# Patient Record
Sex: Female | Born: 1960 | Race: Black or African American | Marital: Married | Smoking: Never smoker
Health system: Southern US, Community
[De-identification: ages and names within clinical notes are randomized; demographics above are authoritative.]

## PROBLEM LIST (undated history)

## (undated) DIAGNOSIS — R519 Headache, unspecified: Secondary | ICD-10-CM

## (undated) DIAGNOSIS — S0990XA Unspecified injury of head, initial encounter: Secondary | ICD-10-CM

## (undated) DIAGNOSIS — I10 Essential (primary) hypertension: Secondary | ICD-10-CM

## (undated) DIAGNOSIS — R7302 Impaired glucose tolerance (oral): Secondary | ICD-10-CM

## (undated) DIAGNOSIS — M542 Cervicalgia: Secondary | ICD-10-CM

## (undated) DIAGNOSIS — D509 Iron deficiency anemia, unspecified: Secondary | ICD-10-CM

## (undated) DIAGNOSIS — F419 Anxiety disorder, unspecified: Secondary | ICD-10-CM

## (undated) DIAGNOSIS — Z8709 Personal history of other diseases of the respiratory system: Secondary | ICD-10-CM

## (undated) DIAGNOSIS — E669 Obesity, unspecified: Secondary | ICD-10-CM

## (undated) DIAGNOSIS — M1712 Unilateral primary osteoarthritis, left knee: Secondary | ICD-10-CM

## (undated) DIAGNOSIS — R51 Headache: Secondary | ICD-10-CM

## (undated) DIAGNOSIS — N3000 Acute cystitis without hematuria: Secondary | ICD-10-CM

## (undated) DIAGNOSIS — R300 Dysuria: Secondary | ICD-10-CM

## (undated) DIAGNOSIS — M549 Dorsalgia, unspecified: Secondary | ICD-10-CM

## (undated) HISTORY — DX: Iron deficiency anemia, unspecified: D50.9

## (undated) HISTORY — DX: Personal history of other diseases of the respiratory system: Z87.09

## (undated) HISTORY — DX: Acute cystitis without hematuria: N30.00

## (undated) HISTORY — PX: CHOLECYSTECTOMY: SHX55

## (undated) HISTORY — DX: Headache: R51

## (undated) HISTORY — DX: Obesity, unspecified: E66.9

## (undated) HISTORY — DX: Impaired glucose tolerance (oral): R73.02

## (undated) HISTORY — DX: Unspecified injury of head, initial encounter: S09.90XA

## (undated) HISTORY — PX: ABDOMINAL HYSTERECTOMY: SHX81

## (undated) HISTORY — DX: Dorsalgia, unspecified: M54.9

## (undated) HISTORY — DX: Dysuria: R30.0

## (undated) HISTORY — DX: Headache, unspecified: R51.9

## (undated) HISTORY — DX: Cervicalgia: M54.2

## (undated) HISTORY — DX: Unilateral primary osteoarthritis, left knee: M17.12

---

## 1998-05-17 ENCOUNTER — Encounter: Admission: RE | Admit: 1998-05-17 | Discharge: 1998-08-15 | Payer: Self-pay | Admitting: Anesthesiology

## 1998-11-25 ENCOUNTER — Encounter: Admission: RE | Admit: 1998-11-25 | Discharge: 1999-02-23 | Payer: Self-pay | Admitting: Anesthesiology

## 2001-02-21 ENCOUNTER — Other Ambulatory Visit: Admission: RE | Admit: 2001-02-21 | Discharge: 2001-02-21 | Payer: Self-pay | Admitting: Family Medicine

## 2001-03-06 ENCOUNTER — Ambulatory Visit (HOSPITAL_COMMUNITY): Admission: RE | Admit: 2001-03-06 | Discharge: 2001-03-06 | Payer: Self-pay | Admitting: Family Medicine

## 2001-03-06 ENCOUNTER — Encounter: Payer: Self-pay | Admitting: Family Medicine

## 2001-04-01 ENCOUNTER — Encounter: Payer: Self-pay | Admitting: Family Medicine

## 2001-04-01 ENCOUNTER — Ambulatory Visit (HOSPITAL_COMMUNITY): Admission: RE | Admit: 2001-04-01 | Discharge: 2001-04-01 | Payer: Self-pay | Admitting: Family Medicine

## 2001-06-16 ENCOUNTER — Encounter
Admission: RE | Admit: 2001-06-16 | Discharge: 2001-09-14 | Payer: Self-pay | Admitting: Physical Medicine & Rehabilitation

## 2001-09-15 ENCOUNTER — Encounter
Admission: RE | Admit: 2001-09-15 | Discharge: 2001-10-15 | Payer: Self-pay | Admitting: Physical Medicine & Rehabilitation

## 2001-10-14 ENCOUNTER — Encounter: Payer: Self-pay | Admitting: Family Medicine

## 2001-10-14 ENCOUNTER — Ambulatory Visit (HOSPITAL_COMMUNITY): Admission: RE | Admit: 2001-10-14 | Discharge: 2001-10-14 | Payer: Self-pay | Admitting: Family Medicine

## 2002-10-14 ENCOUNTER — Encounter: Payer: Self-pay | Admitting: Orthopaedic Surgery

## 2002-10-14 ENCOUNTER — Ambulatory Visit (HOSPITAL_COMMUNITY): Admission: RE | Admit: 2002-10-14 | Discharge: 2002-10-14 | Payer: Self-pay | Admitting: Orthopaedic Surgery

## 2003-08-30 ENCOUNTER — Ambulatory Visit (HOSPITAL_COMMUNITY): Admission: RE | Admit: 2003-08-30 | Discharge: 2003-08-30 | Payer: Self-pay | Admitting: Family Medicine

## 2004-02-03 ENCOUNTER — Ambulatory Visit (HOSPITAL_COMMUNITY): Admission: RE | Admit: 2004-02-03 | Discharge: 2004-02-03 | Payer: Self-pay | Admitting: Family Medicine

## 2004-02-23 ENCOUNTER — Ambulatory Visit (HOSPITAL_COMMUNITY): Admission: RE | Admit: 2004-02-23 | Discharge: 2004-02-23 | Payer: Self-pay | Admitting: Family Medicine

## 2004-07-04 ENCOUNTER — Ambulatory Visit: Payer: Self-pay | Admitting: Family Medicine

## 2004-10-16 ENCOUNTER — Ambulatory Visit: Payer: Self-pay | Admitting: Family Medicine

## 2004-10-16 LAB — CONVERTED CEMR LAB: Hgb A1c MFr Bld: 6.1 %

## 2004-10-19 ENCOUNTER — Ambulatory Visit (HOSPITAL_COMMUNITY): Admission: RE | Admit: 2004-10-19 | Discharge: 2004-10-19 | Payer: Self-pay | Admitting: Family Medicine

## 2004-12-25 ENCOUNTER — Ambulatory Visit: Payer: Self-pay | Admitting: Family Medicine

## 2005-04-24 ENCOUNTER — Ambulatory Visit: Payer: Self-pay | Admitting: Family Medicine

## 2005-05-01 ENCOUNTER — Ambulatory Visit (HOSPITAL_COMMUNITY): Admission: RE | Admit: 2005-05-01 | Discharge: 2005-05-01 | Payer: Self-pay | Admitting: Family Medicine

## 2005-11-13 ENCOUNTER — Encounter: Payer: Self-pay | Admitting: Family Medicine

## 2005-11-13 ENCOUNTER — Ambulatory Visit: Payer: Self-pay | Admitting: Family Medicine

## 2005-11-13 ENCOUNTER — Other Ambulatory Visit: Admission: RE | Admit: 2005-11-13 | Discharge: 2005-11-13 | Payer: Self-pay | Admitting: Family Medicine

## 2006-01-16 ENCOUNTER — Ambulatory Visit: Payer: Self-pay | Admitting: Family Medicine

## 2006-01-24 ENCOUNTER — Ambulatory Visit (HOSPITAL_COMMUNITY): Admission: RE | Admit: 2006-01-24 | Discharge: 2006-01-24 | Payer: Self-pay | Admitting: Family Medicine

## 2006-03-14 ENCOUNTER — Ambulatory Visit (HOSPITAL_COMMUNITY): Admission: RE | Admit: 2006-03-14 | Discharge: 2006-03-14 | Payer: Self-pay | Admitting: Obstetrics and Gynecology

## 2006-03-25 HISTORY — PX: PARTIAL HYSTERECTOMY: SHX80

## 2006-04-18 ENCOUNTER — Ambulatory Visit: Payer: Self-pay | Admitting: Family Medicine

## 2006-04-22 ENCOUNTER — Ambulatory Visit: Payer: Self-pay | Admitting: Family Medicine

## 2006-04-30 ENCOUNTER — Encounter (HOSPITAL_COMMUNITY): Admission: RE | Admit: 2006-04-30 | Discharge: 2006-05-30 | Payer: Self-pay | Admitting: Family Medicine

## 2006-08-19 ENCOUNTER — Ambulatory Visit: Payer: Self-pay | Admitting: Family Medicine

## 2006-08-19 LAB — CONVERTED CEMR LAB
Albumin: 3.8 g/dL (ref 3.5–5.2)
Alkaline Phosphatase: 80 units/L (ref 39–117)
BUN: 8 mg/dL
BUN: 8 mg/dL (ref 6–23)
CO2: 25 meq/L
CO2: 25 meq/L (ref 19–32)
Calcium: 9 mg/dL
Calcium: 9 mg/dL (ref 8.4–10.5)
Chloride: 107 meq/L (ref 96–112)
Creatinine, Ser: 0.72 mg/dL (ref 0.40–1.20)
Eosinophils Absolute: 0.2 10*3/uL (ref 0.0–0.7)
Eosinophils Relative: 2 % (ref 0–5)
Ferritin: 14 ng/mL (ref 10–291)
Glucose, Bld: 91 mg/dL (ref 70–99)
HCT: 35.1 % — ABNORMAL LOW (ref 36.0–46.0)
HDL: 38 mg/dL — ABNORMAL LOW (ref >39)
Hemoglobin: 10.9 g/dL — ABNORMAL LOW (ref 12.0–15.0)
LDL Cholesterol: 87 mg/dL (ref 0–99)
Lymphocytes Relative: 27 % (ref 12–46)
Lymphs Abs: 2 10*3/uL (ref 0.7–3.3)
MCHC: 31.1 g/dL (ref 30.0–36.0)
Monocytes Relative: 7 % (ref 3–11)
Potassium: 4.3 meq/L (ref 3.5–5.3)
RBC: 4.37 M/uL (ref 3.87–5.11)
Saturation Ratios: 12 % — ABNORMAL LOW (ref 20–55)
Sodium: 143 meq/L
TSH: 1.23 microintl units/mL (ref 0.350–5.50)
Total Bilirubin: 0.3 mg/dL (ref 0.3–1.2)
Total CHOL/HDL Ratio: 3.9
Triglycerides: 113 mg/dL (ref <150)
UIBC: 276 ug/dL

## 2006-08-20 ENCOUNTER — Ambulatory Visit (HOSPITAL_COMMUNITY): Admission: RE | Admit: 2006-08-20 | Discharge: 2006-08-20 | Payer: Self-pay | Admitting: Family Medicine

## 2006-08-20 LAB — HM MAMMOGRAPHY: HM Mammogram: NORMAL

## 2006-09-21 ENCOUNTER — Emergency Department (HOSPITAL_COMMUNITY): Admission: EM | Admit: 2006-09-21 | Discharge: 2006-09-22 | Payer: Self-pay | Admitting: Emergency Medicine

## 2006-09-24 ENCOUNTER — Ambulatory Visit: Payer: Self-pay | Admitting: Family Medicine

## 2006-10-12 ENCOUNTER — Encounter: Payer: Self-pay | Admitting: Family Medicine

## 2006-10-19 ENCOUNTER — Encounter: Payer: Self-pay | Admitting: Family Medicine

## 2006-11-14 ENCOUNTER — Encounter (INDEPENDENT_AMBULATORY_CARE_PROVIDER_SITE_OTHER): Payer: Self-pay | Admitting: *Deleted

## 2006-11-14 ENCOUNTER — Other Ambulatory Visit: Admission: RE | Admit: 2006-11-14 | Discharge: 2006-11-14 | Payer: Self-pay | Admitting: Family Medicine

## 2006-11-14 ENCOUNTER — Ambulatory Visit: Payer: Self-pay | Admitting: Family Medicine

## 2006-11-14 ENCOUNTER — Encounter: Payer: Self-pay | Admitting: Family Medicine

## 2006-11-14 LAB — CONVERTED CEMR LAB
Basophils Absolute: 0 10*3/uL (ref 0.0–0.1)
Eosinophils Absolute: 0.1 10*3/uL (ref 0.0–0.7)
HCT: 35.3 % — ABNORMAL LOW (ref 36.0–46.0)
Hemoglobin: 11.3 g/dL — ABNORMAL LOW (ref 12.0–15.0)
Lymphocytes Relative: 32 % (ref 12–46)
Monocytes Relative: 7 % (ref 3–11)
Neutro Abs: 3.7 10*3/uL (ref 1.7–7.7)
Neutrophils Relative %: 59 % (ref 43–77)
RBC: 4.5 M/uL (ref 3.87–5.11)
RDW: 15.3 % — ABNORMAL HIGH (ref 11.5–14.0)
Retic Ct Pct: 1.1 % (ref 0.4–3.1)
Saturation Ratios: 15 % — ABNORMAL LOW (ref 20–55)
TIBC: 276 ug/dL (ref 250–470)
UIBC: 235 ug/dL
Vitamin B-12: 299 pg/mL (ref 211–911)
WBC: 6.2 10*3/uL (ref 4.0–10.5)

## 2006-11-15 ENCOUNTER — Encounter: Payer: Self-pay | Admitting: Family Medicine

## 2006-11-15 LAB — CONVERTED CEMR LAB
GC Probe Amp, Genital: NEGATIVE
Trichomonal Vaginitis: NEGATIVE

## 2006-12-23 ENCOUNTER — Ambulatory Visit: Payer: Self-pay | Admitting: Family Medicine

## 2006-12-23 LAB — CONVERTED CEMR LAB
Bilirubin Urine: NEGATIVE
Nitrite: POSITIVE
Specific Gravity, Urine: 1.01

## 2006-12-24 ENCOUNTER — Encounter: Payer: Self-pay | Admitting: Family Medicine

## 2007-07-08 ENCOUNTER — Encounter: Payer: Self-pay | Admitting: Family Medicine

## 2007-07-08 DIAGNOSIS — IMO0002 Reserved for concepts with insufficient information to code with codable children: Secondary | ICD-10-CM

## 2007-07-08 DIAGNOSIS — Z9189 Other specified personal risk factors, not elsewhere classified: Secondary | ICD-10-CM | POA: Insufficient documentation

## 2007-07-08 DIAGNOSIS — M549 Dorsalgia, unspecified: Secondary | ICD-10-CM | POA: Insufficient documentation

## 2007-07-08 DIAGNOSIS — E66811 Obesity, class 1: Secondary | ICD-10-CM | POA: Insufficient documentation

## 2007-07-08 DIAGNOSIS — E669 Obesity, unspecified: Secondary | ICD-10-CM | POA: Insufficient documentation

## 2007-07-08 DIAGNOSIS — D509 Iron deficiency anemia, unspecified: Secondary | ICD-10-CM

## 2007-07-08 DIAGNOSIS — J309 Allergic rhinitis, unspecified: Secondary | ICD-10-CM | POA: Insufficient documentation

## 2007-07-08 DIAGNOSIS — M1710 Unilateral primary osteoarthritis, unspecified knee: Secondary | ICD-10-CM | POA: Insufficient documentation

## 2007-07-16 ENCOUNTER — Ambulatory Visit: Payer: Self-pay | Admitting: Family Medicine

## 2007-07-18 ENCOUNTER — Encounter: Payer: Self-pay | Admitting: Family Medicine

## 2007-08-26 ENCOUNTER — Ambulatory Visit: Payer: Self-pay | Admitting: Family Medicine

## 2007-08-26 LAB — CONVERTED CEMR LAB: GC Probe Amp, Genital: NEGATIVE

## 2007-08-27 ENCOUNTER — Encounter: Payer: Self-pay | Admitting: Family Medicine

## 2007-08-27 LAB — CONVERTED CEMR LAB: Candida species: POSITIVE — AB

## 2007-09-09 ENCOUNTER — Encounter (INDEPENDENT_AMBULATORY_CARE_PROVIDER_SITE_OTHER): Payer: Self-pay | Admitting: *Deleted

## 2007-12-29 ENCOUNTER — Encounter: Payer: Self-pay | Admitting: Family Medicine

## 2007-12-29 ENCOUNTER — Ambulatory Visit: Payer: Self-pay | Admitting: Family Medicine

## 2007-12-29 LAB — CONVERTED CEMR LAB
GC Probe Amp, Genital: NEGATIVE
Glucose, Urine, Semiquant: NEGATIVE
Ketones, urine, test strip: NEGATIVE
OCCULT 1: NEGATIVE
Protein, U semiquant: NEGATIVE
Specific Gravity, Urine: 1.015
WBC Urine, dipstick: NEGATIVE
pH: 5.5

## 2007-12-30 ENCOUNTER — Encounter: Payer: Self-pay | Admitting: Family Medicine

## 2007-12-30 LAB — CONVERTED CEMR LAB
Candida species: NEGATIVE
Trichomonal Vaginitis: NEGATIVE

## 2008-01-01 ENCOUNTER — Encounter: Payer: Self-pay | Admitting: Family Medicine

## 2008-01-01 ENCOUNTER — Ambulatory Visit (HOSPITAL_COMMUNITY): Admission: RE | Admit: 2008-01-01 | Discharge: 2008-01-01 | Payer: Self-pay | Admitting: Family Medicine

## 2008-01-01 LAB — CONVERTED CEMR LAB: Pap Smear: ABNORMAL

## 2008-01-08 ENCOUNTER — Encounter: Payer: Self-pay | Admitting: Family Medicine

## 2008-01-19 ENCOUNTER — Ambulatory Visit (HOSPITAL_COMMUNITY): Admission: RE | Admit: 2008-01-19 | Discharge: 2008-01-19 | Payer: Self-pay | Admitting: Family Medicine

## 2008-05-13 ENCOUNTER — Telehealth: Payer: Self-pay | Admitting: Family Medicine

## 2008-05-13 ENCOUNTER — Ambulatory Visit: Payer: Self-pay | Admitting: Family Medicine

## 2008-05-13 LAB — CONVERTED CEMR LAB
Bilirubin Urine: NEGATIVE
Eosinophils Absolute: 0.2 10*3/uL (ref 0.0–0.7)
Glucose, Urine, Semiquant: NEGATIVE
Hemoglobin: 13 g/dL (ref 12.0–15.0)
Lymphocytes Relative: 11 % — ABNORMAL LOW (ref 12–46)
MCV: 82.5 fL (ref 78.0–100.0)
Monocytes Relative: 8 % (ref 3–12)
WBC: 15.4 10*3/uL — ABNORMAL HIGH (ref 4.0–10.5)

## 2008-05-14 ENCOUNTER — Ambulatory Visit: Payer: Self-pay | Admitting: Family Medicine

## 2008-05-15 ENCOUNTER — Encounter: Payer: Self-pay | Admitting: Family Medicine

## 2008-05-17 ENCOUNTER — Encounter: Payer: Self-pay | Admitting: Family Medicine

## 2008-05-18 LAB — CONVERTED CEMR LAB
Basophils Absolute: 0.1 10*3/uL (ref 0.0–0.1)
Eosinophils Absolute: 0.2 10*3/uL (ref 0.0–0.7)
Eosinophils Relative: 2 % (ref 0–5)
Lymphs Abs: 2.5 10*3/uL (ref 0.7–4.0)
MCV: 81.8 fL (ref 78.0–100.0)
Neutrophils Relative %: 56 % (ref 43–77)
Platelets: 345 10*3/uL (ref 150–400)
RDW: 14.1 % (ref 11.5–15.5)
WBC: 7.9 10*3/uL (ref 4.0–10.5)

## 2008-05-27 ENCOUNTER — Encounter: Payer: Self-pay | Admitting: Family Medicine

## 2008-10-27 ENCOUNTER — Ambulatory Visit: Payer: Self-pay | Admitting: Family Medicine

## 2008-10-28 LAB — CONVERTED CEMR LAB: Retic Ct Pct: 1.1 % (ref 0.4–3.1)

## 2008-11-09 ENCOUNTER — Ambulatory Visit: Payer: Self-pay | Admitting: Family Medicine

## 2008-11-09 LAB — CONVERTED CEMR LAB: Hgb A1c MFr Bld: 7 %

## 2008-11-11 ENCOUNTER — Ambulatory Visit: Payer: Self-pay | Admitting: Family Medicine

## 2008-11-11 DIAGNOSIS — E1159 Type 2 diabetes mellitus with other circulatory complications: Secondary | ICD-10-CM

## 2008-11-11 LAB — CONVERTED CEMR LAB
Ketones, urine, test strip: NEGATIVE
Nitrite: NEGATIVE
Urobilinogen, UA: 0.2
pH: 5.5

## 2008-11-12 ENCOUNTER — Encounter: Payer: Self-pay | Admitting: Family Medicine

## 2009-01-11 ENCOUNTER — Encounter: Payer: Self-pay | Admitting: Family Medicine

## 2009-01-11 ENCOUNTER — Ambulatory Visit: Payer: Self-pay | Admitting: Family Medicine

## 2009-01-11 ENCOUNTER — Other Ambulatory Visit: Admission: RE | Admit: 2009-01-11 | Discharge: 2009-01-11 | Payer: Self-pay | Admitting: Family Medicine

## 2009-01-11 LAB — CONVERTED CEMR LAB: Glucose, Bld: 123 mg/dL

## 2009-01-12 ENCOUNTER — Encounter: Payer: Self-pay | Admitting: Family Medicine

## 2009-01-12 LAB — CONVERTED CEMR LAB
Chlamydia, DNA Probe: NEGATIVE
GC Probe Amp, Genital: NEGATIVE

## 2009-08-29 ENCOUNTER — Encounter: Payer: Self-pay | Admitting: Family Medicine

## 2010-01-02 ENCOUNTER — Ambulatory Visit: Payer: Self-pay | Admitting: Family Medicine

## 2010-01-02 DIAGNOSIS — R5383 Other fatigue: Secondary | ICD-10-CM

## 2010-01-02 DIAGNOSIS — M542 Cervicalgia: Secondary | ICD-10-CM

## 2010-01-02 DIAGNOSIS — R5381 Other malaise: Secondary | ICD-10-CM

## 2010-01-02 HISTORY — DX: Cervicalgia: M54.2

## 2010-01-03 ENCOUNTER — Telehealth: Payer: Self-pay | Admitting: Family Medicine

## 2010-01-03 LAB — CONVERTED CEMR LAB
BUN: 13 mg/dL (ref 6–23)
Cholesterol: 158 mg/dL (ref 0–200)
Eosinophils Absolute: 0.1 10*3/uL (ref 0.0–0.7)
Eosinophils Relative: 2 % (ref 0–5)
Glucose, Bld: 105 mg/dL — ABNORMAL HIGH (ref 70–99)
HCT: 38.9 % (ref 36.0–46.0)
Hemoglobin: 12.4 g/dL (ref 12.0–15.0)
Hgb A1c MFr Bld: 7.3 % — ABNORMAL HIGH (ref <5.7)
Lymphs Abs: 2.5 10*3/uL (ref 0.7–4.0)
MCV: 82.6 fL (ref 78.0–100.0)
Monocytes Relative: 7 % (ref 3–12)
Neutrophils Relative %: 57 % (ref 43–77)
Potassium: 4.8 meq/L (ref 3.5–5.3)
RBC: 4.71 M/uL (ref 3.87–5.11)
VLDL: 20 mg/dL (ref 0–40)
WBC: 7.5 10*3/uL (ref 4.0–10.5)

## 2010-03-06 ENCOUNTER — Ambulatory Visit: Payer: Self-pay | Admitting: Family Medicine

## 2010-03-06 LAB — CONVERTED CEMR LAB: Glucose, Bld: 118 mg/dL

## 2010-03-07 ENCOUNTER — Encounter: Payer: Self-pay | Admitting: Family Medicine

## 2010-03-07 LAB — CONVERTED CEMR LAB: Microalb Creat Ratio: 6.6 mg/g (ref 0.0–30.0)

## 2010-04-13 ENCOUNTER — Encounter: Payer: Self-pay | Admitting: Family Medicine

## 2010-04-13 LAB — CONVERTED CEMR LAB
BUN: 11 mg/dL (ref 6–23)
CO2: 27 meq/L (ref 19–32)
Calcium: 9.1 mg/dL (ref 8.4–10.5)
Chloride: 105 meq/L (ref 96–112)
Creatinine, Ser: 0.89 mg/dL (ref 0.40–1.20)
Glucose, Bld: 80 mg/dL (ref 70–99)

## 2010-04-17 ENCOUNTER — Ambulatory Visit: Payer: Self-pay | Admitting: Family Medicine

## 2010-04-17 ENCOUNTER — Other Ambulatory Visit: Admission: RE | Admit: 2010-04-17 | Discharge: 2010-04-17 | Payer: Self-pay | Admitting: Family Medicine

## 2010-04-17 DIAGNOSIS — F5105 Insomnia due to other mental disorder: Secondary | ICD-10-CM

## 2010-04-17 DIAGNOSIS — F419 Anxiety disorder, unspecified: Secondary | ICD-10-CM

## 2010-04-17 LAB — HM PAP SMEAR

## 2010-04-21 ENCOUNTER — Encounter: Payer: Self-pay | Admitting: Family Medicine

## 2010-07-15 ENCOUNTER — Encounter: Payer: Self-pay | Admitting: Family Medicine

## 2010-07-16 ENCOUNTER — Encounter: Payer: Self-pay | Admitting: Family Medicine

## 2010-07-17 ENCOUNTER — Encounter: Payer: Self-pay | Admitting: Family Medicine

## 2010-07-25 NOTE — Miscellaneous (Signed)
Summary: REFILL  Clinical Lists Changes  Medications: Rx of AMITRIPTYLINE HCL 25 MG  TABS (AMITRIPTYLINE HCL) 1 by mouth two times a day;  #60 x 2;  Signed;  Entered by: Worthy Keeler LPN;  Authorized by: [name] MD;  Method used: Electronically to [facility] [phone]*, [address], [place], [place], [place]  [address], Ph: [phone] or [phone], Fax: [phone]    Prescriptions: AMITRIPTYLINE HCL 25 MG  TABS (AMITRIPTYLINE HCL) 1 by mouth two times a day  #60 x 2   Entered by:   Worthy Keeler LPN   Authorized by:   [name] MD   Signed by:   Worthy Keeler LPN on 23/55/7322   Method used:   Electronically to        [facility] [phone]* (retail)       [address]       [place], [place]  [address]       Ph: [phone] or [phone]       Fax: [phone]   RxID:   [id number]

## 2010-07-25 NOTE — Assessment & Plan Note (Signed)
Summary: office visit   Vital Signs:  Patient profile:   50 year old female Menstrual status:  hysterectomy Height:      66 inches Weight:      204.75 pounds BMI:     33.17 O2 Sat:      99 % on Room air Pulse rate:   102 / minute Pulse rhythm:   regular Resp:     16 per minute BP sitting:   122 / 74  (left arm)  Vitals Entered By: Mauricia Area CMA (April 17, 2010 2:30 PM)  Nutrition Counseling: Patient's BMI is greater than 25 and therefore counseled on weight management options.  O2 Flow:  Room air CC: follow up   CC:  follow up.  History of Present Illness: Reports  thatshe has been doing well. Denies recent fever or chills. Denies sinus pressure, nasal congestion , ear pain or sore throat. Denies chest congestion, or cough productive of sputum. Denies chest pain, palpitations, PND, orthopnea or leg swelling. Denies abdominal pain, nausea, vomitting, diarrhea or constipation. Denies change in bowel movements or bloody stool. Denies dysuria , frequency, incontinence or hesitancy.  Denies headaches, vertigo, seizures. Denies depression, anxiety or insomnia. Denies  rash, lesions, or itch.     Current Medications (verified): 1)  Gabapentin 800 Mg  Tabs (Gabapentin) .Marland Kitchen.. 1 By Mouth Two Times A Day 2)  Baclofen 20 Mg  Tabs (Baclofen) .Marland Kitchen.. 1 By Mouth Two Times A Day 3)  Amitriptyline Hcl 25 Mg  Tabs (Amitriptyline Hcl) .Marland Kitchen.. 1 By Mouth Two Times A Day 4)  Meloxicam 15 Mg  Tabs (Meloxicam) .... One Tab By Mouth Twice Daily 5)  Metformin Hcl 1000 Mg Tabs (Metformin Hcl) .... Take 1 Tablet By Mouth Two Times A Day  Allergies (verified): 1)  ! Sulfa  Review of Systems      See HPI General:  Complains of fatigue. Eyes:  Denies blurring, discharge, eye pain, and red eye. MS:  Complains of low back pain, mid back pain, and stiffness. Endo:  Denies cold intolerance, excessive thirst, excessive urination, and heat intolerance. Heme:  Denies abnormal bruising and  bleeding. Allergy:  Denies hives or rash and itching eyes.  Physical Exam  General:  Well-developed,obese,in no acute distress; alert,appropriate and cooperative throughout examination Head:  Normocephalic and atraumatic without obvious abnormalities. No apparent alopecia or balding. Eyes:  vision grossly intact, pupils equal, pupils round, and pupils reactive to light.   Ears:  External ear exam shows no significant lesions or deformities.  Otoscopic examination reveals clear canals, tympanic membranes are intact bilaterally without bulging, retraction, inflammation or discharge. Hearing is grossly normal bilaterally. Nose:  External nasal examination shows no deformity or inflammation. Nasal mucosa are pink and moist without lesions or exudates. Mouth:  Oral mucosa and oropharynx without lesions or exudates.  Teeth in good repair. Neck:  No deformities, masses, or tenderness noted. Chest Wall:  No deformities, masses, or tenderness noted. Breasts:  No mass, nodules, thickening, tenderness, bulging, retraction, inflamation, nipple discharge or skin changes noted.   Lungs:  Normal respiratory effort, chest expands symmetrically. Lungs are clear to auscultation, no crackles or wheezes. Heart:  Normal rate and regular rhythm. S1 and S2 normal without gallop, murmur, click, rub or other extra sounds. Abdomen:  Bowel sounds positive,abdomen soft and non-tender without masses, organomegaly or hernias noted. Rectal:  No external abnormalities noted. Normal sphincter tone. No rectal masses or tenderness. Genitalia:  normal introitus, no external lesions, no vaginal discharge, no vaginal  atrophy, and no adnexal masses or tenderness.  Uterus absent Msk:  No deformity or scoliosis noted of thoracic or lumbar spine.   Pulses:  R and L carotid,radial,femoral,dorsalis pedis and posterior tibial pulses are full and equal bilaterally Extremities:  No clubbing, cyanosis, edema, or deformity noted withdecrreased  ROM spine, normal in other large joints Neurologic:  alert & oriented X3, cranial nerves II-XII intact, strength normal in all extremities, sensation intact to pinprick, gait normal, and finger-to-nose normal.   Skin:  Intact without suspicious lesions or rashes Cervical Nodes:  No lymphadenopathy noted Axillary Nodes:  No palpable lymphadenopathy Inguinal Nodes:  No significant adenopathy Psych:  Cognition and judgment appear intact. Alert and cooperative with normal attention span and concentration. No apparent delusions, illusions, hallucinations   Impression & Recommendations:  Problem # 1:  INSOMNIA (ICD-780.52) Assessment Unchanged  Orders: Medicare Electronic Prescription [phone]) continue amitryptaline at bedtime Discussed sleep hygiene.   Problem # 2:  DEPRESSION (ICD-311) Assessment: Improved  The following medications were removed from the medication list:    Amitriptyline Hcl 25 Mg Tabs (Amitriptyline hcl) .Marland Kitchen... 1 by mouth two times a day Her updated medication list for this problem includes:    Amitriptyline Hcl 50 Mg Tabs (Amitriptyline hcl) .Marland Kitchen... Take 1 tab by mouth at bedtime  Problem # 3:  DIABETES MELLITUS (ICD-250.00) Assessment: Improved  Her updated medication list for this problem includes:    Metformin Hcl 1000 Mg Tabs (Metformin hcl) .Marland Kitchen... Take 1 tablet by mouth two times a day pt applauded on her success, and if she continues this way, will have the dx dropped Orders: T- Hemoglobin A1C ([id number])  Labs Reviewed: Creat: 0.89 (04/13/2010)    Reviewed HgBA1c results: 5.9 (04/13/2010)  7.3 (01/02/2010)  Problem # 4:  OBESITY (ICD-278.00) Assessment: Improved  Ht: 66 (04/17/2010)   Wt: 204.75 (04/17/2010)   BMI: 33.17 (04/17/2010) therapeutic lifestyle change discussed and encouraged  Problem # 5:  Preventive Health Care (ICD-V70.0) Assessment: Comment Only need for colonscopy discussed , she is avg risk  Complete Medication List: 1)   Gabapentin 800 Mg Tabs (Gabapentin) .Marland Kitchen.. 1 by mouth two times a day 2)  Baclofen 20 Mg Tabs (Baclofen) .Marland Kitchen.. 1 by mouth two times a day 3)  Meloxicam 15 Mg Tabs (Meloxicam) .... One tab by mouth twice daily 4)  Metformin Hcl 1000 Mg Tabs (Metformin hcl) .... Take 1 tablet by mouth two times a day 5)  Amitriptyline Hcl 50 Mg Tabs (Amitriptyline hcl) .... Take 1 tab by mouth at bedtime  Other Orders: Gastroenterology Referral (GI) Pap Smear (91478) Hemoccult Guaiac-1 spec.(in office) ([address]562)  Patient Instructions: 1)  Please schedule a follow-up appointment in 4 months. 2)  It is important that you exercise regularly at least 40 minutes 5 times a week. If you develop chest pain, have severe difficulty breathing, or feel very tired , stop exercising immediately and seek medical attention. 3)  You need to lose weight. Consider a lower calorie diet and regular exercise.  4)  HbgA1C prior to visit, ICD-9: Prescriptions: AMITRIPTYLINE HCL 50 MG TABS (AMITRIPTYLINE HCL) Take 1 tab by mouth at bedtime  #90 x 1   Entered and Authorized by:   [name] MD   Signed by:   [name] MD on 04/17/2010   Method used:   Printed then faxed to ...       [address] [facility] [phone]* (retail)       [address]  [place], [place]  [address]       Ph: [phone] or [phone]       Fax: [phone]   RxID:   [id number]    Orders Added: 1)  Est. Patient 40-64 years [99396] 2)  Medicare Electronic Prescription [G8553] 3)  Gastroenterology Referral [GI] 4)  T- Hemoglobin A1C [83036-23375] 5)  Pap Smear [88150] 6)  Hemoccult Guaiac-1 spec.(in office) [82270]    Laboratory Results  Date/Time Received: April 17, 2010 3:46 PM  Date/Time Reported: April 17, 2010 3:46 PM   Stool - Occult Blood Hemmoccult #1: negative Date: 04/17/2010 Comments: 50590 1L 03/12 5030 5/14 Adella Hare LPN  April 17, 2010 3:46 PM

## 2010-07-25 NOTE — Progress Notes (Signed)
Summary: EYE DOCTORS  EYE DOCTORS   Imported By: Lind Guest 08/29/2009 15:03:29  _____________________________________________________________________  External Attachment:    Type:   Image     Comment:   External Document

## 2010-07-25 NOTE — Letter (Signed)
Summary: Laboratory/X-Ray Results  [facility]  [address]   [place], [place] [address]   Phone: [phone]  Fax: [phone]    Lab/X-Ray Results  April 21, 2010  MRN: [id number]  Brittany Nunez [address] [place], [place]  [address]    The results of your recent pap smear has been reviewed and were found: NORMAL    If you have any questions, please contact our office.     Adella Hare LPN

## 2010-07-25 NOTE — Assessment & Plan Note (Signed)
Summary: ov   Vital Signs:  Patient profile:   50 year old female Menstrual status:  hysterectomy Height:      66 inches Weight:      218.75 pounds BMI:     35.43 O2 Sat:      96 % Pulse rate:   104 / minute Pulse rhythm:   regular Resp:     16 per minute BP sitting:   110 / 70  (left arm) Cuff size:   large  Vitals Entered By: Everitt Amber LPN (January 02, 2010 1:41 PM)  Nutrition Counseling: Patient's BMI is greater than 25 and therefore counseled on weight management options. CC: Follow up chronic problems, left shoulder pain since Friday Pain Assessment Patient in pain? yes     Location: left shoulder Intensity: 8 Type: aching Onset of pain  varies with intensity since friday   CC:  Follow up chronic problems and left shoulder pain since Friday.  History of Present Illness: Pt comes in after a 1 year hiatus stating she really has deteriorated over time. She reports "uncontrolled menopausal symptoms", excessive sweating, fatigue, iritability, depression. States the only thing that makes her happy in life is her grand -daughter. She would lie in bed all day if she could, she pushes herself to do what is necessary for her family, she cries daily and often wonders about the purpose of her life.She denies suicidal or homicidal ideation or halklucinations. She can identify no specific triggers for this decompensation and has not been treated for depression before.she does feel therapy will help as she "keeps things to herself"Stattes her family is very concerned about her and her adult daughter accompanies her and agrees. She c/o increased left neck pain and spasm, has been out of her meds. She has neither been takin her blood sugar meds or testing, but state s she is now readfy to start.  Current Medications (verified): 1)  Gabapentin 800 Mg  Tabs (Gabapentin) .Marland Kitchen.. 1 By Mouth Three Times A Day 2)  Baclofen 20 Mg  Tabs (Baclofen) .Marland Kitchen.. 1 By Mouth Three Times A Day 3)   Amitriptyline Hcl 25 Mg  Tabs (Amitriptyline Hcl) .Marland Kitchen.. 1 By Mouth Two Times A Day 4)  Meloxicam 15 Mg  Tabs (Meloxicam) .... One Tab By Mouth Once Daily 5)  Metformin Hcl 500 Mg Tabs (Metformin Hcl) .... One Tab By Mouth Bid  Allergies (verified): 1)  ! Sulfa  Review of Systems      See HPI General:  Complains of fatigue, sleep disorder, and sweats; progressive in the past 1 year. Eyes:  Denies blurring, discharge, eye pain, and red eye. ENT:  Denies hoarseness, nasal congestion, and sinus pressure. CV:  Denies chest pain or discomfort, difficulty breathing while lying down, palpitations, and swelling of feet. Resp:  Denies cough and sputum productive. GI:  Denies abdominal pain, constipation, diarrhea, nausea, and vomiting. GU:  Complains of urinary frequency; denies dysuria. MS:  left neck and pain spasm x 4 days. Derm:  Complains of hair loss; denies itching, lesion(s), and rash; increased hair loss over the past 6 to 8 months, still using chemicals in the hair. Neuro:  Complains of memory loss. Psych:  Complains of depression, easily tearful, irritability, and mental problems; denies suicidal thoughts/plans, thoughts of violence, unusual visions or sounds, and thoughts /plans of harming others; 1 year h/o moodinstability, tearfukllness, social withdrawal and anhidonia. Endo:  Complains of excessive thirst and excessive urination; not testing blood sugars regulalrly. Allergy:  Complains of  seasonal allergies; denies hives or rash and itching eyes; runny nose primarily in the Spring and Summer.  Physical Exam  General:  Well-developed,obese,in no acute distress; alert,appropriate and cooperative throughout examination HEENT: No facial asymmetry,  EOMI, No sinus tenderness, TM's Clear, oropharynx  pink and moist.   Chest: Clear to auscultation bilaterally.  CVS: S1, S2, No murmurs, No S3.   Abd: Soft, Nontender.  MS: Adequate ROM , hips, shoulders and knees.Decrerased rOM cervical  spine with left neck spasm and tenderness  Ext: No edema.   CNS: CN 2-12 intact, power tone and sensation normal throughout.   Skin: Intact, no visible lesions or rashes.  Psych: Good eye contact,flat affect.  Memory intact,  depressed appearing.    Impression & Recommendations:  Problem # 1:  NECK PAIN (ICD-723.1) Assessment Deteriorated  Her updated medication list for this problem includes:    Baclofen 20 Mg Tabs (Baclofen) .Marland Kitchen... 1 by mouth three times a day    Meloxicam 15 Mg Tabs (Meloxicam) ..... One tab by mouth once daily  Orders: Ketorolac-Toradol 15mg  (Z6109) Admin of Therapeutic Inj  intramuscular or subcutaneous (60454)  Problem # 2:  DEPRESSION (ICD-311) Assessment: Deteriorated  Her updated medication list for this problem includes:    Amitriptyline Hcl 25 Mg Tabs (Amitriptyline hcl) .Marland Kitchen... 1 by mouth two times a day    Venlafaxine Hcl 75 Mg Xr24h-cap (Venlafaxine hcl) .Marland Kitchen... Take 1 capsule by mouth once a day  Orders: Psychology Referral (Psychology)  Discussed treatment options, including trial of antidpressant medication. Will refer to behavioral health. Follow-up call in in 24-48 hours and recheck in 2 weeks, sooner as needed. Patient agrees to call if any worsening of symptoms or thoughts of doing harm arise. Verified that the patient has no suicidal ideation at this time.   Problem # 3:  DIABETES MELLITUS (ICD-250.00) Assessment: Deteriorated  The following medications were removed from the medication list:    Metformin Hcl 500 Mg Tabs (Metformin hcl) ..... One tab by mouth bid Her updated medication list for this problem includes:    Metformin Hcl 1000 Mg Tabs (Metformin hcl) .Marland Kitchen... Take 1 tablet by mouth two times a day  Orders: T- Hemoglobin A1C ([id number]) T-Urine Microalbumin w/creat. ratio [phone]) Glucose, (CBG) [phone])  Labs Reviewed: Creat: 0.92 (10/27/2008)    Reviewed HgBA1c results: 7.0 (11/09/2008)  6.1 (10/16/2004)  Problem  # 4:  BACK PAIN (ICD-724.5) Assessment: Unchanged  Her updated medication list for this problem includes:    Baclofen 20 Mg Tabs (Baclofen) .Marland Kitchen... 1 by mouth three times a day    Meloxicam 15 Mg Tabs (Meloxicam) ..... One tab by mouth once daily  Complete Medication List: 1)  Gabapentin 800 Mg Tabs (Gabapentin) .Marland Kitchen.. 1 by mouth three times a day 2)  Baclofen 20 Mg Tabs (Baclofen) .Marland Kitchen.. 1 by mouth three times a day 3)  Amitriptyline Hcl 25 Mg Tabs (Amitriptyline hcl) .Marland Kitchen.. 1 by mouth two times a day 4)  Meloxicam 15 Mg Tabs (Meloxicam) .... One tab by mouth once daily 5)  Venlafaxine Hcl 75 Mg Xr24h-cap (Venlafaxine hcl) .... Take 1 capsule by mouth once a day 6)  Metformin Hcl 1000 Mg Tabs (Metformin hcl) .... Take 1 tablet by mouth two times a day  Other Orders: T-Basic Metabolic Panel [phone]) T-Lipid Profile [phone]) T-TSH [phone]) T-CBC w/Diff [phone]) Radiology Referral (Radiology)  Patient Instructions: 1)  Please schedule a CPE appointment in 6 to 8 weeks 2)  It is important that you exercise regularly at  least 20 minutes 5 times a week. If you develop chest pain, have severe difficulty breathing, or feel very tired , stop exercising immediately and seek medical attention. 3)  You need to lose weight. Consider a lower calorie diet and regular exercise.  4)  Check your blood sugars regularly. If your readings are usually above 250 or below 70 you should contact our office. 5)  BMP prior to visit, ICD-9: 6)  Hepatic Panel prior to visit, ICD-9: 7)  Lipid Panel prior to visit, ICD-9: 8)  CBC w/ Diff prior to visit, ICD-9: 9)  HbgA1C prior to visit, ICD-9: 10)  Urine Microalbumin prior to visit, ICD-9: 11)  You will get injection today fpr left neck pain. 12)  You will be referred to a therapist and also for a mamogram. 13)  New med for hot flashes and depression Prescriptions: METFORMIN HCL 1000 MG TABS (METFORMIN HCL) Take 1 tablet by mouth two times a day   #60 x 3   Entered and Authorized by:   [name] MD   Signed by:   [name] MD on 01/03/2010   Method used:   Electronically to        [facility] [phone]* (retail)       [address]       [place], [place]  [address]       Ph: [phone] or [phone]       Fax: [phone]   RxID:   [id number] MELOXICAM 15 MG  TABS (MELOXICAM) one tab by mouth once daily  #90 Tablet x 1   Entered by:   Everitt Amber LPN   Authorized by:   [name] MD   Signed by:   Everitt Amber LPN on 73/71/0626   Method used:   Electronically to        [facility] [phone]* (retail)       [address]       [place], [place]  [address]       Ph: [phone] or [phone]       Fax: [phone]   RxID:   [id number] AMITRIPTYLINE HCL 25 MG  TABS (AMITRIPTYLINE HCL) 1 by mouth two times a day  #180 x 1   Entered by:   Everitt Amber LPN   Authorized by:   [name] MD   Signed by:   Everitt Amber LPN on 44/31/5400   Method used:   Electronically to        [facility] [phone]* (retail)       [address]       [place], [place]  [address]       Ph: [phone] or [phone]       Fax: [phone]   RxID:   [id number] VENLAFAXINE HCL 75 MG XR24H-CAP (VENLAFAXINE HCL) Take 1 capsule by mouth once a day  #30 x 3   Entered and Authorized by:   [name] MD   Signed by:   [name] MD on 01/02/2010   Method used:   Electronically to        [facility] [phone]* (retail)       [address]       [place], [place]  [address]       Ph: [phone] or [phone]  Fax: [phone]   RxID:   [id number]   Laboratory Results   Blood Tests     Glucose (random): 132 mg/dL   (Normal Range: 40-102)       Medication Administration  Injection # 1:    Medication: Ketorolac-Toradol 15mg     Diagnosis: NECK PAIN (ICD-723.1)    Route: IM    Site: LUOQ gluteus    Exp  Date: 05/2011    Lot #: [id number]     Mfr: hospira    Comments: 60mg  given     Patient tolerated injection without complications    Given by: Everitt Amber LPN (January 02, 2010 3:01 PM)  Orders Added: 1)  Est. Patient Level IV [99214] 2)  T-Basic Metabolic Panel [80048-22910] 3)  T-Lipid Profile [80061-22930] 4)  T-TSH [40347-42595] 5)  T-CBC w/Diff [63875-64332] 6)  T- Hemoglobin A1C [83036-23375] 7)  T-Urine Microalbumin w/creat. ratio [82043-[address]] 8)  Glucose, (CBG) [82962] 9)  Radiology Referral [Radiology] 10)  Ketorolac-Toradol 15mg  [J1885] 11)  Psychology Referral [Psychology] 12)  Admin of Therapeutic Inj  intramuscular or subcutaneous [[address]]

## 2010-07-25 NOTE — Progress Notes (Signed)
Summary: strips  Phone Note Call from Patient   Summary of Call: her blood sugar machine is accu ck. left message so u would know what kind of strips Initial call taken by: Lind Guest,  January 03, 2010 1:15 PM  Follow-up for Phone Call        Phone Call Completed, Rx Called In Follow-up by: Adella Hare LPN,  January 03, 2010 3:15 PM    New/Updated Medications: ACCU-CHEK AVIVA  STRP (GLUCOSE BLOOD) once daily testing Prescriptions: ACCU-CHEK AVIVA  STRP (GLUCOSE BLOOD) once daily testing  #100 x 2   Entered by:   Adella Hare LPN   Authorized by:   [name] MD   Signed by:   Adella Hare LPN on 16/03/9603   Method used:   Electronically to        [facility] [phone]* (retail)       [address]       [place], [place]  [address]       Ph: [phone] or [phone]       Fax: [phone]   RxID:   [id number]

## 2010-07-25 NOTE — Assessment & Plan Note (Signed)
Summary: physical   Vital Signs:  Patient profile:   50 year old female Menstrual status:  hysterectomy Height:      66 inches Weight:      212.75 pounds BMI:     34.46 O2 Sat:      98 % Pulse rate:   99 / minute Pulse rhythm:   regular Resp:     15 per minute BP sitting:   114 / 82  (left arm) Cuff size:   large  Vitals Entered By: Everitt Amber LPN (March 06, 2010 10:30 AM)  Nutrition Counseling: Patient's BMI is greater than 25 and therefore counseled on weight management options. CC: Follow up chronic problems, wants to reschedule pap for next visit   CC:  Follow up chronic problems and wants to reschedule pap for next visit.  History of Present Illness: Reports  that she has been doing much better, She is no longer depressed and never took the med. States she had been depressed about herself  and her weight, she has started therapeutic lifestyle change with excellent results  Denies recent fever or chills. Denies sinus pressure, nasal congestion , ear pain or sore throat. Denies chest congestion, or cough productive of sputum. Denies chest pain, palpitations, PND, orthopnea or leg swelling. Denies abdominal pain, nausea, vomitting, diarrhea or constipation. Denies change in bowel movements or bloody stool. Denies dysuria , frequency, incontinence or hesitancy. Denies  joint pain, swelling, or reduced mobility. Denies headaches, vertigo, seizures. Denies depression, anxiety or insomnia. Denies  rash, lesions, or itch.     Current Medications (verified): 1)  Gabapentin 800 Mg  Tabs (Gabapentin) .Marland Kitchen.. 1 By Mouth Three Times A Day 2)  Baclofen 20 Mg  Tabs (Baclofen) .Marland Kitchen.. 1 By Mouth Three Times A Day 3)  Amitriptyline Hcl 25 Mg  Tabs (Amitriptyline Hcl) .Marland Kitchen.. 1 By Mouth Two Times A Day 4)  Meloxicam 15 Mg  Tabs (Meloxicam) .... One Tab By Mouth Once Daily 5)  Venlafaxine Hcl 75 Mg Xr24h-Cap (Venlafaxine Hcl) .... Take 1 Capsule By Mouth Once A Day 6)  Metformin Hcl  1000 Mg Tabs (Metformin Hcl) .... Take 1 Tablet By Mouth Two Times A Day 7)  Accu-Chek Aviva  Strp (Glucose Blood) .... Once Daily Testing  Allergies (verified): 1)  ! Sulfa  Review of Systems      See HPI Eyes:  Complains of vision loss-both eyes; denies blurring and discharge. Endo:  Denies excessive thirst and excessive urination; not testing for te past  6 weeks, no strips, will call insurance and get back to [place]. Heme:  Denies abnormal bruising and bleeding. Allergy:  Denies hives or rash and itching eyes.  Physical Exam  General:  Well-developed,obese,in no acute distress; alert,appropriate and cooperative throughout examination HEENT: No facial asymmetry,  EOMI, No sinus tenderness, TM's Clear, oropharynx  pink and moist.   Chest: Clear to auscultation bilaterally.  CVS: S1, S2, No murmurs, No S3.   Abd: Soft, Nontender.  MS: Adequate ROM spine, hips, shoulders and knees.  Ext: No edema.   CNS: CN 2-12 intact, power tone and sensation normal throughout.   Skin: Intact, no visible lesions or rashes.  Psych: Good eye contact, normal affect.  Memory intact, not anxious or depressed appearing.    Impression & Recommendations:  Problem # 1:  DEPRESSION (ICD-311) Assessment Improved  The following medications were removed from the medication list:    Venlafaxine Hcl 75 Mg Xr24h-cap (Venlafaxine hcl) .Marland Kitchen... Take 1 capsule by mouth once  a day Her updated medication list for this problem includes:    Amitriptyline Hcl 25 Mg Tabs (Amitriptyline hcl) .Marland Kitchen... 1 by mouth two times a day  Problem # 2:  OBESITY (ICD-278.00) Assessment: Improved  Ht: 66 (03/06/2010)   Wt: 212.75 (03/06/2010)   BMI: 34.46 (03/06/2010) therapeutic lifestyle change discussed and encouraged  Problem # 3:  DIABETES MELLITUS (ICD-250.00) Assessment: Comment Only  Her updated medication list for this problem includes:    Metformin Hcl 1000 Mg Tabs (Metformin hcl) .Marland Kitchen... Take 1 tablet by mouth two times a  day  Orders: Glucose, (CBG) (16109) T-Urine Microalbumin w/creat. ratio [phone]) T- Hemoglobin A1C ([phone])  Labs Reviewed: Creat: 1.02 (01/02/2010)    Reviewed HgBA1c results: 7.3 (01/02/2010)  7.0 (11/09/2008) Patient advised to reduce carbs and sweets, commit to regular physical activity, take meds as prescribed, test blood sugars as directed, and attempt to lose weight , to improve blood sugar control.  Complete Medication List: 1)  Gabapentin 800 Mg Tabs (Gabapentin) .Marland Kitchen.. 1 by mouth three times a day 2)  Baclofen 20 Mg Tabs (Baclofen) .Marland Kitchen.. 1 by mouth three times a day 3)  Amitriptyline Hcl 25 Mg Tabs (Amitriptyline hcl) .Marland Kitchen.. 1 by mouth two times a day 4)  Meloxicam 15 Mg Tabs (Meloxicam) .... One tab by mouth once daily 5)  Metformin Hcl 1000 Mg Tabs (Metformin hcl) .... Take 1 tablet by mouth two times a day  Other Orders: T-Basic Metabolic Panel [phone]) Influenza Vaccine NON MCR [phone]) Pneumococcal Vaccine (32440) Admin 1st Vaccine (10272) Admin 1st Vaccine [facility]) [phone])  Patient Instructions: 1)  cPE end October. 2)  It is important that you exercise regularly at least 20 minutes 5 times a week. If you develop chest pain, have severe difficulty breathing, or feel very tired , stop exercising immediately and seek medical attention. 3)  You need to lose weight. Consider a lower calorie diet and regular exercise. Congrats on your 7 pound weght loss, keep it up. 4)  HbgA1C prior to visit, ICD-9: and chem 7  approx 3 days before next oV 5)  immuniztion today as discussed, microalb today  Laboratory Results   Blood Tests   Date/Time Received: March 06, 2010  Date/Time Reported: March 06, 2010   Glucose (random): 118 mg/dL   (Normal Range: 03-474)        Pneumovax    Vaccine Type: Pneumovax    Site: right deltoid    Mfr: Merck    Dose: 0.5 ml    Route: IM    Given by: Everitt Amber LPN    Exp. Date: 09/07/2011    Lot #:  [id number]  Influenza Vaccine    Vaccine Type: Fluvax Non-MCR    Site: left deltoid    Mfr: novartis     Dose: 0.5 ml    Route: IM    Given by: Everitt Amber LPN    Exp. Date: 10/2010    Lot #: [id number]

## 2010-08-18 ENCOUNTER — Ambulatory Visit (INDEPENDENT_AMBULATORY_CARE_PROVIDER_SITE_OTHER): Payer: Medicare Other | Admitting: Family Medicine

## 2010-08-18 ENCOUNTER — Other Ambulatory Visit: Payer: Self-pay | Admitting: Family Medicine

## 2010-08-18 ENCOUNTER — Encounter: Payer: Self-pay | Admitting: Family Medicine

## 2010-08-18 DIAGNOSIS — E669 Obesity, unspecified: Secondary | ICD-10-CM

## 2010-08-18 DIAGNOSIS — E119 Type 2 diabetes mellitus without complications: Secondary | ICD-10-CM

## 2010-08-18 DIAGNOSIS — G4700 Insomnia, unspecified: Secondary | ICD-10-CM

## 2010-08-18 DIAGNOSIS — Z139 Encounter for screening, unspecified: Secondary | ICD-10-CM

## 2010-08-22 ENCOUNTER — Ambulatory Visit (HOSPITAL_COMMUNITY): Payer: Commercial Managed Care - PPO

## 2010-08-22 ENCOUNTER — Encounter: Payer: Self-pay | Admitting: Family Medicine

## 2010-08-22 LAB — CONVERTED CEMR LAB
Calcium: 9.1 mg/dL (ref 8.4–10.5)
Creatinine, Ser: 0.85 mg/dL (ref 0.40–1.20)
Hgb A1c MFr Bld: 7.9 % — ABNORMAL HIGH (ref <5.7)

## 2010-08-22 NOTE — Assessment & Plan Note (Signed)
Summary: F UP   Vital Signs:  Patient profile:   50 year old female Menstrual status:  hysterectomy Height:      66 inches Weight:      197 pounds BMI:     31.91 O2 Sat:      95 % on Room air Pulse rate:   106 / minute Pulse rhythm:   regular Resp:     16 per minute BP sitting:   130 / 80  (left arm)  Vitals Entered By: Adella Hare LPN (August 18, 2010 11:26 AM)  Nutrition Counseling: Patient's BMI is greater than 25 and therefore counseled on weight management options.  O2 Flow:  Room air CC: follow-up visit Is Patient Diabetic? Yes   CC:  follow-up visit.  History of Present Illness: Reports  that she has been doing well, except for poor sleep, wants her imipramine dose increased, thought incorrectly that the dose had been reduced. Denies recent fever or chills. Denies sinus pressure, nasal congestion , ear pain or sore throat. Denies chest congestion, or cough productive of sputum. Denies chest pain, palpitations, PND, orthopnea or leg swelling. Denies abdominal pain, nausea, vomitting, diarrhea or constipation. Denies change in bowel movements or bloody stool. Denies dysuria , frequency, incontinence or hesitancy. Denies  joint pain, swelling, or reduced mobility. Denies headaches, vertigo, seizures. Denies depression, anxiety or insomnia. Denies  rash, lesions, or itch.     Current Medications (verified): 1)  Gabapentin 800 Mg  Tabs (Gabapentin) .Marland Kitchen.. 1 By Mouth Two Times A Day 2)  Baclofen 20 Mg  Tabs (Baclofen) .Marland Kitchen.. 1 By Mouth Two Times A Day 3)  Meloxicam 15 Mg  Tabs (Meloxicam) .... One Tab By Mouth Twice Daily 4)  Metformin Hcl 1000 Mg Tabs (Metformin Hcl) .... Take 1 Tablet By Mouth Two Times A Day 5)  Amitriptyline Hcl 50 Mg Tabs (Amitriptyline Hcl) .... Take 1 Tab By Mouth At Bedtime  Allergies (verified): 1)  ! Sulfa  Review of Systems      See HPI General:  Complains of sleep disorder; c/o poor sleep. Eyes:  Complains of vision loss-both  eyes. Endo:  Complains of weight change; denies excessive thirst and excessive urination; not testing regularly. Heme:  Denies abnormal bruising and bleeding. Allergy:  Denies hives or rash and itching eyes.  Physical Exam  General:  Well-developed,obese,in no acute distress; alert,appropriate and cooperative throughout examination HEENT: No facial asymmetry,  EOMI, No sinus tenderness, TM's Clear, oropharynx  pink and moist.   Chest: Clear to auscultation bilaterally.  CVS: S1, S2, No murmurs, No S3.   Abd: Soft, Nontender.  MS: Adequate ROM spine, hips, shoulders and knees.  Ext: No edema.   CNS: CN 2-12 intact, power tone and sensation normal throughout.   Skin: Intact, no visible lesions or rashes.  Psych: Good eye contact, normal affect.  Memory intact, not anxious or depressed appearing.    Impression & Recommendations:  Problem # 1:  INSOMNIA (ICD-780.52) Assessment Deteriorated  Her updated medication list for this problem includes:    Temazepam 30 Mg Caps (Temazepam) .Marland Kitchen... Take 1 capsule by mouth at bedtime for sleep, discontinue amitriptyline effective 08/18/2010  Orders: Medicare Electronic Prescription [phone])  Discussed sleep hygiene.   Problem # 2:  DIABETES MELLITUS (ICD-250.00) Assessment: Deteriorated  The following medications were removed from the medication list:    Metformin Hcl 1000 Mg Tabs (Metformin hcl) .Marland Kitchen... Take 1 tablet by mouth two times a day Her updated medication list for this problem  includes:    Kombiglyze Xr 2.10-998 Mg Xr24h-tab (Saxagliptin-metformin) .Marland Kitchen..Marland Kitchen Two tablets once daily effective 08/21/2010, discontinue metformin  Orders: T- Hemoglobin A1C [phone]) T- Hemoglobin A1C (13086-57846)  Labs Reviewed: Creat: 0.89 (04/13/2010)    Reviewed HgBA1c results: 5.9 (04/13/2010)  7.3 (01/02/2010)  Problem # 3:  OBESITY (ICD-278.00) Assessment: Improved  Ht: 66 (08/18/2010)   Wt: 197 (08/18/2010)   BMI: 31.91  (08/18/2010) therapeutic lifestyle change discussed and encouraged Unfortunately some of thios weight loss is from uncontrolled blood sugars  Complete Medication List: 1)  Gabapentin 800 Mg Tabs (Gabapentin) .Marland Kitchen.. 1 by mouth two times a day 2)  Baclofen 20 Mg Tabs (Baclofen) .Marland Kitchen.. 1 by mouth two times a day 3)  Meloxicam 15 Mg Tabs (Meloxicam) .... One tab by mouth twice daily 4)  Temazepam 30 Mg Caps (Temazepam) .... Take 1 capsule by mouth at bedtime for sleep, discontinue amitriptyline effective 08/18/2010 5)  Kombiglyze Xr 2.10-998 Mg Xr24h-tab (Saxagliptin-metformin) .... Two tablets once daily effective 08/21/2010, discontinue metformin  Other Orders: T-Basic Metabolic Panel [phone]) T-Basic Metabolic Panel [phone]) Future Orders: Radiology Referral (Radiology) ... 08/22/2010  Patient Instructions: 1)  Please schedule a follow-up appointment in 3.5 months. 2)  It is important that you exercise regularly at least 30 minutes 5 times a week. If you develop chest pain, have severe difficulty breathing, or feel very tired , stop exercising immediately and seek medical attention. 3)  You need to lose weight. Consider a lower calorie diet and regular exercise. cONGRATS on weight loss pls keep it up. 4)  BMP prior to visit, ICD-9: 5)  HbgA1C prior to visit, ICD-9:   today 6)  chem 7 7)  HbgA1C prior to visit, ICD-9:   non fasting in 3.5 month 8)  new med for sleep, temazepam, stop amitriptiline once you get it please Prescriptions: KOMBIGLYZE XR 2.10-998 MG XR24H-TAB (SAXAGLIPTIN-METFORMIN) two tablets once daily effective 08/21/2010, discontinue metformin  #60 x 4   Entered and Authorized by:   [name] MD   Signed by:   [name] MD on 08/19/2010   Method used:   Historical   RxID:   [id number] TEMAZEPAM 30 MG CAPS (TEMAZEPAM) Take 1 capsule by mouth at bedtime for sleep, discontinue amitriptyline effective 08/18/2010  #30 x 3   Entered and Authorized by:    [name] MD   Signed by:   [name] MD on 08/18/2010   Method used:   Printed then faxed to ...       [address] [phone]* (retail)       [address]       [place], [place]  [address]       Ph: [phone] or [phone]       Fax: [phone]   RxID:   [id number]    Orders Added: 1)  Est. Patient Level IV [61607] 2)  Medicare Electronic Prescription [G8553] 3)  T-Basic Metabolic Panel [80048-22910] 4)  T- Hemoglobin A1C [83036-23375] 5)  T-Basic Metabolic Panel [80048-22910] 6)  T- Hemoglobin A1C [83036-23375] 7)  Radiology Referral [Radiology]

## 2010-08-24 ENCOUNTER — Ambulatory Visit (HOSPITAL_COMMUNITY)
Admission: RE | Admit: 2010-08-24 | Discharge: 2010-08-24 | Disposition: A | Discharge status: Home / Self Care | Payer: Commercial Managed Care - PPO | Source: Ambulatory Visit | Attending: Family Medicine | Admitting: Family Medicine

## 2010-08-24 DIAGNOSIS — Z1231 Encounter for screening mammogram for malignant neoplasm of breast: Secondary | ICD-10-CM | POA: Insufficient documentation

## 2010-08-24 DIAGNOSIS — Z139 Encounter for screening, unspecified: Secondary | ICD-10-CM

## 2010-08-25 ENCOUNTER — Other Ambulatory Visit: Payer: Self-pay | Admitting: Family Medicine

## 2010-08-25 DIAGNOSIS — R928 Other abnormal and inconclusive findings on diagnostic imaging of breast: Secondary | ICD-10-CM

## 2010-08-31 ENCOUNTER — Encounter: Payer: Self-pay | Admitting: Family Medicine

## 2010-08-31 NOTE — Letter (Signed)
Summary: d/c medicine  d/c medicine   Imported By: Lind Guest 08/22/2010 13:56:23  _____________________________________________________________________  External Attachment:    Type:   Image     Comment:   External Document

## 2010-09-05 ENCOUNTER — Encounter: Payer: Self-pay | Admitting: Family Medicine

## 2010-09-05 ENCOUNTER — Ambulatory Visit (INDEPENDENT_AMBULATORY_CARE_PROVIDER_SITE_OTHER): Payer: Commercial Managed Care - PPO | Admitting: Family Medicine

## 2010-09-05 DIAGNOSIS — N3090 Cystitis, unspecified without hematuria: Secondary | ICD-10-CM | POA: Insufficient documentation

## 2010-09-05 DIAGNOSIS — E669 Obesity, unspecified: Secondary | ICD-10-CM

## 2010-09-05 DIAGNOSIS — N760 Acute vaginitis: Secondary | ICD-10-CM | POA: Insufficient documentation

## 2010-09-05 DIAGNOSIS — E119 Type 2 diabetes mellitus without complications: Secondary | ICD-10-CM

## 2010-09-05 LAB — CONVERTED CEMR LAB
Blood in Urine, dipstick: NEGATIVE
Nitrite: NEGATIVE
Protein, U semiquant: NEGATIVE
Urobilinogen, UA: 0.2
WBC Urine, dipstick: NEGATIVE

## 2010-09-06 ENCOUNTER — Ambulatory Visit (HOSPITAL_COMMUNITY)
Admission: RE | Admit: 2010-09-06 | Discharge: 2010-09-06 | Disposition: A | Discharge status: Home / Self Care | Payer: Commercial Managed Care - PPO | Source: Ambulatory Visit | Attending: Family Medicine | Admitting: Family Medicine

## 2010-09-06 ENCOUNTER — Other Ambulatory Visit: Payer: Self-pay | Admitting: Family Medicine

## 2010-09-06 ENCOUNTER — Encounter: Payer: Self-pay | Admitting: Family Medicine

## 2010-09-06 DIAGNOSIS — R928 Other abnormal and inconclusive findings on diagnostic imaging of breast: Secondary | ICD-10-CM

## 2010-09-06 LAB — CONVERTED CEMR LAB
Candida species: NEGATIVE
Gardnerella vaginalis: NEGATIVE

## 2010-09-08 ENCOUNTER — Telehealth: Payer: Self-pay | Admitting: Family Medicine

## 2010-09-21 NOTE — Assessment & Plan Note (Signed)
Summary: office visit   Vital Signs:  Patient profile:   50 year old female Menstrual status:  hysterectomy Height:      66 inches Weight:      197.25 pounds O2 Sat:      96 % Pulse rate:   96 / minute Pulse rhythm:   regular Resp:     16 per minute BP sitting:   132 / 88  (left arm) Cuff size:   large  Vitals Entered By: Everitt Amber LPN (September 05, 2010 2:32 PM) CC: c/o chills, and shivering, then she gets hot, worse at night. Also having cottage cheese discharge, no buring or itching. Sometimes its yellow and sometimes green   CC:  c/o chills, and shivering, then she gets hot, worse at night. Also having cottage cheese discharge, and no buring or itching. Sometimes its yellow and sometimes green.  History of Present Illness: Reports  that she had been well up until 3 days ago  Denies sinus pressure, nasal congestion , ear pain or sore throat. Denies chest congestion, or cough productive of sputum. Denies chest pain, palpitations, PND, orthopnea or leg swelling. Denies abdominal pain, nausea, vomitting, diarrhea or constipation. Denies change in bowel movements or bloody stool. Denies dysuria , frequency, incontinence or hesitancy. Denies  joint pain, swelling, or reduced mobility. Denies headaches, vertigo, seizures. Denies depression, anxiety or insomnia. Denies  rash, lesions, or itch.     Current Medications (verified): 1)  Gabapentin 800 Mg  Tabs (Gabapentin) .Marland Kitchen.. 1 By Mouth Two Times A Day 2)  Baclofen 20 Mg  Tabs (Baclofen) .Marland Kitchen.. 1 By Mouth Two Times A Day 3)  Meloxicam 15 Mg  Tabs (Meloxicam) .... One Tab By Mouth Twice Daily 4)  Temazepam 30 Mg Caps (Temazepam) .... Take 1 Capsule By Mouth At Bedtime For Sleep, Discontinue Amitriptyline Effective 08/18/2010 5)  Kombiglyze Xr 2.10-998 Mg Xr24h-Tab (Saxagliptin-Metformin) .... Two Tablets Once Daily Effective 08/21/2010, Discontinue Metformin  Allergies (verified): 1)  ! Sulfa  Review of Systems      See  HPI General:  Complains of chills. Eyes:  Complains of vision loss-both eyes. GU:  Complains of discharge and urinary frequency; increased vaginal discharge x 4 days, no dyspareunia, no odor, cottage cheese, sonmetimes green sometimes yellow. Endo:  Denies excessive thirst and excessive urination. Heme:  Denies abnormal bruising and bleeding. Allergy:  Complains of seasonal allergies; denies hives or rash and itching eyes.  Physical Exam  General:  Well-developed,well-nourished,in no acute distress; alert,appropriate and cooperative throughout examination HEENT: No facial asymmetry,  EOMI, No sinus tenderness, TM's Clear, oropharynx  pink and moist.   Chest: Clear to auscultation bilaterally.  CVS: S1, S2, No murmurs, No S3.   Abd: Soft, Nontender.  MS: Adequate ROM spine, hips, shoulders and knees.  Ext: No edema.   CNS: CN 2-12 intact, power tone and sensation normal throughout.   Skin: Intact, no visible lesions or rashes.  Psych: Good eye contact, normal affect.  Memory intact, not anxious or depressed appearing. gU: thick cottage cheese yellow d/c   Impression & Recommendations:  Problem # 1:  UNSPECIFIED VAGINITIS AND VULVOVAGINITIS (ICD-616.10) Assessment Comment Only  Her updated medication list for this problem includes:    Metronidazole 500 Mg Tabs (Metronidazole) .Marland Kitchen... Take 1 tablet by mouth two times a day  Orders: T-Wet Prep by Molecular Probe [id number]) Chlamydia and GC Probe Amp, genital ([id number])  Problem # 2:  OBESITY (ICD-278.00) Assessment: Unchanged  Ht: 66 (09/05/2010)   Wt: 197.25 (  09/05/2010)   BMI: 31.91 (08/18/2010)  Problem # 3:  DIABETES MELLITUS (ICD-250.00) Assessment: Deteriorated  Her updated medication list for this problem includes:    Kombiglyze Xr 2.10-998 Mg Xr24h-tab (Saxagliptin-metformin) .Marland Kitchen..Marland Kitchen Two tablets once daily effective 08/21/2010, discontinue metformin  Labs Reviewed: Creat: 0.85 (08/18/2010)    Reviewed HgBA1c  results: 7.9 (08/18/2010)  5.9 (04/13/2010)  Complete Medication List: 1)  Gabapentin 800 Mg Tabs (Gabapentin) .Marland Kitchen.. 1 by mouth two times a day 2)  Baclofen 20 Mg Tabs (Baclofen) .Marland Kitchen.. 1 by mouth two times a day 3)  Meloxicam 15 Mg Tabs (Meloxicam) .... One tab by mouth twice daily 4)  Temazepam 30 Mg Caps (Temazepam) .... Take 1 capsule by mouth at bedtime for sleep, discontinue amitriptyline effective 08/18/2010 5)  Kombiglyze Xr 2.10-998 Mg Xr24h-tab (Saxagliptin-metformin) .... Two tablets once daily effective 08/21/2010, discontinue metformin 6)  Metronidazole 500 Mg Tabs (Metronidazole) .... Take 1 tablet by mouth two times a day 7)  Fluconazole 150 Mg Tabs (Fluconazole) .... Take 1 tablet by mouth once a day  Other Orders: Glucose, (CBG) (04540)  Patient Instructions: 1)  F/U as before. 2)  PLS TEST your sugars once daily , call if out of range we ill give you the numbers.Marland Kitchen 3)  MORNING 80 to 120, before breakfast 4)  BEDTIME  or 2 hours after a meal 130 to 170 5)  we will call with test results Prescriptions: FLUCONAZOLE 150 MG TABS (FLUCONAZOLE) Take 1 tablet by mouth once a day  #3 x 0   Entered and Authorized by:   [name] MD   Signed by:   [name] MD on 09/10/2010   Method used:   Historical   RxID:   [id number] METRONIDAZOLE 500 MG TABS (METRONIDAZOLE) Take 1 tablet by mouth two times a day  #14 x 0   Entered and Authorized by:   [name] MD   Signed by:   [name] MD on 09/10/2010   Method used:   Historical   RxID:   [id number]    Orders Added: 1)  Glucose, (CBG) [82962] 2)  Est. Patient Level IV [13244] 3)  T-Wet Prep by Molecular Probe [[id number]] 4)  Chlamydia and GC Probe Amp, genital [[id number]]    Laboratory Results   Urine Tests  Date/Time Received: September 05, 2010  Date/Time Reported: September 05, 2010   Routine Urinalysis   Color: yellow Appearance: Clear Glucose: negative   (Normal Range:  Negative) Bilirubin: negative   (Normal Range: Negative) Ketone: negative   (Normal Range: Negative) Spec. Gravity: 1.010   (Normal Range: 1.003-1.035) Blood: negative   (Normal Range: Negative) pH: 6.0   (Normal Range: 5.0-8.0) Protein: negative   (Normal Range: Negative) Urobilinogen: 0.2   (Normal Range: 0-1) Nitrite: negative   (Normal Range: Negative) Leukocyte Esterace: negative   (Normal Range: Negative)

## 2010-09-21 NOTE — Progress Notes (Signed)
  Phone Note Call from Patient   Caller: Patient Summary of Call: all tests (cultures) were negative but patient complains of still having discharge Initial call taken by: Adella Hare LPN,  September 08, 2010 9:35 AM  Follow-up for Phone Call        I have entered metronidazole and fluconazole historically pls advise pt this should clear the d/c , and then fax/send in Follow-up by: [name] MD,  September 10, 2010 8:16 PM  Additional Follow-up for Phone Call Additional follow up Details #1::        patient aware Additional Follow-up by: Adella Hare LPN,  September 11, 2010 8:41 AM    Prescriptions: FLUCONAZOLE 150 MG TABS (FLUCONAZOLE) Take 1 tablet by mouth once a day  #3 x 0   Entered by:   Adella Hare LPN   Authorized by:   [name] MD   Signed by:   Adella Hare LPN on 16/03/9603   Method used:   Electronically to        [facility] [phone]* (retail)       [address]       [place], [place]  [address]       Ph: [phone] or [phone]       Fax: [phone]   RxID:   [id number] METRONIDAZOLE 500 MG TABS (METRONIDAZOLE) Take 1 tablet by mouth two times a day  #14 x 0   Entered by:   Adella Hare LPN   Authorized by:   [name] MD   Signed by:   Adella Hare LPN on 74/25/9563   Method used:   Electronically to        [facility] [phone]* (retail)       [address]       [place], [place]  [address]       Ph: [phone] or [phone]       Fax: [phone]   RxID:   [id number]

## 2010-10-27 ENCOUNTER — Other Ambulatory Visit: Payer: Self-pay | Admitting: Family Medicine

## 2010-12-01 ENCOUNTER — Encounter: Payer: Self-pay | Admitting: Family Medicine

## 2010-12-01 ENCOUNTER — Ambulatory Visit (INDEPENDENT_AMBULATORY_CARE_PROVIDER_SITE_OTHER): Payer: 59 | Admitting: Family Medicine

## 2010-12-01 ENCOUNTER — Telehealth: Payer: Self-pay | Admitting: Family Medicine

## 2010-12-01 VITALS — BP 120/82 | HR 62 | Resp 16 | Ht 66.0 in | Wt 200.0 lb

## 2010-12-01 DIAGNOSIS — E119 Type 2 diabetes mellitus without complications: Secondary | ICD-10-CM

## 2010-12-01 DIAGNOSIS — E669 Obesity, unspecified: Secondary | ICD-10-CM

## 2010-12-01 DIAGNOSIS — B029 Zoster without complications: Secondary | ICD-10-CM

## 2010-12-01 DIAGNOSIS — M549 Dorsalgia, unspecified: Secondary | ICD-10-CM

## 2010-12-01 MED ORDER — GLUCOSE BLOOD VI STRP: ORAL_STRIP | Status: DC

## 2010-12-01 MED ORDER — HYDROXYZINE HCL 25 MG PO TABS: 25.0000 mg | ORAL_TABLET | Freq: Three times a day (TID) | ORAL | Status: DC | PRN

## 2010-12-01 MED ORDER — ACYCLOVIR 800 MG PO TABS: ORAL_TABLET | ORAL | Status: DC

## 2010-12-01 MED ORDER — GABAPENTIN 800 MG PO TABS: ORAL_TABLET | ORAL | Status: DC

## 2010-12-01 NOTE — Patient Instructions (Signed)
Cancel June appointment for June 25, and reschedule for mid October. Pls get labs the week of June 20, we will call with results .  You have shingles, which is jkust reacivated chicken pox. Pls practice hand washing, and do not scratch the areas. You are potentially contagious until the rash improves, wjhich I expect to happen in the nex 3 to 5 days. Med is sent in for the virus, gabapentin as before for the pain, and med is sent in for bedtime use  HBA1 in October

## 2010-12-01 NOTE — Assessment & Plan Note (Signed)
New diagnosis will treat aggresively

## 2010-12-01 NOTE — Telephone Encounter (Signed)
Worked in this morning 

## 2010-12-02 NOTE — Assessment & Plan Note (Signed)
Uncontrolled when last checked, pt has labs due, she reports much improved blood sugars on medication, will wait to see new HBA1C

## 2010-12-02 NOTE — Progress Notes (Signed)
  Subjective:    Patient ID: Brittany Nunez, female    DOB: Oct 10, 1960, 50 y.o.   MRN: [id number]  HPI 2 day h/o painful blister like rash starting in left posterior chest, radiaiting to left anterior chest, blister like. No other family members or close contacts affected, had chicken pox in childhood presumabyl. Denies myalgia, fever, chills or any constitutional symptoms States she has been "intolerant" of kombiglyze, causes diarreah, I advised this was becsause she had been eating meals with excessive CHO load, she had changed to taking the med at night , with some "soiling", will adjust diet more. Fasting sugars are reportedly generally under 120   Review of Systems Denies recent fever or chills. Denies sinus pressure, nasal congestion, ear pain or sore throat. Denies chest congestion, productive cough or wheezing. Denies chest pains, palpitations, paroxysmal nocturnal dyspnea, orthopnea and leg swelling Denies abdominal pain, nausea, vomiting, or constipation.  Denies dysuria, frequency, hesitancy or incontinence.  Denies headaches, seizure, numbness, or tingling. Denies depression, anxiety or insomnia.       Objective:   Physical Exam Patient alert and oriented and in no Cardiopulmonary distress.  HEENT: No facial asymmetry, EOMI, no sinus tenderness, Oropharynx pink and moist.  Neck supple no adenopathy.  Chest: Clear to auscultation bilaterally.  CVS: S1, S2 no murmurs, no S3.  ABD: Soft non tender. Bowel sounds normal.  Ext: No edema  MS: Adequate ROM spine, shoulders, hips and knees.  Skin: Intact,erythematous vesicular rash in dermatomal distribution along left flank.  Psych: Good eye contact, normal affect. Memory intact not anxious or depressed appearing.  CNS: CN 2-12 intact, power, tone and sensation normal throughout.        Assessment & Plan:

## 2010-12-02 NOTE — Assessment & Plan Note (Signed)
Controlled, no change in medication  

## 2010-12-02 NOTE — Assessment & Plan Note (Signed)
Unchanged, but markedly improved in the past year, encouraged continued lifestyle change to promote weightloss

## 2010-12-04 ENCOUNTER — Telehealth: Payer: Self-pay | Admitting: Family Medicine

## 2010-12-05 MED ORDER — ACYCLOVIR 800 MG PO TABS: ORAL_TABLET | ORAL | Status: AC

## 2010-12-05 NOTE — Telephone Encounter (Signed)
Patient has a shingles back,was seen for this on Friday, would like to know if anything can be sent in for this?

## 2010-12-05 NOTE — Telephone Encounter (Signed)
Med sent, patient aware 

## 2010-12-05 NOTE — Telephone Encounter (Signed)
pls refill the course of acyclovir x 1 , this is ok, let her know

## 2010-12-11 ENCOUNTER — Other Ambulatory Visit: Payer: Self-pay | Admitting: Family Medicine

## 2010-12-14 ENCOUNTER — Telehealth: Payer: Self-pay | Admitting: Family Medicine

## 2010-12-14 ENCOUNTER — Ambulatory Visit: Payer: Medicare Other | Admitting: Family Medicine

## 2010-12-15 NOTE — Telephone Encounter (Signed)
Spoke with patient, mailed some diabetic info per her request

## 2010-12-19 ENCOUNTER — Other Ambulatory Visit: Payer: Self-pay

## 2010-12-19 MED ORDER — TEMAZEPAM 30 MG PO CAPS: 30.0000 mg | ORAL_CAPSULE | Freq: Every evening | ORAL | Status: DC | PRN

## 2011-01-09 ENCOUNTER — Other Ambulatory Visit: Payer: Self-pay | Admitting: Family Medicine

## 2011-01-11 ENCOUNTER — Telehealth: Payer: Self-pay | Admitting: Family Medicine

## 2011-01-11 NOTE — Telephone Encounter (Signed)
Called patient, left message.

## 2011-01-11 NOTE — Telephone Encounter (Signed)
pls call pt see why she needs this, if she just missed appt , this shouklld not be necessary, if a new problem then I need to know, pls document it

## 2011-01-15 NOTE — Telephone Encounter (Signed)
Patient has already been referred for colonoscopy, she was advised to call and schedule this

## 2011-03-07 ENCOUNTER — Encounter: Payer: Self-pay | Admitting: Family Medicine

## 2011-03-08 ENCOUNTER — Encounter: Payer: Self-pay | Admitting: Family Medicine

## 2011-03-08 ENCOUNTER — Other Ambulatory Visit: Payer: Self-pay | Admitting: Family Medicine

## 2011-03-08 ENCOUNTER — Ambulatory Visit (INDEPENDENT_AMBULATORY_CARE_PROVIDER_SITE_OTHER): Payer: 59 | Admitting: Family Medicine

## 2011-03-08 VITALS — BP 120/70 | HR 76 | Resp 16 | Ht 66.0 in | Wt 207.1 lb

## 2011-03-08 DIAGNOSIS — N3090 Cystitis, unspecified without hematuria: Secondary | ICD-10-CM

## 2011-03-08 DIAGNOSIS — N390 Urinary tract infection, site not specified: Secondary | ICD-10-CM

## 2011-03-08 DIAGNOSIS — Z23 Encounter for immunization: Secondary | ICD-10-CM

## 2011-03-08 DIAGNOSIS — E669 Obesity, unspecified: Secondary | ICD-10-CM

## 2011-03-08 DIAGNOSIS — M549 Dorsalgia, unspecified: Secondary | ICD-10-CM

## 2011-03-08 DIAGNOSIS — E119 Type 2 diabetes mellitus without complications: Secondary | ICD-10-CM

## 2011-03-08 LAB — POCT URINALYSIS DIPSTICK
Bilirubin, UA: NEGATIVE
Glucose, UA: NEGATIVE
Ketones, UA: NEGATIVE
Nitrite, UA: NEGATIVE
Spec Grav, UA: 1.02

## 2011-03-08 MED ORDER — INFLUENZA VAC TYPES A & B PF IM SUSP: 0.5000 mL | Freq: Once | INTRAMUSCULAR | Status: DC

## 2011-03-08 MED ORDER — CIPROFLOXACIN HCL 500 MG PO TABS: 500.0000 mg | ORAL_TABLET | Freq: Two times a day (BID) | ORAL | Status: AC
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
## 2011-03-08 NOTE — Patient Instructions (Signed)
F/u as before.  Medication is sent in for urinary infection.  It is important that you exercise regularly at least 30 minutes 5 times a week. If you develop chest pain, have severe difficulty breathing, or feel very tired, stop exercising immediately and seek medical attention    Flu vaccine today

## 2011-03-11 LAB — URINE CULTURE: Colony Count: >=100000

## 2011-03-11 NOTE — Assessment & Plan Note (Signed)
Symptomatic with abnormal CCUA, antibiotic prescribed

## 2011-03-11 NOTE — Assessment & Plan Note (Signed)
Deteriorated. Patient re-educated about  the importance of commitment to a  minimum of 150 minutes of exercise per week. The importance of healthy food choices with portion control discussed. Encouraged to start a food diary, count calories and to consider  joining a support group. Sample diet sheets offered. Goals set by the patient for the next several months.    

## 2011-03-11 NOTE — Assessment & Plan Note (Signed)
Unchanged and controlled on current regime, no change

## 2011-03-11 NOTE — Assessment & Plan Note (Signed)
Symptomatic with abn CCUA, med prescribed

## 2011-03-21 ENCOUNTER — Telehealth: Payer: Self-pay | Admitting: Family Medicine

## 2011-03-21 MED ORDER — TEMAZEPAM 30 MG PO CAPS: 30.0000 mg | ORAL_CAPSULE | Freq: Every evening | ORAL | Status: DC | PRN

## 2011-03-21 NOTE — Telephone Encounter (Signed)
Faxed to [facility].

## 2011-04-09 ENCOUNTER — Encounter: Payer: Self-pay | Admitting: Family Medicine

## 2011-04-13 ENCOUNTER — Ambulatory Visit: Payer: 59 | Admitting: Family Medicine

## 2011-05-03 ENCOUNTER — Other Ambulatory Visit: Payer: Self-pay | Admitting: Family Medicine

## 2011-06-06 ENCOUNTER — Telehealth: Payer: Self-pay

## 2011-06-06 ENCOUNTER — Encounter: Payer: Self-pay | Admitting: Family Medicine

## 2011-06-06 NOTE — Telephone Encounter (Signed)
Work pt in Advertising account executive, [employer]

## 2011-06-07 ENCOUNTER — Encounter: Payer: Self-pay | Admitting: Family Medicine

## 2011-06-07 ENCOUNTER — Ambulatory Visit (INDEPENDENT_AMBULATORY_CARE_PROVIDER_SITE_OTHER): Payer: 59 | Admitting: Family Medicine

## 2011-06-07 VITALS — BP 130/80 | HR 78 | Resp 16 | Ht 66.0 in | Wt 206.1 lb

## 2011-06-07 DIAGNOSIS — L0292 Furuncle, unspecified: Secondary | ICD-10-CM

## 2011-06-07 DIAGNOSIS — E669 Obesity, unspecified: Secondary | ICD-10-CM

## 2011-06-07 DIAGNOSIS — R5383 Other fatigue: Secondary | ICD-10-CM

## 2011-06-07 DIAGNOSIS — R5381 Other malaise: Secondary | ICD-10-CM

## 2011-06-07 DIAGNOSIS — B029 Zoster without complications: Secondary | ICD-10-CM

## 2011-06-07 DIAGNOSIS — B379 Candidiasis, unspecified: Secondary | ICD-10-CM

## 2011-06-07 DIAGNOSIS — B356 Tinea cruris: Secondary | ICD-10-CM

## 2011-06-07 DIAGNOSIS — L0293 Carbuncle, unspecified: Secondary | ICD-10-CM

## 2011-06-07 DIAGNOSIS — E119 Type 2 diabetes mellitus without complications: Secondary | ICD-10-CM

## 2011-06-07 DIAGNOSIS — M549 Dorsalgia, unspecified: Secondary | ICD-10-CM

## 2011-06-07 HISTORY — DX: Candidiasis, unspecified: B37.9

## 2011-06-07 LAB — CBC
MCH: 26 pg (ref 26.0–34.0)
MCV: 81.2 fL (ref 78.0–100.0)
Platelets: 300 10*3/uL (ref 150–400)
RBC: 4.47 MIL/uL (ref 3.87–5.11)

## 2011-06-07 LAB — LIPID PANEL
Total CHOL/HDL Ratio: 3.7 Ratio
VLDL: 14 mg/dL (ref 0–40)

## 2011-06-07 LAB — TSH: TSH: 2.84 u[IU]/mL (ref 0.350–4.500)

## 2011-06-07 MED ORDER — MELOXICAM 15 MG PO TABS: ORAL_TABLET | ORAL | Status: DC

## 2011-06-07 MED ORDER — NYSTATIN 100000 UNIT/GM EX POWD: CUTANEOUS | Status: DC

## 2011-06-07 MED ORDER — TEMAZEPAM 30 MG PO CAPS: 30.0000 mg | ORAL_CAPSULE | Freq: Every evening | ORAL | Status: DC | PRN

## 2011-06-07 MED ORDER — BACLOFEN 20 MG PO TABS: 20.0000 mg | ORAL_TABLET | Freq: Two times a day (BID) | ORAL | Status: DC

## 2011-06-07 MED ORDER — CEPHALEXIN 500 MG PO CAPS: 500.0000 mg | ORAL_CAPSULE | Freq: Two times a day (BID) | ORAL | Status: AC

## 2011-06-07 MED ORDER — TERBINAFINE HCL 250 MG PO TABS: 250.0000 mg | ORAL_TABLET | Freq: Every day | ORAL | Status: DC

## 2011-06-07 MED ORDER — FLUCONAZOLE 100 MG PO TABS: 100.0000 mg | ORAL_TABLET | Freq: Every day | ORAL | Status: AC

## 2011-06-07 NOTE — Progress Notes (Signed)
  Subjective:    Patient ID: Brittany Nunez, female    DOB: 12-06-1960, 50 y.o.   MRN: [id number]  HPI 3 week h/o malodorous vag d/c  As well as skin rash on the outside as well as inside the .lips. Pt has been using topical prep on area which is raw and irritated , very pruritic.States a lot of the problems started when she shaved, and she c/o sore bump o top of mons . Denies fever or chills , but did have purulent drainage from the area. She has been diligent with diet as well as with medication for her diabetes and reports fasting sugars to be seldom over 110   Review of Systems See HPI Denies recent fever or chills. Denies sinus pressure, nasal congestion, ear pain or sore throat. Denies chest congestion, productive cough or wheezing. Denies chest pains, palpitations and leg swelling Denies abdominal pain, nausea, vomiting,diarrhea or constipation.   Denies dysuria, frequency, hesitancy or incontinence. Chronic back pain with stiffness and reduced mobility and spasm  Denies headaches, seizures, numbness, or tingling. Denies depression, anxiety or insomnia.         Objective:   Physical Exam Patient alert and oriented and in no cardiopulmonary distress.  HEENT: No facial asymmetry, EOMI, no sinus tenderness,  oropharynx pink and moist.  Neck supple no adenopathy.  Chest: Clear to auscultation bilaterally.  CVS: S1, S2 no murmurs, no S3.  ABD: Soft non tender. Bowel sounds normal.  Pelvic:extensive fungal and yeast infection in inguinal area bilaterally, furuncle on mons with broken skin Vaginal exam: white d/c and  Erythema of vaginal canal  Ext: No edema  NW:[id number] though  Adequate ROM spine, shoulders, hips and knees.   Psych: Good eye contact, normal affect. Memory intact not anxious or depressed appearing.  CNS: CN 2-12 intact, power, tone and sensation normal throughout.        Assessment & Plan:

## 2011-06-07 NOTE — Patient Instructions (Signed)
F/u in 3.5 months.  Labs today  HBA1C in 3.5 months before follow up You are being treated for fungal and yeast infection, as well as infected hair follicle. Pls stop shaving

## 2011-06-08 LAB — COMPLETE METABOLIC PANEL WITH GFR
ALT: 12 U/L (ref 0–35)
AST: 13 U/L (ref 0–37)
Calcium: 9.3 mg/dL (ref 8.4–10.5)
Chloride: 107 mEq/L (ref 96–112)
Creat: 0.77 mg/dL (ref 0.50–1.10)
Potassium: 4.6 mEq/L (ref 3.5–5.3)
Sodium: 142 mEq/L (ref 135–145)
Total Protein: 6.9 g/dL (ref 6.0–8.3)

## 2011-06-08 LAB — WET PREP BY MOLECULAR PROBE
Gardnerella vaginalis: NEGATIVE
Trichomonas vaginosis: NEGATIVE

## 2011-06-08 LAB — GC/CHLAMYDIA PROBE AMP, GENITAL
Chlamydia, DNA Probe: NEGATIVE
GC Probe Amp, Genital: NEGATIVE

## 2011-06-08 LAB — HEMOGLOBIN A1C: Hgb A1c MFr Bld: 6 % — ABNORMAL HIGH (ref <5.7)

## 2011-06-10 NOTE — Assessment & Plan Note (Signed)
Improved. Pt applauded on succesful weight loss through lifestyle change, and encouraged to continue same. Weight loss goal set for the next several months.  

## 2011-06-10 NOTE — Assessment & Plan Note (Signed)
Controlled, no change in medication  

## 2011-06-10 NOTE — Assessment & Plan Note (Signed)
Unchanged, controlled by med

## 2011-06-10 NOTE — Assessment & Plan Note (Signed)
Severe infection oral agent prescribed, alos superinfection with bacteria and yeast all treated appropriately

## 2011-06-11 ENCOUNTER — Ambulatory Visit: Payer: 59 | Admitting: Family Medicine

## 2011-08-20 ENCOUNTER — Ambulatory Visit (INDEPENDENT_AMBULATORY_CARE_PROVIDER_SITE_OTHER): Payer: Commercial Managed Care - PPO | Admitting: Family Medicine

## 2011-08-20 ENCOUNTER — Encounter: Payer: Self-pay | Admitting: Family Medicine

## 2011-08-20 DIAGNOSIS — R3129 Other microscopic hematuria: Secondary | ICD-10-CM

## 2011-08-20 DIAGNOSIS — R300 Dysuria: Secondary | ICD-10-CM

## 2011-08-20 DIAGNOSIS — N3090 Cystitis, unspecified without hematuria: Secondary | ICD-10-CM

## 2011-08-20 LAB — POCT URINALYSIS DIPSTICK
Bilirubin, UA: NEGATIVE
Glucose, UA: NEGATIVE
Spec Grav, UA: 1.02
pH, UA: 6

## 2011-08-20 MED ORDER — CEPHALEXIN 500 MG PO CAPS: 500.0000 mg | ORAL_CAPSULE | Freq: Two times a day (BID) | ORAL | Status: AC

## 2011-08-20 MED ORDER — PHENAZOPYRIDINE HCL 100 MG PO TABS: 100.0000 mg | ORAL_TABLET | Freq: Three times a day (TID) | ORAL | Status: AC | PRN

## 2011-08-20 MED ORDER — FLUCONAZOLE 150 MG PO TABS: 150.0000 mg | ORAL_TABLET | Freq: Once | ORAL | Status: AC

## 2011-08-20 NOTE — Patient Instructions (Addendum)
Take the antibiotics as prescribed,  The pyridum will turn your urine orange Diflucan for yeast Come back in 4 weeks to give a urine sample- with Nurse Visit   Urinary Tract Infection Infections of the urinary tract can start in several places. A bladder infection (cystitis), a kidney infection (pyelonephritis), and a prostate infection (prostatitis) are different types of urinary tract infections (UTIs). They usually get better if treated with medicines (antibiotics) that kill germs. Take all the medicine until it is gone. You or your child may feel better in a few days, but TAKE ALL MEDICINE or the infection may not respond and may become more difficult to treat. HOME CARE INSTRUCTIONS    Drink enough water and fluids to keep the urine clear or pale yellow. Cranberry juice is especially recommended, in addition to large amounts of water.     Avoid caffeine, tea, and carbonated beverages. They tend to irritate the bladder.     Alcohol may irritate the prostate.     Only take over-the-counter or prescription medicines for pain, discomfort, or fever as directed by your caregiver.  To prevent further infections:  Empty the bladder often. Avoid holding urine for long periods of time.     After a bowel movement, women should cleanse from front to back. Use each tissue only once.     Empty the bladder before and after sexual intercourse.  FINDING OUT THE RESULTS OF YOUR TEST Not all test results are available during your visit. If your or your child's test results are not back during the visit, make an appointment with your caregiver to find out the results. Do not assume everything is normal if you have not heard from your caregiver or the medical facility. It is important for you to follow up on all test results. SEEK MEDICAL CARE IF:    There is back pain.     Your baby is older than 3 months with a rectal temperature of 100.5 F (38.1 C) or higher for more than 1 day.     Your or your  child's problems (symptoms) are no better in 3 days. Return sooner if you or your child is getting worse.  SEEK IMMEDIATE MEDICAL CARE IF:    There is severe back pain or lower abdominal pain.     You or your child develops chills.     You have a fever.     Your baby is older than 3 months with a rectal temperature of 102 F (38.9 C) or higher.     Your baby is 69 months old or younger with a rectal temperature of 100.4 F (38 C) or higher.     There is nausea or vomiting.     There is continued burning or discomfort with urination.  MAKE SURE YOU:    Understand these instructions.     Will watch your condition.     Will get help right away if you are not doing well or get worse.  Document Released: 03/21/2005 Document Revised: 02/21/2011 Document Reviewed: 10/24/2006 [facility] Patient Information 2012 [place], [place].

## 2011-08-21 ENCOUNTER — Other Ambulatory Visit: Payer: Self-pay | Admitting: Family Medicine

## 2011-08-21 ENCOUNTER — Encounter: Payer: Self-pay | Admitting: Family Medicine

## 2011-08-21 NOTE — Progress Notes (Signed)
  Subjective:    Patient ID: Brittany Nunez, female    DOB: 11-20-1960, 51 y.o.   MRN: [id number]  HPI For the past 2 days patient has had burning sensation with urination also foul odor. She's been trying to drink more water. She was treated for UTI back in September 2012. She typically only has one UTI a year.  ROS: She denies abdominal pain, fever, nausea, vomiting. Denies change in stools. Denies vaginal discharge   Review of Systems - per above     Objective:   Physical Exam GEN-NAD, alert and oriented ABD- NABS, soft, NT, ND, no CVA tenderness, mild suprapubic discomfort        Assessment & Plan:   Acute cystitis- treat with Keflex pyridium given, increase water intake, cranberry juice recommended. Diflucan as needed for yeast infection post antibiotics  Microscopic hematuria- likely related to acute infection with cystitis, pt has no other risk for bladder cancer, will repeat urine specimen at next visit.

## 2011-08-24 LAB — URINE CULTURE: Colony Count: >=100000

## 2011-09-20 ENCOUNTER — Ambulatory Visit (INDEPENDENT_AMBULATORY_CARE_PROVIDER_SITE_OTHER): Payer: Commercial Managed Care - PPO | Admitting: Family Medicine

## 2011-09-20 DIAGNOSIS — E119 Type 2 diabetes mellitus without complications: Secondary | ICD-10-CM

## 2011-09-20 LAB — HEMOGLOBIN A1C: Mean Plasma Glucose: 123 mg/dL — ABNORMAL HIGH (ref <117)

## 2011-09-23 NOTE — Patient Instructions (Signed)
Pt did not keep appointment, no charge, no encounter notes

## 2011-09-23 NOTE — Progress Notes (Signed)
  Subjective:    Patient ID: Brittany Nunez, female    DOB: 07/19/1960, 51 y.o.   MRN: [id number]  HPI None, pt is a no show   Review of Systems none    Objective:   Physical Exam  none      Assessment & Plan:

## 2011-10-04 ENCOUNTER — Encounter: Payer: Self-pay | Admitting: Family Medicine

## 2011-10-04 ENCOUNTER — Ambulatory Visit (INDEPENDENT_AMBULATORY_CARE_PROVIDER_SITE_OTHER): Payer: Commercial Managed Care - PPO | Admitting: Family Medicine

## 2011-10-04 ENCOUNTER — Other Ambulatory Visit: Payer: Self-pay | Admitting: Family Medicine

## 2011-10-04 VITALS — BP 110/80 | HR 82 | Resp 16 | Ht 66.0 in | Wt 204.8 lb

## 2011-10-04 DIAGNOSIS — Z1211 Encounter for screening for malignant neoplasm of colon: Secondary | ICD-10-CM

## 2011-10-04 DIAGNOSIS — Z139 Encounter for screening, unspecified: Secondary | ICD-10-CM

## 2011-10-04 DIAGNOSIS — E669 Obesity, unspecified: Secondary | ICD-10-CM

## 2011-10-04 DIAGNOSIS — R5383 Other fatigue: Secondary | ICD-10-CM

## 2011-10-04 DIAGNOSIS — N3090 Cystitis, unspecified without hematuria: Secondary | ICD-10-CM

## 2011-10-04 DIAGNOSIS — N951 Menopausal and female climacteric states: Secondary | ICD-10-CM

## 2011-10-04 DIAGNOSIS — R232 Flushing: Secondary | ICD-10-CM

## 2011-10-04 DIAGNOSIS — E119 Type 2 diabetes mellitus without complications: Secondary | ICD-10-CM

## 2011-10-04 DIAGNOSIS — R5381 Other malaise: Secondary | ICD-10-CM

## 2011-10-04 DIAGNOSIS — M25519 Pain in unspecified shoulder: Secondary | ICD-10-CM

## 2011-10-04 DIAGNOSIS — M542 Cervicalgia: Secondary | ICD-10-CM

## 2011-10-04 DIAGNOSIS — M25512 Pain in left shoulder: Secondary | ICD-10-CM

## 2011-10-04 MED ORDER — VENLAFAXINE HCL ER 37.5 MG PO CP24: 37.5000 mg | ORAL_CAPSULE | Freq: Every day | ORAL | Status: DC

## 2011-10-04 MED ORDER — IBUPROFEN 800 MG PO TABS: 800.0000 mg | ORAL_TABLET | Freq: Three times a day (TID) | ORAL | Status: AC | PRN

## 2011-10-04 MED ORDER — GABAPENTIN 800 MG PO TABS: ORAL_TABLET | ORAL | Status: DC

## 2011-10-04 MED ORDER — BACLOFEN 20 MG PO TABS: 20.0000 mg | ORAL_TABLET | Freq: Two times a day (BID) | ORAL | Status: DC

## 2011-10-04 MED ORDER — METHYLPREDNISOLONE ACETATE 80 MG/ML IJ SUSP
80.0000 mg | Freq: Once | INTRAMUSCULAR | Status: AC
  Administered 2011-10-04: 80 mg via INTRAMUSCULAR

## 2011-10-04 MED ORDER — KETOROLAC TROMETHAMINE 60 MG/2ML IM SOLN
60.0000 mg | Freq: Once | INTRAMUSCULAR | Status: AC
  Administered 2011-10-04: 60 mg via INTRAMUSCULAR

## 2011-10-04 MED ORDER — PREDNISONE (PAK) 5 MG PO TABS: 5.0000 mg | ORAL_TABLET | ORAL | Status: DC

## 2011-10-04 MED ORDER — DIAZEPAM 2 MG PO TABS: ORAL_TABLET | ORAL | Status: DC

## 2011-10-04 MED ORDER — MELOXICAM 15 MG PO TABS: ORAL_TABLET | ORAL | Status: DC

## 2011-10-04 NOTE — Progress Notes (Signed)
Pt was referral electronic by Dr. Lodema Hong today for a colonoscopy. Mailed pt with the info that she needs to have to call and get scheduled.

## 2011-10-04 NOTE — Assessment & Plan Note (Signed)
CCUA in office today is normal, pt reassured

## 2011-10-04 NOTE — Patient Instructions (Addendum)
CPE in 4 month  Urine is normal You are referred for colonscopy and mammogram both are over due.  You will get information on menopause and hot flashes, and medication is also prescribed.  Injections in office and medication for left shoulder pain. No baclofen for 7 days while on valium  Blood sugar is excellent, please continue to work on weight loss  HBa1C and chem 7 in 4 month, non fasting

## 2011-10-04 NOTE — Progress Notes (Signed)
  Subjective:    Patient ID: Brittany Nunez, female    DOB: [birth year], 51 y.o.   MRN: [id number]  HPI The PT is here for follow up and re-evaluation of chronic medical conditions, medication management and review of any available recent lab and radiology data.  Preventive health is updated, specifically  Cancer screening and Immunization.   Questions or concerns regarding consultations or procedures which the PT has had in the interim are  addressed. The PT denies any adverse reactions to current medications since the last visit.  C/o increased and uncontrolled hot flashes x 4 month  1 day h/o left posterior shoulder pain worse with moving the neck     Review of Systems See HPI Denies recent fever or chills. Denies sinus pressure, nasal congestion, ear pain or sore throat. Denies chest congestion, productive cough or wheezing. Denies chest pains, palpitations and leg swelling Denies abdominal pain, nausea, vomiting,diarrhea or constipation.   C/o mild dysuriarecently treated for UTI and has concerns about a recurrence, wants urine checked., denies  hesitancy or incontinence. . Denies headaches, seizures, numbness, or tingling. Denies depression, anxiety or insomnia. Denies skin break down or rash.       Objective:   Physical Exam Patient alert and oriented and in no cardiopulmonary distress.  HEENT: No facial asymmetry, EOMI, no sinus tenderness,  oropharynx pink and moist.  Neck decreased ROM no adenopathy.  Chest: Clear to auscultation bilaterally.  CVS: S1, S2 no murmurs, no S3.  ABD: Soft non tender. Bowel sounds normal.  Ext: No edema  MS: decreased  ROM spine,also in  Shoulders,adequate in  hips and knees.  Skin: Intact, no ulcerations or rash noted.  Psych: Good eye contact, normal affect. Memory intact not anxious or depressed appearing.  CNS: CN 2-12 intact, power, tone and sensation normal throughout.        Assessment & Plan:

## 2011-10-07 NOTE — Assessment & Plan Note (Signed)
Increased and unbearable, medication prescribed, alo counseled r the natural approach to perimenopausal symptoms

## 2011-10-07 NOTE — Assessment & Plan Note (Signed)
Improved. Pt applauded on succesful weight loss through lifestyle change, and encouraged to continue same. Weight loss goal set for the next several months.  

## 2011-10-07 NOTE — Assessment & Plan Note (Signed)
Controlled, no change in medication  

## 2011-10-07 NOTE — Assessment & Plan Note (Signed)
Increased and uncontrolled anti inflammatories and muscle relaxants administered and prescribed

## 2011-10-08 ENCOUNTER — Ambulatory Visit (HOSPITAL_COMMUNITY)
Admission: RE | Admit: 2011-10-08 | Discharge: 2011-10-08 | Disposition: A | Discharge status: Home / Self Care | Payer: Commercial Managed Care - PPO | Source: Ambulatory Visit | Attending: Family Medicine | Admitting: Family Medicine

## 2011-10-08 DIAGNOSIS — Z1231 Encounter for screening mammogram for malignant neoplasm of breast: Secondary | ICD-10-CM | POA: Insufficient documentation

## 2011-10-08 DIAGNOSIS — Z139 Encounter for screening, unspecified: Secondary | ICD-10-CM

## 2011-10-25 ENCOUNTER — Telehealth: Payer: Self-pay | Admitting: Family Medicine

## 2011-10-25 MED ORDER — TEMAZEPAM 30 MG PO CAPS: 30.0000 mg | ORAL_CAPSULE | Freq: Every evening | ORAL | Status: DC | PRN

## 2011-10-25 NOTE — Telephone Encounter (Signed)
Printed rx to be faxed

## 2011-10-29 ENCOUNTER — Telehealth: Payer: Self-pay

## 2011-10-29 NOTE — Telephone Encounter (Signed)
Gastroenterology Pre-Procedure Form  Pt not scheduled yet. ( Does she need OV first)  Request Date: 10/29/2011       Requesting Physician: Dr. Lodema Hong     PATIENT INFORMATION:  Brittany Nunez is a 51 y.o., female (DOB=16-Jan-1961).  PROCEDURE: Procedure(s) requested: colonoscopy Procedure Reason: screening for colon cancer  PATIENT REVIEW QUESTIONS: The patient reports the following:   1. Diabetes Melitis: yes  2. Joint replacements in the past 12 months: no 3. Major health problems in the past 3 months: no 4. Has an artificial valve or MVP:no 5. Has been advised in past to take antibiotics in advance of a procedure like teeth cleaning: no}    MEDICATIONS & ALLERGIES:    Patient reports the following regarding taking any blood thinners:   Plavix? no Aspirin?no Coumadin?  no  Patient confirms/reports the following medications:  Current Outpatient Prescriptions  Medication Sig Dispense Refill  . baclofen (LIORESAL) 20 MG tablet Take 1 tablet (20 mg total) by mouth 2 (two) times daily. 1 by mouth twice a day  60 each  3  . diazepam (VALIUM) 2 MG tablet One tablet every 8 hours for muscle spasm  21 tablet  0  . gabapentin (NEURONTIN) 800 MG tablet TAKE ONE TABLET BY MOUTH THREE TIMES DAILY  90 tablet  3  . meloxicam (MOBIC) 15 MG tablet TAKE ONE TABLET BY MOUTH EVERY DAY  30 tablet  3  . Saxagliptin-Metformin (KOMBIGLYZE XR) 2.10-998 MG TB24       . temazepam (RESTORIL) 30 MG capsule Take 1 capsule (30 mg total) by mouth at bedtime as needed.  30 capsule  3  . venlafaxine (EFFEXOR XR) 37.5 MG 24 hr capsule Take 1 capsule (37.5 mg total) by mouth daily.  30 capsule  4  . DISCONTD: KOMBIGLYZE XR 2.10-998 MG TB24 TWO TABLETS ONCE DAILY EFFECTIVE 08/21/2010, DISCONTINUE METFORMIN  60 tablet  2  . glucose blood (ACCU-CHEK AVIVA) test strip Use as directed for twice daily testing (90day supply)  180 each  3  . hydrOXYzine (ATARAX) 25 MG tablet TAKE 1 TABLET (25 MG TOTAL) BY MOUTH 3 (THREE)  TIMES DAILY AS NEEDEDFOR ITCHING  30 tablet  3  . nystatin (MYCOSTATIN) powder Apply to affected area 3 times daily  60 g  0  . predniSONE (STERAPRED UNI-PAK) 5 MG TABS Take 1 tablet (5 mg total) by mouth as directed.  21 tablet  0   Current Facility-Administered Medications  Medication Dose Route Frequency Provider Last Rate Last Dose  . Influenza (>/= 3 years) inactive virus vaccine (FLVIRIN/FLUZONE) injection SUSP 0.5 mL  0.5 mL Intramuscular Once Kerri Perches, MD        Patient confirms/reports the following allergies:  Allergies  Allergen Reactions  . Sulfonamide Derivatives     Patient is appropriate to schedule for requested procedure(s): yes  AUTHORIZATION INFORMATION Primary Insurance: ID #:   Group #:  Pre-Cert / Auth required: Pre-Cert / Auth #:   Secondary Insurance:   ID #:   Group #:  Pre-Cert / Auth required: Pre-Cert / Auth #:   No orders of the defined types were placed in this encounter.    SCHEDULE INFORMATION: Procedure has been scheduled as follows:  Date:                Time  This Gastroenterology Pre-Precedure Form is being routed to the following provider(s) for review: R. Roetta Sessions, MD

## 2011-10-29 NOTE — Telephone Encounter (Signed)
LMOM to call.

## 2011-10-29 NOTE — Telephone Encounter (Signed)
Multiple medications and h/o IDA. Recommend OV.

## 2011-10-30 NOTE — Telephone Encounter (Signed)
Pt is scheduled for OV with KJ at 10:00 Am on 10/31/2011.

## 2011-10-31 ENCOUNTER — Encounter: Payer: Self-pay | Admitting: Urgent Care

## 2011-10-31 ENCOUNTER — Ambulatory Visit (INDEPENDENT_AMBULATORY_CARE_PROVIDER_SITE_OTHER): Payer: Commercial Managed Care - PPO | Admitting: Urgent Care

## 2011-10-31 VITALS — BP 136/81 | HR 89 | Temp 97.6°F | Ht 66.0 in | Wt 205.2 lb

## 2011-10-31 DIAGNOSIS — D649 Anemia, unspecified: Secondary | ICD-10-CM

## 2011-10-31 HISTORY — DX: Anemia, unspecified: D64.9

## 2011-10-31 LAB — FOLATE: Folate: 11.2 ng/mL

## 2011-10-31 LAB — CBC WITH DIFFERENTIAL/PLATELET
Eosinophils Absolute: 0.2 10*3/uL (ref 0.0–0.7)
Eosinophils Relative: 2 % (ref 0–5)
HCT: 36.5 % (ref 36.0–46.0)
Hemoglobin: 11.4 g/dL — ABNORMAL LOW (ref 12.0–15.0)
Lymphs Abs: 2.2 10*3/uL (ref 0.7–4.0)
MCH: 25.4 pg — ABNORMAL LOW (ref 26.0–34.0)
MCV: 81.3 fL (ref 78.0–100.0)
Monocytes Absolute: 0.5 10*3/uL (ref 0.1–1.0)
Monocytes Relative: 8 % (ref 3–12)
Neutrophils Relative %: 55 % (ref 43–77)
RBC: 4.49 MIL/uL (ref 3.87–5.11)

## 2011-10-31 LAB — IRON AND TIBC
Iron: 45 ug/dL (ref 42–145)
TIBC: 312 ug/dL (ref 250–470)
UIBC: 267 ug/dL (ref 125–400)

## 2011-10-31 LAB — VITAMIN B12: Vitamin B-12: 182 pg/mL — ABNORMAL LOW (ref 211–911)

## 2011-10-31 NOTE — Assessment & Plan Note (Addendum)
Brittany Nunez is a pleasant 51 y.o. female with history of chronic anemia that dates back many years. She did, however, have a normal hemoglobin one-two years ago, but anemia prior to that in review of old records.  She is overdue for screening colonoscopy. Hemoccult status is unknown. She has no gross GI bleeding and is actually devoid of any GI symptoms. She is on 2 different NSAIDs.  Anemia panel and CBC to sort this out further Hemoccult stools x3 Colonoscopy in the near future with Dr. Darrick Penna.  Procedure will need to be done with deep sedation (propofol) in the OR under the direction of anesthesia services for polypharmacy. I did discuss the fact that if she is found to have iron deficiency or Hemoccult positive, she will an EGD at the same time as the colonoscopy if nothing is found on the colonoscopy to explain this.  I have discussed risks & benefits which include, but are not limited to, bleeding, infection, perforation & drug reaction.  The patient agrees with this plan & written consent will be obtained.

## 2011-10-31 NOTE — Progress Notes (Signed)
REVIEWED. AGREE. 
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
## 2011-10-31 NOTE — Patient Instructions (Signed)
Please return your stool cards to our office as soon as possible To get your lab work today You will need a colonoscopy and possibly an upper endoscopy (EGD) with Dr. fields in near future We will call you with further recommendations.

## 2011-11-01 NOTE — Progress Notes (Signed)
Quick Note:  Await Hemoccult cards-iron and ferritin low normal. She has a mild anemia. She has B12 deficiency. She will need followup with [name], MD for this. CC: [name], MD  ______

## 2011-11-05 ENCOUNTER — Telehealth: Payer: Self-pay | Admitting: Urgent Care

## 2011-11-05 ENCOUNTER — Ambulatory Visit: Payer: Commercial Managed Care - PPO | Admitting: Urgent Care

## 2011-11-05 ENCOUNTER — Other Ambulatory Visit: Payer: Self-pay | Admitting: Gastroenterology

## 2011-11-05 DIAGNOSIS — D649 Anemia, unspecified: Secondary | ICD-10-CM

## 2011-11-05 LAB — POC HEMOCCULT BLD/STL (HOME/3-CARD/SCREEN): Card #3 Fecal Occult Blood, POC: NEGATIVE

## 2011-11-05 MED ORDER — PEG-KCL-NACL-NASULF-NA ASC-C 100 G PO SOLR: 1.0000 | Freq: Once | ORAL | Status: DC

## 2011-11-05 NOTE — Telephone Encounter (Signed)
Noted  

## 2011-11-05 NOTE — Progress Notes (Signed)
Quick Note:  Informed pt. OK to schedule. ______

## 2011-11-05 NOTE — Progress Notes (Signed)
Quick Note:  Please call patient. There is no blood in her stools. Her iron and ferritin are low normal.  She has a mild anemia. She has B12 deficiency.  She will need followup with [name], MD for this.  Please set up colonoscopy +/- EGD with Dr. Darrick Penna in OR in near future. We did discuss this at OV already. Thanks    ______

## 2011-11-05 NOTE — Telephone Encounter (Signed)
Please call patient to see if the Hemoccult cards can be turned in as soon as possible. We need to get her set up for procedures. thanks

## 2011-11-05 NOTE — Telephone Encounter (Signed)
Pt said she is going to return today.

## 2011-11-08 ENCOUNTER — Encounter (HOSPITAL_COMMUNITY): Payer: Self-pay

## 2011-11-14 NOTE — Patient Instructions (Addendum)
20 Brittany Nunez  11/14/2011   Your procedure is scheduled on:  11/22/2011  Report to [facility] at  710  AM.  Call this number if you have problems the morning of surgery: [phone]   Remember:   Do not eat food:After Midnight.  May have clear liquids:until Midnight .    Take these medicines the morning of surgery with A SIP OF WATER: neurontin,effexor,valium,mobic   Do not wear jewelry, make-up or nail polish.  Do not wear lotions, powders, or perfumes. You may wear deodorant.  Do not shave 48 hours prior to surgery. Men may shave face and neck.  Do not bring valuables to the hospital.  Contacts, dentures or bridgework may not be worn into surgery.  Leave suitcase in the car. After surgery it may be brought to your room.  For patients admitted to the hospital, checkout time is 11:00 AM the day of discharge.   Patients discharged the day of surgery will not be allowed to drive home.  Name and phone number of your driver: family  Special Instructions: N/A   Please read over the following fact sheets that you were given: Pain Booklet, Surgical Site Infection Prevention, Anesthesia Post-op Instructions and Care and Recovery After Surgery Colonoscopy A colonoscopy is an exam to evaluate your entire colon. In this exam, your colon is cleansed. A long fiberoptic tube is inserted through your rectum and into your colon. The fiberoptic scope (endoscope) is a long bundle of enclosed and very flexible fibers. These fibers transmit light to the area examined and send images from that area to your caregiver. Discomfort is usually minimal. You may be given a drug to help you sleep (sedative) during or prior to the procedure. This exam helps to detect lumps (tumors), polyps, inflammation, and areas of bleeding. Your caregiver may also take a small piece of tissue (biopsy) that will be examined under a microscope. LET YOUR CAREGIVER KNOW ABOUT:   Allergies to food or medicine.   Medicines taken,  including vitamins, herbs, eyedrops, over-the-counter medicines, and creams.   Use of steroids (by mouth or creams).   Previous problems with anesthetics or numbing medicines.   History of bleeding problems or blood clots.   Previous surgery.   Other health problems, including diabetes and kidney problems.   Possibility of pregnancy, if this applies.  BEFORE THE PROCEDURE   A clear liquid diet may be required for 2 days before the exam.   Ask your caregiver about changing or stopping your regular medications.   Liquid injections (enemas) or laxatives may be required.   A large amount of electrolyte solution may be given to you to drink over a short period of time. This solution is used to clean out your colon.   You should be present 60 minutes prior to your procedure or as directed by your caregiver.  AFTER THE PROCEDURE   If you received a sedative or pain relieving medication, you will need to arrange for someone to drive you home.   Occasionally, there is a little blood passed with the first bowel movement. Do not be concerned.  FINDING OUT THE RESULTS OF YOUR TEST Not all test results are available during your visit. If your test results are not back during the visit, make an appointment with your caregiver to find out the results. Do not assume everything is normal if you have not heard from your caregiver or the medical facility. It is important for you to follow up on  all of your test results. HOME CARE INSTRUCTIONS   It is not unusual to pass moderate amounts of gas and experience mild abdominal cramping following the procedure. This is due to air being used to inflate your colon during the exam. Walking or a warm pack on your belly (abdomen) may help.   You may resume all normal meals and activities after sedatives and medicines have worn off.   Only take over-the-counter or prescription medicines for pain, discomfort, or fever as directed by your caregiver. Do not use  aspirin or blood thinners if a biopsy was taken. Consult your caregiver for medicine usage if biopsies were taken.  SEEK IMMEDIATE MEDICAL CARE IF:   You have a fever.   You pass large blood clots or fill a toilet with blood following the procedure. This may also occur 10 to 14 days following the procedure. This is more likely if a biopsy was taken.   You develop abdominal pain that keeps getting worse and cannot be relieved with medicine.  Document Released: 06/08/2000 Document Revised: 05/31/2011 Document Reviewed: 01/22/2008 [facility] Patient Information 2012 [place], [place].Esophagogastroduodenoscopy This is an endoscopic procedure (a procedure that uses a device like a flexible telescope) that allows your caregiver to view the upper stomach and small bowel. This test allows your caregiver to look at the esophagus. The esophagus carries food from your mouth to your stomach. They can also look at your duodenum. This is the first part of the small intestine that attaches to the stomach. This test is used to detect problems in the bowel such as ulcers and inflammation. PREPARATION FOR TEST Nothing to eat after midnight the day before the test. NORMAL FINDINGS Normal esophagus, stomach, and duodenum. Ranges for normal findings may vary among different laboratories and hospitals. You should always check with your doctor after having lab work or other tests done to discuss the meaning of your test results and whether your values are considered within normal limits. MEANING OF TEST  Your caregiver will go over the test results with you and discuss the importance and meaning of your results, as well as treatment options and the need for additional tests if necessary. OBTAINING THE TEST RESULTS It is your responsibility to obtain your test results. Ask the lab or department performing the test when and how you will get your results. Document Released: 10/12/2004 Document Revised: 05/31/2011 Document  Reviewed: 05/21/2008 [facility] Patient Information 2012 [place], [place].PATIENT INSTRUCTIONS POST-ANESTHESIA  IMMEDIATELY FOLLOWING SURGERY:  Do not drive or operate machinery for the first twenty four hours after surgery.  Do not make any important decisions for twenty four hours after surgery or while taking narcotic pain medications or sedatives.  If you develop intractable nausea and vomiting or a severe headache please notify your doctor immediately.  FOLLOW-UP:  Please make an appointment with your surgeon as instructed. You do not need to follow up with anesthesia unless specifically instructed to do so.  WOUND CARE INSTRUCTIONS (if applicable):  Keep a dry clean dressing on the anesthesia/puncture wound site if there is drainage.  Once the wound has quit draining you may leave it open to air.  Generally you should leave the bandage intact for twenty four hours unless there is drainage.  If the epidural site drains for more than 36-48 hours please call the anesthesia department.  QUESTIONS?:  Please feel free to call your physician or the hospital operator if you have any questions, and they will be happy to assist you.  [facility] Anesthesia Department [address] [place] [phone]

## 2011-11-15 ENCOUNTER — Encounter (HOSPITAL_COMMUNITY)
Admission: RE | Admit: 2011-11-15 | Discharge: 2011-11-15 | Disposition: A | Discharge status: Home / Self Care | Payer: Commercial Managed Care - PPO | Source: Ambulatory Visit | Attending: Gastroenterology | Admitting: Gastroenterology

## 2011-11-15 ENCOUNTER — Other Ambulatory Visit: Payer: Self-pay

## 2011-11-15 ENCOUNTER — Encounter (HOSPITAL_COMMUNITY): Payer: Self-pay

## 2011-11-15 ENCOUNTER — Telehealth: Payer: Self-pay

## 2011-11-15 HISTORY — DX: Anxiety disorder, unspecified: F41.9

## 2011-11-15 HISTORY — DX: Essential (primary) hypertension: I10

## 2011-11-15 LAB — BASIC METABOLIC PANEL
BUN: 13 mg/dL (ref 6–23)
CO2: 29 mEq/L (ref 19–32)
Calcium: 9.2 mg/dL (ref 8.4–10.5)
Creatinine, Ser: 0.87 mg/dL (ref 0.50–1.10)
GFR calc non Af Amer: 75 mL/min — ABNORMAL LOW (ref >90)
Glucose, Bld: 102 mg/dL — ABNORMAL HIGH (ref 70–99)

## 2011-11-15 LAB — HEMOGLOBIN AND HEMATOCRIT, BLOOD: HCT: 33.8 % — ABNORMAL LOW (ref 36.0–46.0)

## 2011-11-15 NOTE — Telephone Encounter (Signed)
Faxed Rx and instructions for Movie prep to [facility]. Pt was aware that I was faxing.

## 2011-11-22 ENCOUNTER — Encounter (HOSPITAL_COMMUNITY): Payer: Self-pay | Admitting: Anesthesiology

## 2011-11-22 ENCOUNTER — Ambulatory Visit (HOSPITAL_COMMUNITY): Payer: Commercial Managed Care - PPO | Admitting: Anesthesiology

## 2011-11-22 ENCOUNTER — Encounter (HOSPITAL_COMMUNITY)
Admission: RE | Disposition: A | Discharge status: Home / Self Care | Payer: Self-pay | Source: Ambulatory Visit | Attending: Gastroenterology

## 2011-11-22 ENCOUNTER — Ambulatory Visit (HOSPITAL_COMMUNITY)
Admission: RE | Admit: 2011-11-22 | Discharge: 2011-11-22 | Disposition: A | Discharge status: Home / Self Care | Payer: Commercial Managed Care - PPO | Source: Ambulatory Visit | Attending: Gastroenterology | Admitting: Gastroenterology

## 2011-11-22 ENCOUNTER — Encounter (HOSPITAL_COMMUNITY): Payer: Self-pay | Admitting: *Deleted

## 2011-11-22 DIAGNOSIS — D509 Iron deficiency anemia, unspecified: Secondary | ICD-10-CM | POA: Insufficient documentation

## 2011-11-22 DIAGNOSIS — K2970 Gastritis, unspecified, without bleeding: Secondary | ICD-10-CM | POA: Diagnosis not present

## 2011-11-22 DIAGNOSIS — K2940 Chronic atrophic gastritis without bleeding: Secondary | ICD-10-CM | POA: Diagnosis not present

## 2011-11-22 DIAGNOSIS — Z79899 Other long term (current) drug therapy: Secondary | ICD-10-CM | POA: Insufficient documentation

## 2011-11-22 DIAGNOSIS — K5730 Diverticulosis of large intestine without perforation or abscess without bleeding: Secondary | ICD-10-CM

## 2011-11-22 DIAGNOSIS — Z01812 Encounter for preprocedural laboratory examination: Secondary | ICD-10-CM | POA: Insufficient documentation

## 2011-11-22 DIAGNOSIS — K449 Diaphragmatic hernia without obstruction or gangrene: Secondary | ICD-10-CM | POA: Diagnosis not present

## 2011-11-22 DIAGNOSIS — K2990 Gastroduodenitis, unspecified, without bleeding: Secondary | ICD-10-CM | POA: Diagnosis not present

## 2011-11-22 DIAGNOSIS — I10 Essential (primary) hypertension: Secondary | ICD-10-CM | POA: Insufficient documentation

## 2011-11-22 HISTORY — PX: BIOPSY: SHX5522

## 2011-11-22 LAB — GLUCOSE, CAPILLARY: Glucose-Capillary: 92 mg/dL (ref 70–99)

## 2011-11-22 SURGERY — COLONOSCOPY WITH PROPOFOL
Anesthesia: Monitor Anesthesia Care | Site: Esophagus

## 2011-11-22 MED ORDER — LACTATED RINGERS IV SOLN
INTRAVENOUS | Status: DC | PRN
  Administered 2011-11-22 (×2): via INTRAVENOUS

## 2011-11-22 MED ORDER — ONDANSETRON HCL 4 MG/2ML IJ SOLN
INTRAMUSCULAR | Status: AC
  Filled 2011-11-22: qty 2

## 2011-11-22 MED ORDER — STERILE WATER FOR IRRIGATION IR SOLN
Status: DC | PRN
  Administered 2011-11-22: 09:00:00

## 2011-11-22 MED ORDER — ONDANSETRON HCL 4 MG/2ML IJ SOLN
4.0000 mg | Freq: Once | INTRAMUSCULAR | Status: AC
  Administered 2011-11-22: 4 mg via INTRAVENOUS

## 2011-11-22 MED ORDER — GLYCOPYRROLATE 0.2 MG/ML IJ SOLN
INTRAMUSCULAR | Status: AC
  Filled 2011-11-22: qty 1

## 2011-11-22 MED ORDER — GLYCOPYRROLATE 0.2 MG/ML IJ SOLN
0.2000 mg | Freq: Once | INTRAMUSCULAR | Status: AC
  Administered 2011-11-22: 0.2 mg via INTRAVENOUS

## 2011-11-22 MED ORDER — FENTANYL CITRATE 0.05 MG/ML IJ SOLN
INTRAMUSCULAR | Status: DC | PRN
  Administered 2011-11-22: 50 ug via INTRAVENOUS

## 2011-11-22 MED ORDER — BUTAMBEN-TETRACAINE-BENZOCAINE 2-2-14 % EX AERO
1.0000 | INHALATION_SPRAY | Freq: Once | CUTANEOUS | Status: AC
  Administered 2011-11-22: 1 via TOPICAL
  Filled 2011-11-22: qty 56

## 2011-11-22 MED ORDER — PROPOFOL 10 MG/ML IV EMUL
INTRAVENOUS | Status: DC | PRN
  Administered 2011-11-22: 75 ug/kg/min via INTRAVENOUS

## 2011-11-22 MED ORDER — LIDOCAINE HCL (CARDIAC) 10 MG/ML IV SOLN
INTRAVENOUS | Status: DC | PRN
  Administered 2011-11-22: 50 mg via INTRAVENOUS

## 2011-11-22 MED ORDER — OMEPRAZOLE 20 MG PO CPDR: DELAYED_RELEASE_CAPSULE | ORAL | Status: DC

## 2011-11-22 MED ORDER — LACTATED RINGERS IV SOLN
INTRAVENOUS | Status: DC
  Administered 2011-11-22: 1000 mL via INTRAVENOUS

## 2011-11-22 MED ORDER — PROPOFOL 10 MG/ML IV EMUL
INTRAVENOUS | Status: AC
  Filled 2011-11-22: qty 20

## 2011-11-22 MED ORDER — LIDOCAINE HCL (PF) 1 % IJ SOLN
INTRAMUSCULAR | Status: AC
  Filled 2011-11-22: qty 5

## 2011-11-22 MED ORDER — MIDAZOLAM HCL 2 MG/2ML IJ SOLN
INTRAMUSCULAR | Status: AC
  Filled 2011-11-22: qty 2

## 2011-11-22 MED ORDER — FENTANYL CITRATE 0.05 MG/ML IJ SOLN: 25.0000 ug | INTRAMUSCULAR | Status: DC | PRN

## 2011-11-22 MED ORDER — FENTANYL CITRATE 0.05 MG/ML IJ SOLN
INTRAMUSCULAR | Status: AC
  Filled 2011-11-22: qty 2

## 2011-11-22 MED ORDER — MIDAZOLAM HCL 5 MG/5ML IJ SOLN
INTRAMUSCULAR | Status: DC | PRN
  Administered 2011-11-22: 2 mg via INTRAVENOUS

## 2011-11-22 MED ORDER — MIDAZOLAM HCL 2 MG/2ML IJ SOLN
1.0000 mg | INTRAMUSCULAR | Status: DC | PRN
  Administered 2011-11-22: 2 mg via INTRAVENOUS

## 2011-11-22 MED ORDER — ONDANSETRON HCL 4 MG/2ML IJ SOLN: 4.0000 mg | Freq: Once | INTRAMUSCULAR | Status: DC | PRN

## 2011-11-22 SURGICAL SUPPLY — 22 items
BLOCK BITE 60FR ADLT L/F BLUE (MISCELLANEOUS) ×3 IMPLANT
ELECT REM PT RETURN 9FT ADLT (ELECTROSURGICAL)
ELECTRODE REM PT RTRN 9FT ADLT (ELECTROSURGICAL) IMPLANT
FCP BXJMBJMB 240X2.8X (CUTTING FORCEPS)
FLOOR PAD 36X40 (MISCELLANEOUS) ×3
FORCEPS BIOP RAD 4 LRG CAP 4 (CUTTING FORCEPS) ×3 IMPLANT
FORCEPS BIOP RJ4 240 W/NDL (CUTTING FORCEPS)
FORCEPS BXJMBJMB 240X2.8X (CUTTING FORCEPS) IMPLANT
INJECTOR/SNARE I SNARE (MISCELLANEOUS) IMPLANT
LUBRICANT JELLY 4.5OZ STERILE (MISCELLANEOUS) ×3 IMPLANT
MANIFOLD NEPTUNE II (INSTRUMENTS) ×3 IMPLANT
NEEDLE SCLEROTHERAPY 25GX240 (NEEDLE) IMPLANT
PAD FLOOR 36X40 (MISCELLANEOUS) ×2 IMPLANT
PROBE APC STR FIRE (PROBE) IMPLANT
PROBE INJECTION GOLD (MISCELLANEOUS)
PROBE INJECTION GOLD 7FR (MISCELLANEOUS) IMPLANT
SNARE SHORT THROW 13M SML OVAL (MISCELLANEOUS) ×3 IMPLANT
SYR 50ML LL SCALE MARK (SYRINGE) ×3 IMPLANT
TRAP SPECIMEN MUCOUS 40CC (MISCELLANEOUS) IMPLANT
TUBING ENDO SMARTCAP PENTAX (MISCELLANEOUS) ×3 IMPLANT
TUBING IRRIGATION ENDOGATOR (MISCELLANEOUS) ×3 IMPLANT
WATER STERILE IRR 1000ML POUR (IV SOLUTION) ×3 IMPLANT

## 2011-11-22 NOTE — Preoperative (Signed)
Beta Blockers   Reason not to administer Beta Blockers:Not Applicable 

## 2011-11-22 NOTE — Discharge Instructions (Signed)
You have diverticulosis THROUGHOUT YOUR COLON. You have gastritis MOST LIKELY DUE TO MOBIC. I biopsied your stomach & duodenum.    START OMEPRAZOLE EVERY MORNING.  AVOID ITEMS THAT TRIGGER GASTRITIS. SEE INFO BELOW.  FOLLOW A HIGH FIBER/LOW FAT DIET. AVOID ITEMS THAT CAUSE BLOATING. SEE INFO BELOW.   YOUR BIOPSY RESULTS WILL BE AVAILABLE IN 7 DAYS. FOLLOW UP IN 4 MOS AND I WILL RECHECK YOUR BLOOD COUNT. Next colonoscopy in 10 years. ENDOSCOPY Care After Read the instructions outlined below and refer to this sheet in the next week. These discharge instructions provide you with general information on caring for yourself after you leave the hospital. While your treatment has been planned according to the most current medical practices available, unavoidable complications occasionally occur. If you have any problems or questions after discharge, call DR. Susane Bey, [phone].  ACTIVITY  You may resume your regular activity, but move at a slower pace for the next 24 hours.   Take frequent rest periods for the next 24 hours.   Walking will help get rid of the air and reduce the bloated feeling in your belly (abdomen).   No driving for 24 hours (because of the medicine (anesthesia) used during the test).   You may shower.   Do not sign any important legal documents or operate any machinery for 24 hours (because of the anesthesia used during the test).    NUTRITION  Drink plenty of fluids.   You may resume your normal diet as instructed by your doctor.   Begin with a light meal and progress to your normal diet. Heavy or fried foods are harder to digest and may make you feel sick to your stomach (nauseated).   Avoid alcoholic beverages for 24 hours or as instructed.    MEDICATIONS  You may resume your normal medications.   WHAT YOU CAN EXPECT TODAY  Some feelings of bloating in the abdomen.   Passage of more gas than usual.   Spotting of blood in your stool or on the  toilet paper  .  IF YOU HAD POLYPS REMOVED DURING THE ENDOSCOPY:  Eat a soft diet IF YOU HAVE NAUSEA, BLOATING, ABDOMINAL PAIN, OR VOMITING.    FINDING OUT THE RESULTS OF YOUR TEST Not all test results are available during your visit. DR. Darrick Penna WILL CALL YOU WITHIN 7 DAYS OF YOUR PROCEDUE WITH YOUR RESULTS. Do not assume everything is normal if you have not heard from DR. Kylan Veach IN ONE WEEK, CALL HER OFFICE AT [phone].  SEEK IMMEDIATE MEDICAL ATTENTION AND CALL THE OFFICE: [phone] IF:  You have more than a spotting of blood in your stool.   Your belly is swollen (abdominal distention).   You are nauseated or vomiting.   You have a temperature over 101F.   You have abdominal pain or discomfort that is severe or gets worse throughout the day.   Gastritis  Gastritis is an inflammation (the body's way of reacting to injury and/or infection) of the stomach. It is often caused by viral or bacterial (germ) infections. It can also be caused BY ASPIRIN, BC/GOODY POWDER'S, (IBUPROFEN) MOTRIN, OR ALEVE (NAPROXEN), chemicals (including alcohol), SPICY FOODS, and medications. This illness may be associated with generalized malaise (feeling tired, not well), UPPER ABDOMINAL STOMACH cramps, and fever. One common bacterial cause of gastritis is an organism known as H. Pylori. This can be treated with antibiotics.    High-Fiber Diet A high-fiber diet changes your normal diet to include more whole grains, legumes,  fruits, and vegetables. Changes in the diet involve replacing refined carbohydrates with unrefined foods. The calorie level of the diet is essentially unchanged. The Dietary Reference Intake (recommended amount) for adult males is 38 grams per day. For adult females, it is 25 grams per day. Pregnant and lactating women should consume 28 grams of fiber per day. Fiber is the intact part of a plant that is not broken down during digestion. Functional fiber is fiber that has been  isolated from the plant to provide a beneficial effect in the body. PURPOSE  Increase stool bulk.   Ease and regulate bowel movements.   Lower cholesterol.  INDICATIONS THAT YOU NEED MORE FIBER  Constipation and hemorrhoids.   Uncomplicated diverticulosis (intestine condition) and irritable bowel syndrome.   Weight management.   As a protective measure against hardening of the arteries (atherosclerosis), diabetes, and cancer.   GUIDELINES FOR INCREASING FIBER IN THE DIET  Start adding fiber to the diet slowly. A gradual increase of about 5 more grams (2 slices of whole-wheat bread, 2 servings of most fruits or vegetables, or 1 bowl of high-fiber cereal) per day is best. Too rapid an increase in fiber may result in constipation, flatulence, and bloating.   Drink enough water and fluids to keep your urine clear or pale yellow. Water, juice, or caffeine-free drinks are recommended. Not drinking enough fluid may cause constipation.   Eat a variety of high-fiber foods rather than one type of fiber.   Try to increase your intake of fiber through using high-fiber foods rather than fiber pills or supplements that contain small amounts of fiber.   The goal is to change the types of food eaten. Do not supplement your present diet with high-fiber foods, but replace foods in your present diet.  INCLUDE A VARIETY OF FIBER SOURCES  Replace refined and processed grains with whole grains, canned fruits with fresh fruits, and incorporate other fiber sources. White rice, white breads, and most bakery goods contain little or no fiber.   Brown whole-grain rice, buckwheat oats, and many fruits and vegetables are all good sources of fiber. These include: broccoli, Brussels sprouts, cabbage, cauliflower, beets, sweet potatoes, white potatoes (skin on), carrots, tomatoes, eggplant, squash, berries, fresh fruits, and dried fruits.   Cereals appear to be the richest source of fiber. Cereal fiber is found in  whole grains and bran. Bran is the fiber-rich outer coat of cereal grain, which is largely removed in refining. In whole-grain cereals, the bran remains. In breakfast cereals, the largest amount of fiber is found in those with "bran" in their names. The fiber content is sometimes indicated on the label.   You may need to include additional fruits and vegetables each day.   In baking, for 1 cup white flour, you may use the following substitutions:   1 cup whole-wheat flour minus 2 tablespoons.   1/2 cup white flour plus 1/2 cup whole-wheat flour.   Low-Fat Diet BREADS, CEREALS, PASTA, RICE, DRIED PEAS, AND BEANS These products are high in carbohydrates and most are low in fat. Therefore, they can be increased in the diet as substitutes for fatty foods. They too, however, contain calories and should not be eaten in excess. Cereals can be eaten for snacks as well as for breakfast.  Include foods that contain fiber (fruits, vegetables, whole grains, and legumes). Research shows that fiber may lower blood cholesterol levels, especially the water-soluble fiber found in fruits, vegetables, oat products, and legumes. FRUITS AND VEGETABLES It is  good to eat fruits and vegetables. Besides being sources of fiber, both are rich in vitamins and some minerals. They help you get the daily allowances of these nutrients. Fruits and vegetables can be used for snacks and desserts. MEATS Limit lean meat, chicken, [place], and fish to no more than 6 ounces per day. Beef, Pork, and Lamb Use lean cuts of beef, pork, and lamb. Lean cuts include:  Extra-lean ground beef.  Arm roast.  Sirloin tip.  Center-cut ham.  Round steak.  Loin chops.  Rump roast.  Tenderloin.  Trim all fat off the outside of meats before cooking. It is not necessary to severely decrease the intake of red meat, but lean choices should be made. Lean meat is rich in protein and contains a highly absorbable form of iron. Premenopausal women, in  particular, should avoid reducing lean red meat because this could increase the risk for low red blood cells (iron-deficiency anemia). The organ meats, such as liver, sweetbreads, kidneys, and brain are very rich in cholesterol. They should be limited. Chicken and [place] These are good sources of protein. The fat of poultry can be reduced by removing the skin and underlying fat layers before cooking. Chicken and [place] can be substituted for lean red meat in the diet. Poultry should not be fried or covered with high-fat sauces. Fish and Shellfish Fish is a good source of protein. Shellfish contain cholesterol, but they usually are low in saturated fatty acids. The preparation of fish is important. Like chicken and [place], they should not be fried or covered with high-fat sauces. EGGS Egg whites contain no fat or cholesterol. They can be eaten often. Try 1 to 2 egg whites instead of whole eggs in recipes or use egg substitutes that do not contain yolk. MILK AND DAIRY PRODUCTS Use skim or 1% milk instead of 2% or whole milk. Decrease whole milk, natural, and processed cheeses. Use nonfat or low-fat (2%) cottage cheese or low-fat cheeses made from vegetable oils. Choose nonfat or low-fat (1 to 2%) yogurt. Experiment with evaporated skim milk in recipes that call for heavy cream. Substitute low-fat yogurt or low-fat cottage cheese for sour cream in dips and salad dressings. Have at least 2 servings of low-fat dairy products, such as 2 glasses of skim (or 1%) milk each day to help get your daily calcium intake.  FATS AND OILS Reduce the total intake of fats, especially saturated fat. Butterfat, lard, and beef fats are high in saturated fat and cholesterol. These should be avoided as much as possible. Vegetable fats do not contain cholesterol, but certain vegetable fats, such as coconut oil, palm oil, and palm kernel oil are very high in saturated fats. These should be limited. These fats are often used in  bakery goods, processed foods, popcorn, oils, and nondairy creamers. Vegetable shortenings and some peanut butters contain hydrogenated oils, which are also saturated fats. Read the labels on these foods and check for saturated vegetable oils. Unsaturated vegetable oils and fats do not raise blood cholesterol. However, they should be limited because they are fats and are high in calories. Total fat should still be limited to 30% of your daily caloric intake. Desirable liquid vegetable oils are corn oil, cottonseed oil, olive oil, canola oil, safflower oil, soybean oil, and sunflower oil. Peanut oil is not as good, but small amounts are acceptable. Buy a heart-healthy tub margarine that has no partially hydrogenated oils in the ingredients. Mayonnaise and salad dressings often are made from unsaturated fats,  but they should also be limited because of their high calorie and fat content. Seeds, nuts, peanut butter, olives, and avocados are high in fat, but the fat is mainly the unsaturated type. These foods should be limited mainly to avoid excess calories and fat. OTHER EATING TIPS Snacks  Most sweets should be limited as snacks. They tend to be rich in calories and fats, and their caloric content outweighs their nutritional value. Some good choices in snacks are graham crackers, melba toast, soda crackers, bagels (no egg), English muffins, fruits, and vegetables. These snacks are preferable to snack crackers, [place] fries, and chips. Popcorn should be air-popped or cooked in small amounts of liquid vegetable oil. Desserts Eat fruit, low-fat yogurt, and fruit ices. AVOID pastries, cake, and cookies. Sherbet, angel food cake, gelatin dessert, frozen low-fat yogurt, or other frozen products that do not contain saturated fat (pure fruit juice bars, frozen ice pops) are also acceptable.  COOKING METHODS Choose those methods that use little or no fat. They include: Poaching.  Braising.  Steaming.  Grilling.    Baking.  Stir-frying.  Broiling.  Microwaving.  Foods can be cooked in a nonstick pan without added fat, or use a nonfat cooking spray in regular cookware. Limit fried foods and avoid frying in saturated fat. Add moisture to lean meats by using water, broth, cooking wines, and other nonfat or low-fat sauces along with the cooking methods mentioned above. Soups and stews should be chilled after cooking. The fat that forms on top after a few hours in the refrigerator should be skimmed off. When preparing meals, avoid using excess salt. Salt can contribute to raising blood pressure in some people. EATING AWAY FROM HOME Order entres, potatoes, and vegetables without sauces or butter. When meat exceeds the size of a deck of cards (3 to 4 ounces), the rest can be taken home for another meal. Choose vegetable or fruit salads and ask for low-calorie salad dressings to be served on the side. Use dressings sparingly. Limit high-fat toppings, such as bacon, crumbled eggs, cheese, sunflower seeds, and olives. Ask for heart-healthy tub margarine instead of butter.   Diverticulosis Diverticulosis is a common condition that develops when small pouches (diverticula) form in the wall of the colon. The risk of diverticulosis increases with age. It happens more often in people who eat a low-fiber diet. Most individuals with diverticulosis have no symptoms. Those individuals with symptoms usually experience belly (abdominal) pain, constipation, or loose stools (diarrhea).  HOME CARE INSTRUCTIONS  Increase the amount of fiber in your diet as directed by your caregiver or dietician. This may reduce symptoms of diverticulosis.   Drink at least 6 to 8 glasses of water each day to prevent constipation.   Try not to strain when you have a bowel movement.   Avoiding nuts and seeds to prevent complications is still an uncertain benefit.       FOODS HAVING HIGH FIBER CONTENT INCLUDE:  Fruits. Apple, peach, pear,  tangerine, raisins, prunes.   Vegetables. Brussels sprouts, asparagus, broccoli, cabbage, carrot, cauliflower, romaine lettuce, spinach, summer squash, tomato, winter squash, zucchini.   Starchy Vegetables. Baked beans, kidney beans, lima beans, split peas, lentils, potatoes (with skin).   Grains. Whole wheat bread, brown rice, bran flake cereal, plain oatmeal, white rice, shredded wheat, bran muffins.    SEEK IMMEDIATE MEDICAL CARE IF:  You develop increasing pain or severe bloating.   You have an oral temperature above 101F.   You develop vomiting or bowel movements  that are bloody or black.
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
## 2011-11-22 NOTE — Anesthesia Postprocedure Evaluation (Signed)
  Anesthesia Post-op Note  Patient: Brittany Nunez  Procedure(s) Performed: Procedure(s) (LRB): COLONOSCOPY WITH PROPOFOL (N/A) ESOPHAGOGASTRODUODENOSCOPY (EGD) WITH PROPOFOL (N/A)  Patient Location: PACU  Anesthesia Type: MAC  Level of Consciousness: awake, alert , oriented and patient cooperative  Airway and Oxygen Therapy: Patient Spontanous Breathing  Post-op Pain: none  Post-op Assessment: Post-op Vital signs reviewed, Patient's Cardiovascular Status Stable, Respiratory Function Stable, Patent Airway and No signs of Nausea or vomiting  Post-op Vital Signs: Reviewed and stable  Complications: No apparent anesthesia complications

## 2011-11-22 NOTE — Anesthesia Preprocedure Evaluation (Signed)
Anesthesia Evaluation  Patient identified by MRN, date of birth, ID band Patient awake    Reviewed: Allergy & Precautions, H&P , NPO status , Patient's Chart, lab work & pertinent test results  History of Anesthesia Complications Negative for: history of anesthetic complications  Airway Mallampati: I      Dental  (+) Teeth Intact   Pulmonary neg pulmonary ROS,  breath sounds clear to auscultation        Cardiovascular hypertension, Pt. on medications Rhythm:Regular     Neuro/Psych  Headaches, Anxiety    GI/Hepatic   Endo/Other  Diabetes mellitus-, Well Controlled, Type 2, Oral Hypoglycemic Agents  Renal/GU      Musculoskeletal   Abdominal   Peds  Hematology   Anesthesia Other Findings   Reproductive/Obstetrics                           Anesthesia Physical Anesthesia Plan  ASA: II  Anesthesia Plan: MAC   Post-op Pain Management:    Induction: Intravenous  Airway Management Planned: Simple Face Mask  Additional Equipment:   Intra-op Plan:   Post-operative Plan:   Informed Consent: I have reviewed the patients History and Physical, chart, labs and discussed the procedure including the risks, benefits and alternatives for the proposed anesthesia with the patient or authorized representative who has indicated his/her understanding and acceptance.     Plan Discussed with:   Anesthesia Plan Comments:         Anesthesia Quick Evaluation

## 2011-11-22 NOTE — Transfer of Care (Signed)
Immediate Anesthesia Transfer of Care Note  Patient: Brittany Nunez  Procedure(s) Performed: Procedure(s) (LRB): COLONOSCOPY WITH PROPOFOL (N/A) ESOPHAGOGASTRODUODENOSCOPY (EGD) WITH PROPOFOL (N/A)  Patient Location: PACU  Anesthesia Type: MAC  Level of Consciousness: awake, alert , oriented and patient cooperative  Airway & Oxygen Therapy: Patient Spontanous Breathing  Post-op Assessment: Report given to PACU RN and Post -op Vital signs reviewed and stable  Post vital signs: Reviewed and stable  Complications: No apparent anesthesia complications

## 2011-11-22 NOTE — Op Note (Signed)
[facility] [address] [place], [place]  [address]  COLONOSCOPY PROCEDURE REPORT  PATIENT:  Brittany, Nunez  MR#:  [id number] BIRTHDATE:  14-Aug-1960, 51 yrs. old  GENDER:  female  ENDOSCOPIST:  Jonette Eva, MD REF. BY:  [name], M.D. ASSISTANT:  PROCEDURE DATE:  11/22/2011 PROCEDURE:  Colonoscopy 95621  INDICATIONS:  NORMOCYTIC ANEMIA ON MOBIC  DEC 2012-MAY 2013 HB 11.1-11.5, MCV > 80 FERRITIN 25, NL Cr-MAY 2013 HEME NEGx3  MEDICATIONS:   MAC sedation, administered by CRNA  DESCRIPTION OF PROCEDURE:    Physical exam was performed. Informed consent was obtained from the patient after explaining the benefits, risks, and alternatives to procedure.  The patient was connected to monitor and placed in left lateral position. Continuous oxygen was provided by nasal cannula and IV medicine administered through an indwelling cannula.  After administration of sedation and rectal exam, the patient's rectum was intubated and the  colonoscope was advanced under direct visualization to the cecum.  The scope was removed slowly by carefully examining the color, texture, anatomy, and integrity mucosa on the way out. The patient was recovered in endoscopy and discharged home in satisfactory condition. <<PROCEDUREIMAGES>>  FINDINGS:  Scattered diverticula were found throughout the colon. NO POLYPS OR AVMs. NL RECTUM.  PREP QUALITY: GOOD CECAL W/D TIME:    15 minutes  COMPLICATIONS:    None  ENDOSCOPIC IMPRESSION: 1) Diverticula, scattered throughout the colon 2) NL ILEUM 3) NO SOURCE FOR ANEMIA IDENTIFIED  RECOMMENDATIONS: PROCEED TO EGD HIGH FIBER DIET TCS IN 10 YEARS  REPEAT EXAM:  No  ______________________________ Jonette Eva, MD  CC:  [name], M.D.  n. eSIGNED:   Sairah Knobloch at 11/22/2011 03:49 PM  Margarita Mail, [phone]

## 2011-11-26 ENCOUNTER — Telehealth: Payer: Self-pay | Admitting: Gastroenterology

## 2011-11-26 ENCOUNTER — Encounter (HOSPITAL_COMMUNITY): Payer: Self-pay | Admitting: Gastroenterology

## 2011-11-26 NOTE — Telephone Encounter (Signed)
Pt aware of results with no question 

## 2011-11-26 NOTE — Telephone Encounter (Signed)
Please call pt. HER stomach Bx shows mild gastritis.    TAKE OMEPRAZOLE EVERY MORNING. AVOID ITEMS THAT TRIGGER GASTRITIS.   FOLLOW A HIGH FIBER/LOW FAT DIET. AVOID ITEMS THAT CAUSE BLOATING.    FOLLOW UP IN 4 MOS AND WE WILL RECHECK YOUR BLOOD COUNT. Next colonoscopy in 10 years.

## 2011-11-27 NOTE — Telephone Encounter (Signed)
Results Cc to PCP  

## 2011-11-27 NOTE — Telephone Encounter (Signed)
Reminder in epic to follow up in 4 months and TCS in 10 years °

## 2011-11-28 ENCOUNTER — Telehealth: Payer: Self-pay | Admitting: Gastroenterology

## 2011-11-28 ENCOUNTER — Other Ambulatory Visit: Payer: Self-pay | Admitting: Gastroenterology

## 2011-11-28 NOTE — Telephone Encounter (Signed)
Pt called, said Dr Darrick Penna was suppose to call in a Rx for Prevacid to [facility] but it was not there when she went by to pick up -

## 2011-11-28 NOTE — Telephone Encounter (Signed)
Please call patient and possibly pharmacy. She has she called in omeprazole not Prevacid on May 31.

## 2011-11-28 NOTE — Telephone Encounter (Signed)
I called in Rx because [facility] did not go through.

## 2011-12-10 ENCOUNTER — Other Ambulatory Visit: Payer: Self-pay

## 2011-12-10 MED ORDER — ACCU-CHEK FASTCLIX LANCETS MISC: 1.0000 | Freq: Every day | Status: DC

## 2011-12-11 ENCOUNTER — Ambulatory Visit (INDEPENDENT_AMBULATORY_CARE_PROVIDER_SITE_OTHER): Payer: Commercial Managed Care - PPO | Admitting: Family Medicine

## 2011-12-11 ENCOUNTER — Encounter: Payer: Self-pay | Admitting: Family Medicine

## 2011-12-11 VITALS — BP 118/80 | HR 98 | Resp 16 | Ht 66.0 in | Wt 197.4 lb

## 2011-12-11 DIAGNOSIS — L259 Unspecified contact dermatitis, unspecified cause: Secondary | ICD-10-CM | POA: Diagnosis not present

## 2011-12-11 DIAGNOSIS — E119 Type 2 diabetes mellitus without complications: Secondary | ICD-10-CM

## 2011-12-11 DIAGNOSIS — M549 Dorsalgia, unspecified: Secondary | ICD-10-CM | POA: Diagnosis not present

## 2011-12-11 DIAGNOSIS — B029 Zoster without complications: Secondary | ICD-10-CM | POA: Diagnosis not present

## 2011-12-11 DIAGNOSIS — L0292 Furuncle, unspecified: Secondary | ICD-10-CM

## 2011-12-11 DIAGNOSIS — L309 Dermatitis, unspecified: Secondary | ICD-10-CM | POA: Insufficient documentation

## 2011-12-11 DIAGNOSIS — L0293 Carbuncle, unspecified: Secondary | ICD-10-CM

## 2011-12-11 MED ORDER — SAXAGLIPTIN-METFORMIN ER 2.5-1000 MG PO TB24: 2.0000 | ORAL_TABLET | Freq: Every evening | ORAL | Status: DC

## 2011-12-11 MED ORDER — MELOXICAM 15 MG PO TABS: 15.0000 mg | ORAL_TABLET | Freq: Every day | ORAL | Status: DC

## 2011-12-11 MED ORDER — FLUCONAZOLE 150 MG PO TABS: ORAL_TABLET | ORAL | Status: AC

## 2011-12-11 MED ORDER — ACYCLOVIR 800 MG PO TABS: ORAL_TABLET | ORAL | Status: DC

## 2011-12-11 MED ORDER — BACLOFEN 20 MG PO TABS: 20.0000 mg | ORAL_TABLET | Freq: Two times a day (BID) | ORAL | Status: DC

## 2011-12-11 MED ORDER — CEPHALEXIN 500 MG PO CAPS: 500.0000 mg | ORAL_CAPSULE | Freq: Four times a day (QID) | ORAL | Status: AC

## 2011-12-11 NOTE — Assessment & Plan Note (Signed)
Inadequate response to max dose mobic, try celebrex

## 2011-12-11 NOTE — Progress Notes (Signed)
  Subjective:    Patient ID: Brittany Nunez, female    DOB: 1961-05-08, 51 y.o.   MRN: [id number]  HPI 1 week h/o red rash on left breast in cluster, then yesterday had similar outbreak on both ears and also left jaw. No fever or chills, no other family member is affected.  requsts change in anti inflammatory as has been doubling up on . Blood sugars are well controlled when checked, fasting sugar is seldom over 120    Review of Systems See HPI Denies recent fever or chills. Denies sinus pressure, nasal congestion, ear pain or sore throat. Denies chest congestion, productive cough or wheezing. Denies chest pains, palpitations and leg swelling Denies abdominal pain, nausea, vomiting,diarrhea or constipation.   Denies dysuria, frequency, hesitancy or incontinence.  Denies headaches, seizures, numbness, or tingling. Denies depression, anxiety or insomnia.         Objective:   Physical Exam  Patient alert and oriented and in no cardiopulmonary distress.  HEENT: No facial asymmetry, EOMI, no sinus tenderness,  oropharynx pink and moist.  Neck supple no adenopathy.  Chest: Clear to auscultation bilaterally.  CVS: S1, S2 no murmurs, no S3.  ABD: Soft non tender. Bowel sounds normal.  Ext: No edema  WU:[id number]  ROM spine, shoulders, hips and knees.  Skin: vesicular rash on left breast , also cellulitis with evidence of bacterial superinfection.  Psych: Good eye contact, normal affect. Memory intact not anxious or depressed appearing.  CNS: CN 2-12 intact, power, tone and sensation normal throughout.       Assessment & Plan:

## 2011-12-11 NOTE — Patient Instructions (Addendum)
F/u as before.  Medication sent in for shingles and also skin infection as well as yeast infection in case you develop this  You have to continue meloxicam one daily for back pain, you may want to consider consultation with our local pain specialist , I feel this will be benficial

## 2012-01-16 NOTE — Assessment & Plan Note (Signed)
bnacterial superinfection of viral outbreak, antibiotic prescribed, and good hand hygiene stressed

## 2012-01-16 NOTE — Assessment & Plan Note (Signed)
antibiotic course prescribed 

## 2012-01-16 NOTE — Assessment & Plan Note (Signed)
Acute outbreak, acyclovir prescribed 

## 2012-01-16 NOTE — Assessment & Plan Note (Signed)
Controlled, no change in medication Updated labs prior to next visit

## 2012-01-25 ENCOUNTER — Telehealth: Payer: Self-pay | Admitting: Family Medicine

## 2012-01-25 ENCOUNTER — Other Ambulatory Visit: Payer: Self-pay

## 2012-01-25 MED ORDER — TEMAZEPAM 30 MG PO CAPS: 30.0000 mg | ORAL_CAPSULE | Freq: Every day | ORAL | Status: DC

## 2012-01-25 NOTE — Telephone Encounter (Signed)
Med refilled.

## 2012-02-04 ENCOUNTER — Other Ambulatory Visit (HOSPITAL_COMMUNITY)
Admission: RE | Admit: 2012-02-04 | Discharge: 2012-02-04 | Disposition: A | Discharge status: Home / Self Care | Payer: Commercial Managed Care - PPO | Source: Ambulatory Visit | Attending: Family Medicine | Admitting: Family Medicine

## 2012-02-04 ENCOUNTER — Encounter: Payer: Self-pay | Admitting: Family Medicine

## 2012-02-04 ENCOUNTER — Ambulatory Visit (INDEPENDENT_AMBULATORY_CARE_PROVIDER_SITE_OTHER): Payer: Commercial Managed Care - PPO | Admitting: Family Medicine

## 2012-02-04 VITALS — BP 122/84 | HR 84 | Resp 16 | Ht 66.0 in | Wt 195.4 lb

## 2012-02-04 DIAGNOSIS — E119 Type 2 diabetes mellitus without complications: Secondary | ICD-10-CM

## 2012-02-04 DIAGNOSIS — Z01419 Encounter for gynecological examination (general) (routine) without abnormal findings: Secondary | ICD-10-CM | POA: Insufficient documentation

## 2012-02-04 DIAGNOSIS — D649 Anemia, unspecified: Secondary | ICD-10-CM

## 2012-02-04 DIAGNOSIS — Z0000 Encounter for general adult medical examination without abnormal findings: Secondary | ICD-10-CM

## 2012-02-04 DIAGNOSIS — R5381 Other malaise: Secondary | ICD-10-CM

## 2012-02-04 DIAGNOSIS — E669 Obesity, unspecified: Secondary | ICD-10-CM

## 2012-02-04 DIAGNOSIS — Z1211 Encounter for screening for malignant neoplasm of colon: Secondary | ICD-10-CM

## 2012-02-04 DIAGNOSIS — Z1272 Encounter for screening for malignant neoplasm of vagina: Secondary | ICD-10-CM

## 2012-02-04 LAB — BASIC METABOLIC PANEL
BUN: 9 mg/dL (ref 6–23)
CO2: 30 mEq/L (ref 19–32)
Chloride: 105 mEq/L (ref 96–112)
Creat: 0.88 mg/dL (ref 0.50–1.10)
Glucose, Bld: 73 mg/dL (ref 70–99)

## 2012-02-04 LAB — HEMOGLOBIN A1C: Hgb A1c MFr Bld: 6.1 % — ABNORMAL HIGH (ref <5.7)

## 2012-02-04 NOTE — Assessment & Plan Note (Signed)
Pelvic and breast exam completed and documented at this visit. No abnormality noted. General health issues also discussed to improve  health and wellbeing

## 2012-02-04 NOTE — Patient Instructions (Addendum)
Annual wellness December 17 or after.Call if you need me before  cALL IN oCTOBER FOR YOUR FLU VACCINE PLEASE  hBA1C and chem 7 today    Fasting lipid, cmp and EGFR, HBA1C , TSH , CBC December 13 or after  It is important that you exercise regularly at least 30 minutes 5 times a week. If you develop chest pain, have severe difficulty breathing, or feel very tired, stop exercising immediately and seek medical attention   A healthy diet is rich in fruit, vegetables and whole grains. Poultry fish, nuts and beans are a healthy choice for protein rather then red meat. A low sodium diet and drinking 64 ounces of water daily is generally recommended. Oils and sweet should be limited. Carbohydrates especially for those who are diabetic or overweight, should be limited to 30-45 gram per meal. It is important to eat on a regular schedule, at least 3 times daily. Snacks should be primarily fruits, vegetables or nuts.  Keep up the good work, you have lost 10 pounds in the past 4 months!  Please schedule your eye exam and start flossing DAILY
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
## 2012-02-07 ENCOUNTER — Other Ambulatory Visit: Payer: Self-pay | Admitting: Family Medicine

## 2012-03-05 ENCOUNTER — Encounter: Payer: Self-pay | Admitting: Gastroenterology

## 2012-03-27 ENCOUNTER — Other Ambulatory Visit: Payer: Self-pay | Admitting: Family Medicine

## 2012-04-11 ENCOUNTER — Encounter: Payer: Self-pay | Admitting: Family Medicine

## 2012-04-11 ENCOUNTER — Telehealth: Payer: Self-pay | Admitting: Family Medicine

## 2012-04-11 ENCOUNTER — Ambulatory Visit (INDEPENDENT_AMBULATORY_CARE_PROVIDER_SITE_OTHER): Payer: Medicare Other | Admitting: Family Medicine

## 2012-04-11 ENCOUNTER — Ambulatory Visit: Payer: Commercial Managed Care - PPO | Admitting: Family Medicine

## 2012-04-11 VITALS — BP 122/90 | HR 88 | Resp 15 | Ht 66.0 in | Wt 199.8 lb

## 2012-04-11 DIAGNOSIS — M549 Dorsalgia, unspecified: Secondary | ICD-10-CM | POA: Diagnosis not present

## 2012-04-11 MED ORDER — METHYLPREDNISOLONE ACETATE 40 MG/ML IJ SUSP
40.0000 mg | Freq: Once | INTRAMUSCULAR | Status: AC
  Administered 2012-04-11: 40 mg via INTRAMUSCULAR

## 2012-04-11 MED ORDER — METHYLPREDNISOLONE (PAK) 4 MG PO TABS: ORAL_TABLET | ORAL | Status: DC

## 2012-04-11 MED ORDER — HYDROCODONE-ACETAMINOPHEN 5-500 MG PO TABS: 1.0000 | ORAL_TABLET | ORAL | Status: DC | PRN

## 2012-04-11 NOTE — Telephone Encounter (Signed)
Will schedule with Dr Jeanice Lim today

## 2012-04-11 NOTE — Assessment & Plan Note (Signed)
Worsened back pain with some radicular symptoms, acute injury, Depo medrol given, follow with medrol dosepak, hold mobic, add vicodin, continue other medications She has had flares on and off, last MRI 2007, if no improvement would obtain MRI L spine

## 2012-04-11 NOTE — Patient Instructions (Signed)
Steroid shot given Start dosepak tomorrow Vicodin for pain Do not take Meloxicam until you finish steroid dosepak Call if you do not improve  Schedule for your flu shot  Keep previous f/u appt

## 2012-04-11 NOTE — Progress Notes (Signed)
  Subjective:    Patient ID: Brittany Nunez, female    DOB: 26-Apr-1961, 51 y.o.   MRN: [id number]  HPI  Back pain x 4 days, history of chronic back pain on mobic, baclofen and gabapentin, was picking up grandbaby and since then has had severe pain more than usual in her back, pain with walking, standing or sitting too long, has pain radiating the front of both thighs, Denies numbness in feet, change in bowel or bladder    Review of Systems  GEN- denies fatigue, fever, weight loss,weakness, recent illness CVS- denies chest pain, palpitations RESP- denies SOB, cough, wheeze ABD- denies N/V, change in stools, abd pain GU- denies dysuria, hematuria, dribbling, incontinence MSK- +joint pain, muscle aches, injury Neuro- denies headache, dizziness, syncope, seizure activity      Objective:   Physical Exam  GEN-uncomfortable appearing, NAD, alert and oriented x 3 Spine- TTP lumbar spine, + SLR left side, negative on Right, decreased ROM Hip- normal ROM Neuro- sensation grossly in tact, strength equal bilat lower ext, antalgic gait , normal tone      Assessment & Plan:

## 2012-04-21 ENCOUNTER — Ambulatory Visit (INDEPENDENT_AMBULATORY_CARE_PROVIDER_SITE_OTHER): Payer: Medicare Other | Admitting: Family Medicine

## 2012-04-21 ENCOUNTER — Encounter: Payer: Self-pay | Admitting: Family Medicine

## 2012-04-21 VITALS — BP 120/88 | HR 99 | Resp 16 | Ht 66.0 in | Wt 196.4 lb

## 2012-04-21 DIAGNOSIS — Z23 Encounter for immunization: Secondary | ICD-10-CM | POA: Diagnosis not present

## 2012-04-21 DIAGNOSIS — M5136 Other intervertebral disc degeneration, lumbar region: Secondary | ICD-10-CM | POA: Insufficient documentation

## 2012-04-21 DIAGNOSIS — M5126 Other intervertebral disc displacement, lumbar region: Secondary | ICD-10-CM

## 2012-04-21 DIAGNOSIS — M549 Dorsalgia, unspecified: Secondary | ICD-10-CM

## 2012-04-21 MED ORDER — OXYCODONE-ACETAMINOPHEN 5-325 MG PO TABS: 1.0000 | ORAL_TABLET | ORAL | Status: DC | PRN

## 2012-04-21 MED ORDER — METAXALONE 800 MG PO TABS: 800.0000 mg | ORAL_TABLET | Freq: Three times a day (TID) | ORAL | Status: DC

## 2012-04-21 NOTE — Progress Notes (Signed)
  Subjective:    Patient ID: Brittany Nunez, female    DOB: [birth year], 51 y.o.   MRN: [id number]  HPI Patient presents with radicular symptoms on the left side. She states that her back improved some however she has sharp shooting pains from her lower back down her left buttocks to her leg. She now has numbness on and off in her feet. She has a history of numbness or radicular symptoms on the right side however the left is new. She denies any change in bowel or bladder. She states that the pain medication did not help very much. She's also been having spasms and cramps in her legs. 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, with impingment at S1   Review of Systems  GEN- denies fatigue, fever, weight loss,weakness, recent illness HEENT- denies eye drainage, change in vision, nasal discharge, CVS- denies chest pain, palpitations RESP- denies SOB, cough, wheeze ABD- denies N/V, change in stools, abd pain GU- denies dysuria, hematuria, dribbling, incontinence MSK- + joint pain, muscle aches, injury       Objective:   Physical Exam GEN- NAD, alert and oriented x 3 Spine- TTP lumbar spine, + SLR left side, negative on Right, decreased ROM back  Hip- normal ROM Neuro- gross sensation in tact, normal propioception of toes, decreased sensation bilat with monofilament on soles of feet, strength equal bilat lower ext, antalgic gait , normal tone       Assessment & Plan:

## 2012-04-21 NOTE — Assessment & Plan Note (Signed)
She is worsening back pain with new radicular symptoms on the left side. I will change her pain medication to Percocet. MRI will be obtained. Muscle relaxants will be changed to Skelaxin which has helped in the past. She will stop the baclofen. We will continue the gabapentin for neuropathy.

## 2012-04-21 NOTE — Patient Instructions (Signed)
Stop Baclofen Start Skelaxin New pain medication percocet  MRI to be done of back

## 2012-04-24 ENCOUNTER — Other Ambulatory Visit: Payer: Self-pay | Admitting: Family Medicine

## 2012-04-24 DIAGNOSIS — M549 Dorsalgia, unspecified: Secondary | ICD-10-CM

## 2012-04-24 NOTE — Progress Notes (Signed)
noted 

## 2012-04-24 NOTE — Progress Notes (Signed)
I spoke with insurance company- given all history on pt and recent changes, they will not override request for MRI, as pt needs PT within the last 2 months, they state this is a chronic problem with acute flare and does not qualify unless PT done for 4 weeks

## 2012-04-25 ENCOUNTER — Ambulatory Visit (HOSPITAL_COMMUNITY): Payer: BC Managed Care – PPO

## 2012-04-29 ENCOUNTER — Ambulatory Visit (HOSPITAL_COMMUNITY): Payer: BC Managed Care – PPO | Admitting: Physical Therapy

## 2012-04-30 ENCOUNTER — Other Ambulatory Visit: Payer: Self-pay

## 2012-04-30 MED ORDER — TEMAZEPAM 30 MG PO CAPS: 30.0000 mg | ORAL_CAPSULE | Freq: Every day | ORAL | Status: DC

## 2012-05-01 ENCOUNTER — Ambulatory Visit (HOSPITAL_COMMUNITY)
Admission: RE | Admit: 2012-05-01 | Discharge: 2012-05-01 | Disposition: A | Discharge status: Home / Self Care | Payer: BC Managed Care – PPO | Source: Ambulatory Visit | Attending: Family Medicine | Admitting: Family Medicine

## 2012-05-01 DIAGNOSIS — IMO0001 Reserved for inherently not codable concepts without codable children: Secondary | ICD-10-CM | POA: Diagnosis not present

## 2012-05-01 DIAGNOSIS — R29898 Other symptoms and signs involving the musculoskeletal system: Secondary | ICD-10-CM | POA: Insufficient documentation

## 2012-05-01 DIAGNOSIS — M6281 Muscle weakness (generalized): Secondary | ICD-10-CM | POA: Insufficient documentation

## 2012-05-01 DIAGNOSIS — R262 Difficulty in walking, not elsewhere classified: Secondary | ICD-10-CM | POA: Insufficient documentation

## 2012-05-01 DIAGNOSIS — M79609 Pain in unspecified limb: Secondary | ICD-10-CM | POA: Insufficient documentation

## 2012-05-01 DIAGNOSIS — E119 Type 2 diabetes mellitus without complications: Secondary | ICD-10-CM | POA: Insufficient documentation
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
## 2012-05-06 ENCOUNTER — Ambulatory Visit (HOSPITAL_COMMUNITY)
Admission: RE | Admit: 2012-05-06 | Discharge: 2012-05-06 | Disposition: A | Discharge status: Home / Self Care | Payer: BC Managed Care – PPO | Source: Ambulatory Visit | Attending: Family Medicine | Admitting: Family Medicine

## 2012-05-06 DIAGNOSIS — E119 Type 2 diabetes mellitus without complications: Secondary | ICD-10-CM | POA: Diagnosis not present

## 2012-05-06 DIAGNOSIS — IMO0001 Reserved for inherently not codable concepts without codable children: Secondary | ICD-10-CM | POA: Diagnosis not present

## 2012-05-06 DIAGNOSIS — M79609 Pain in unspecified limb: Secondary | ICD-10-CM | POA: Diagnosis not present

## 2012-05-06 DIAGNOSIS — M6281 Muscle weakness (generalized): Secondary | ICD-10-CM | POA: Diagnosis not present
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
## 2012-05-08 ENCOUNTER — Ambulatory Visit (HOSPITAL_COMMUNITY)
Admission: RE | Admit: 2012-05-08 | Discharge: 2012-05-08 | Disposition: A | Discharge status: Home / Self Care | Payer: BC Managed Care – PPO | Source: Ambulatory Visit | Attending: Family Medicine | Admitting: Family Medicine

## 2012-05-08 DIAGNOSIS — M6281 Muscle weakness (generalized): Secondary | ICD-10-CM | POA: Diagnosis not present

## 2012-05-08 DIAGNOSIS — E119 Type 2 diabetes mellitus without complications: Secondary | ICD-10-CM | POA: Diagnosis not present

## 2012-05-08 DIAGNOSIS — M79609 Pain in unspecified limb: Secondary | ICD-10-CM | POA: Diagnosis not present

## 2012-05-08 DIAGNOSIS — IMO0001 Reserved for inherently not codable concepts without codable children: Secondary | ICD-10-CM | POA: Diagnosis not present
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
## 2012-05-13 ENCOUNTER — Ambulatory Visit (HOSPITAL_COMMUNITY)
Admission: RE | Admit: 2012-05-13 | Discharge: 2012-05-13 | Disposition: A | Discharge status: Home / Self Care | Payer: BC Managed Care – PPO | Source: Ambulatory Visit | Attending: Family Medicine | Admitting: Family Medicine

## 2012-05-13 DIAGNOSIS — M79609 Pain in unspecified limb: Secondary | ICD-10-CM | POA: Diagnosis not present

## 2012-05-13 DIAGNOSIS — M6281 Muscle weakness (generalized): Secondary | ICD-10-CM | POA: Diagnosis not present

## 2012-05-13 DIAGNOSIS — R29898 Other symptoms and signs involving the musculoskeletal system: Secondary | ICD-10-CM

## 2012-05-13 DIAGNOSIS — E119 Type 2 diabetes mellitus without complications: Secondary | ICD-10-CM | POA: Diagnosis not present

## 2012-05-13 DIAGNOSIS — IMO0001 Reserved for inherently not codable concepts without codable children: Secondary | ICD-10-CM | POA: Diagnosis not present

## 2012-05-13 DIAGNOSIS — R262 Difficulty in walking, not elsewhere classified: Secondary | ICD-10-CM
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
## 2012-05-15 ENCOUNTER — Ambulatory Visit (HOSPITAL_COMMUNITY)
Admission: RE | Admit: 2012-05-15 | Discharge: 2012-05-15 | Disposition: A | Discharge status: Home / Self Care | Payer: BC Managed Care – PPO | Source: Ambulatory Visit | Attending: Family Medicine | Admitting: Family Medicine

## 2012-05-15 DIAGNOSIS — IMO0001 Reserved for inherently not codable concepts without codable children: Secondary | ICD-10-CM | POA: Diagnosis not present

## 2012-05-15 DIAGNOSIS — M79609 Pain in unspecified limb: Secondary | ICD-10-CM | POA: Diagnosis not present

## 2012-05-15 DIAGNOSIS — E119 Type 2 diabetes mellitus without complications: Secondary | ICD-10-CM | POA: Diagnosis not present

## 2012-05-15 DIAGNOSIS — M6281 Muscle weakness (generalized): Secondary | ICD-10-CM | POA: Diagnosis not present

## 2012-05-15 DIAGNOSIS — R262 Difficulty in walking, not elsewhere classified: Secondary | ICD-10-CM

## 2012-05-15 DIAGNOSIS — R29898 Other symptoms and signs involving the musculoskeletal system: Secondary | ICD-10-CM
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
## 2012-05-17 ENCOUNTER — Other Ambulatory Visit: Payer: Self-pay | Admitting: Family Medicine

## 2012-05-20 ENCOUNTER — Ambulatory Visit (HOSPITAL_COMMUNITY)
Admission: RE | Admit: 2012-05-20 | Discharge: 2012-05-20 | Disposition: A | Discharge status: Home / Self Care | Payer: BC Managed Care – PPO | Source: Ambulatory Visit | Attending: Family Medicine | Admitting: Family Medicine

## 2012-05-20 DIAGNOSIS — M6281 Muscle weakness (generalized): Secondary | ICD-10-CM | POA: Diagnosis not present

## 2012-05-20 DIAGNOSIS — IMO0001 Reserved for inherently not codable concepts without codable children: Secondary | ICD-10-CM | POA: Diagnosis not present

## 2012-05-20 DIAGNOSIS — R29898 Other symptoms and signs involving the musculoskeletal system: Secondary | ICD-10-CM

## 2012-05-20 DIAGNOSIS — R262 Difficulty in walking, not elsewhere classified: Secondary | ICD-10-CM

## 2012-05-20 DIAGNOSIS — M79609 Pain in unspecified limb: Secondary | ICD-10-CM | POA: Diagnosis not present

## 2012-05-20 DIAGNOSIS — E119 Type 2 diabetes mellitus without complications: Secondary | ICD-10-CM | POA: Diagnosis not present
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
## 2012-05-27 ENCOUNTER — Ambulatory Visit (HOSPITAL_COMMUNITY)
Admission: RE | Admit: 2012-05-27 | Discharge: 2012-05-27 | Disposition: A | Discharge status: Home / Self Care | Payer: BC Managed Care – PPO | Source: Ambulatory Visit | Attending: Family Medicine | Admitting: Family Medicine

## 2012-05-27 DIAGNOSIS — E119 Type 2 diabetes mellitus without complications: Secondary | ICD-10-CM | POA: Insufficient documentation

## 2012-05-27 DIAGNOSIS — R29898 Other symptoms and signs involving the musculoskeletal system: Secondary | ICD-10-CM

## 2012-05-27 DIAGNOSIS — M79609 Pain in unspecified limb: Secondary | ICD-10-CM | POA: Insufficient documentation

## 2012-05-27 DIAGNOSIS — IMO0001 Reserved for inherently not codable concepts without codable children: Secondary | ICD-10-CM | POA: Insufficient documentation

## 2012-05-27 DIAGNOSIS — R262 Difficulty in walking, not elsewhere classified: Secondary | ICD-10-CM

## 2012-05-27 DIAGNOSIS — M6281 Muscle weakness (generalized): Secondary | ICD-10-CM | POA: Insufficient documentation
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
## 2012-05-28 ENCOUNTER — Other Ambulatory Visit: Payer: Self-pay

## 2012-05-28 MED ORDER — SAXAGLIPTIN-METFORMIN ER 2.5-1000 MG PO TB24: 2.0000 | ORAL_TABLET | Freq: Every evening | ORAL | Status: DC

## 2012-05-29 ENCOUNTER — Ambulatory Visit (HOSPITAL_COMMUNITY)
Admission: RE | Admit: 2012-05-29 | Discharge: 2012-05-29 | Disposition: A | Discharge status: Home / Self Care | Payer: BC Managed Care – PPO | Source: Ambulatory Visit | Attending: Family Medicine | Admitting: Family Medicine

## 2012-05-29 DIAGNOSIS — R29898 Other symptoms and signs involving the musculoskeletal system: Secondary | ICD-10-CM

## 2012-05-29 DIAGNOSIS — R262 Difficulty in walking, not elsewhere classified: Secondary | ICD-10-CM
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
## 2012-06-01 NOTE — Evaluation (Signed)
Agree with above treatment plan

## 2012-06-03 ENCOUNTER — Ambulatory Visit (HOSPITAL_COMMUNITY)
Admission: RE | Admit: 2012-06-03 | Discharge: 2012-06-03 | Disposition: A | Discharge status: Home / Self Care | Payer: BC Managed Care – PPO | Source: Ambulatory Visit | Attending: Family Medicine | Admitting: Family Medicine

## 2012-06-03 DIAGNOSIS — R29898 Other symptoms and signs involving the musculoskeletal system: Secondary | ICD-10-CM

## 2012-06-03 DIAGNOSIS — R262 Difficulty in walking, not elsewhere classified: Secondary | ICD-10-CM
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
## 2012-06-05 ENCOUNTER — Ambulatory Visit (HOSPITAL_COMMUNITY)
Admission: RE | Admit: 2012-06-05 | Discharge: 2012-06-05 | Disposition: A | Discharge status: Home / Self Care | Payer: BC Managed Care – PPO | Source: Ambulatory Visit | Attending: Family Medicine | Admitting: Family Medicine
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
## 2012-06-09 ENCOUNTER — Encounter: Payer: Self-pay | Admitting: Family Medicine

## 2012-06-09 ENCOUNTER — Ambulatory Visit (INDEPENDENT_AMBULATORY_CARE_PROVIDER_SITE_OTHER): Payer: BC Managed Care – PPO | Admitting: Family Medicine

## 2012-06-09 VITALS — BP 120/70 | HR 100 | Resp 16 | Ht 66.0 in | Wt 206.1 lb

## 2012-06-09 DIAGNOSIS — N951 Menopausal and female climacteric states: Secondary | ICD-10-CM | POA: Diagnosis not present

## 2012-06-09 DIAGNOSIS — E669 Obesity, unspecified: Secondary | ICD-10-CM

## 2012-06-09 DIAGNOSIS — E785 Hyperlipidemia, unspecified: Secondary | ICD-10-CM

## 2012-06-09 DIAGNOSIS — E782 Mixed hyperlipidemia: Secondary | ICD-10-CM | POA: Insufficient documentation

## 2012-06-09 DIAGNOSIS — E119 Type 2 diabetes mellitus without complications: Secondary | ICD-10-CM | POA: Diagnosis not present

## 2012-06-09 DIAGNOSIS — G4700 Insomnia, unspecified: Secondary | ICD-10-CM

## 2012-06-09 DIAGNOSIS — R232 Flushing: Secondary | ICD-10-CM

## 2012-06-09 DIAGNOSIS — M549 Dorsalgia, unspecified: Secondary | ICD-10-CM

## 2012-06-09 MED ORDER — MELOXICAM 15 MG PO TABS: ORAL_TABLET | ORAL | Status: DC

## 2012-06-09 MED ORDER — FLUOXETINE HCL 10 MG PO CAPS: 10.0000 mg | ORAL_CAPSULE | Freq: Every day | ORAL | Status: DC

## 2012-06-09 MED ORDER — GABAPENTIN 800 MG PO TABS: ORAL_TABLET | ORAL | Status: DC

## 2012-06-09 MED ORDER — BACLOFEN 20 MG PO TABS: 20.0000 mg | ORAL_TABLET | Freq: Two times a day (BID) | ORAL | Status: DC

## 2012-06-09 NOTE — Assessment & Plan Note (Signed)
Deteriorated. Patient re-educated about  the importance of commitment to a  minimum of 150 minutes of exercise per week. The importance of healthy food choices with portion control discussed. Encouraged to start a food diary, count calories and to consider  joining a support group. Sample diet sheets offered. Goals set by the patient for the next several months.    

## 2012-06-09 NOTE — Assessment & Plan Note (Signed)
Improving in week 4 of 6 as far as PT is concerned. Pt to complete course, sh reports over 60% improvement. Pain started after she attempted to lift her 52 y/o grandchild

## 2012-06-09 NOTE — Assessment & Plan Note (Signed)
intolerant of effexor which helped, will try fluoxetine

## 2012-06-09 NOTE — Progress Notes (Signed)
  Subjective:    Patient ID: Brittany Nunez, female    DOB: [birth year], 51 y.o.   MRN: [id number]  HPI The PT is here for follow up and re-evaluation of chronic medical conditions, medication management and review of any available recent lab and radiology data.  Preventive health is updated, specifically  Cancer screening and Immunization.   Questions or concerns regarding consultations or procedures which the PT has had in the interim are  addressed. Intolerant of med prescribed for hot flashes, and very symptomatic, will try fluoxetine There are no new concerns. Reports improved back pain with therapy There are no specific complaints  Tests blood sugars 2 to 3 times per week, values are always within the normal range      Review of Systems See HPI Denies recent fever or chills. Denies sinus pressure, nasal congestion, ear pain or sore throat. Denies chest congestion, productive cough or wheezing. Denies chest pains, palpitations and leg swelling Denies abdominal pain, nausea, vomiting,diarrhea or constipation.   Denies dysuria, frequency, hesitancy or incontinence.  Denies headaches, seizures, numbness, or tingling. Denies depression, anxiety or insomnia. Denies skin break down or rash.        Objective:   Physical Exam  Patient alert and oriented and in no cardiopulmonary distress.  HEENT: No facial asymmetry, EOMI, no sinus tenderness,  oropharynx pink and moist.  Neck supple no adenopathy.  Chest: Clear to auscultation bilaterally.  CVS: S1, S2 no murmurs, no S3.  ABD: Soft non tender. Bowel sounds normal.  Ext: No edema  MS: Adequate though reduced  ROM spine,adequate in  shoulders, hips and knees.  Skin: Intact, no ulcerations or rash noted.  Psych: Good eye contact, normal affect. Memory intact not anxious or depressed appearing.  CNS: CN 2-12 intact, power, tone and sensation normal throughout.       Assessment & Plan:

## 2012-06-09 NOTE — Assessment & Plan Note (Signed)
Sleep hygiene reviewed, continue med

## 2012-06-09 NOTE — Assessment & Plan Note (Signed)
Updated lab past due. Well controlled by history, Needs to sched eye exam which is past due Patient advised to reduce carb and sweets, commit to regular physical activity, take meds as prescribed, test blood as directed, and attempt to lose weight, to improve blood sugar control.

## 2012-06-09 NOTE — Patient Instructions (Addendum)
F/u in 4 month, call if you need me before  I am happy to know that the back pain is much less  Please get labs as soon as possible  You will get info. To look at your labs and radiology reports from your home computer, please log in   New medication fluoxetine one daily for hot flashes, since you did not tolerate the other   Microalb today   hBA1C chem 7 and EGFR in 4 month, non fasting  It is important that you exercise regularly at least 30 minutes 5 times a week. If you develop chest pain, have severe difficulty breathing, or feel very tired, stop exercising immediately and seek medical attention    A healthy diet is rich in fruit, vegetables and whole grains. Poultry fish, nuts and beans are a healthy choice for protein rather then red meat. A low sodium diet and drinking 64 ounces of water daily is generally recommended. Oils and sweet should be limited. Carbohydrates especially for those who are diabetic or overweight, should be limited to 30-45 gram per meal. It is important to eat on a regular schedule, at least 3 times daily. Snacks should be primarily fruits, vegetables or nuts.  Weight loss goal of 6 to 8 pounds  Check with your insurance to see if zostavax is covered, we would be happy to give it and OK to take from age 51

## 2012-06-10 ENCOUNTER — Other Ambulatory Visit: Payer: Self-pay | Admitting: Family Medicine

## 2012-06-10 ENCOUNTER — Ambulatory Visit (HOSPITAL_COMMUNITY)
Admission: RE | Admit: 2012-06-10 | Discharge: 2012-06-10 | Disposition: A | Discharge status: Home / Self Care | Payer: BC Managed Care – PPO | Source: Ambulatory Visit | Attending: Family Medicine | Admitting: Family Medicine

## 2012-06-10 DIAGNOSIS — E669 Obesity, unspecified: Secondary | ICD-10-CM | POA: Diagnosis not present

## 2012-06-10 DIAGNOSIS — R5383 Other fatigue: Secondary | ICD-10-CM | POA: Diagnosis not present

## 2012-06-10 DIAGNOSIS — E119 Type 2 diabetes mellitus without complications: Secondary | ICD-10-CM | POA: Diagnosis not present

## 2012-06-10 DIAGNOSIS — D649 Anemia, unspecified: Secondary | ICD-10-CM | POA: Diagnosis not present
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
## 2012-06-11 LAB — COMPLETE METABOLIC PANEL WITH GFR
Albumin: 4.1 g/dL (ref 3.5–5.2)
BUN: 13 mg/dL (ref 6–23)
Calcium: 8.7 mg/dL (ref 8.4–10.5)
Chloride: 106 mEq/L (ref 96–112)
GFR, Est Non African American: 71 mL/min
Glucose, Bld: 76 mg/dL (ref 70–99)
Potassium: 4.9 mEq/L (ref 3.5–5.3)

## 2012-06-11 LAB — CBC WITH DIFFERENTIAL/PLATELET
Basophils Absolute: 0 10*3/uL (ref 0.0–0.1)
Basophils Relative: 1 % (ref 0–1)
Eosinophils Relative: 2 % (ref 0–5)
Lymphocytes Relative: 24 % (ref 12–46)
MCHC: 32.7 g/dL (ref 30.0–36.0)
MCV: 79.6 fL (ref 78.0–100.0)
Neutro Abs: 4.7 10*3/uL (ref 1.7–7.7)
Platelets: 366 10*3/uL (ref 150–400)
RDW: 15.4 % (ref 11.5–15.5)
WBC: 7 10*3/uL (ref 4.0–10.5)

## 2012-06-11 LAB — LIPID PANEL
Cholesterol: 172 mg/dL (ref 0–200)
Triglycerides: 90 mg/dL (ref <150)

## 2012-06-11 LAB — HEMOGLOBIN A1C
Hgb A1c MFr Bld: 6.2 % — ABNORMAL HIGH (ref <5.7)
Mean Plasma Glucose: 131 mg/dL — ABNORMAL HIGH (ref <117)

## 2012-06-12 ENCOUNTER — Ambulatory Visit (HOSPITAL_COMMUNITY)
Admission: RE | Admit: 2012-06-12 | Discharge: 2012-06-12 | Disposition: A | Discharge status: Home / Self Care | Payer: BC Managed Care – PPO | Source: Ambulatory Visit | Attending: Family Medicine | Admitting: Family Medicine

## 2012-06-12 DIAGNOSIS — R29898 Other symptoms and signs involving the musculoskeletal system: Secondary | ICD-10-CM

## 2012-06-12 DIAGNOSIS — R262 Difficulty in walking, not elsewhere classified: Secondary | ICD-10-CM
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
## 2012-06-12 NOTE — Evaluation (Signed)
Reviewed, agree with plan

## 2012-06-23 ENCOUNTER — Ambulatory Visit (INDEPENDENT_AMBULATORY_CARE_PROVIDER_SITE_OTHER): Payer: BC Managed Care – PPO | Admitting: Family Medicine

## 2012-06-23 ENCOUNTER — Encounter: Payer: Self-pay | Admitting: Family Medicine

## 2012-06-23 VITALS — BP 120/80 | HR 101 | Resp 16 | Ht 66.0 in | Wt 204.0 lb

## 2012-06-23 DIAGNOSIS — M549 Dorsalgia, unspecified: Secondary | ICD-10-CM

## 2012-06-23 DIAGNOSIS — N3090 Cystitis, unspecified without hematuria: Secondary | ICD-10-CM

## 2012-06-23 LAB — POCT URINALYSIS DIPSTICK
Bilirubin, UA: NEGATIVE
Glucose, UA: NEGATIVE
Nitrite, UA: NEGATIVE
Urobilinogen, UA: 0.2

## 2012-06-23 MED ORDER — LEVOFLOXACIN 500 MG PO TABS: 500.0000 mg | ORAL_TABLET | Freq: Every day | ORAL | Status: DC
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
## 2012-06-23 NOTE — Patient Instructions (Addendum)
F/u as before.  You have a urinary tract infection, med is sent to your pharmacy.  We will call if results of culture show that a different med is needed   Handicap sticker today
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
## 2012-06-25 NOTE — Assessment & Plan Note (Signed)
Acute infection, antibiotic prescribed, counseled re the need to increase water intake and void often

## 2012-06-27 LAB — URINE CULTURE: Colony Count: >=100000

## 2012-07-10 ENCOUNTER — Other Ambulatory Visit: Payer: Self-pay | Admitting: Family Medicine

## 2012-08-01 ENCOUNTER — Other Ambulatory Visit: Payer: Self-pay | Admitting: Family Medicine

## 2012-08-18 ENCOUNTER — Telehealth: Payer: Self-pay | Admitting: Family Medicine

## 2012-08-21 ENCOUNTER — Telehealth: Payer: Self-pay | Admitting: Family Medicine

## 2012-08-21 MED ORDER — TEMAZEPAM 30 MG PO CAPS: 30.0000 mg | ORAL_CAPSULE | Freq: Every day | ORAL | Status: DC

## 2012-08-26 NOTE — Telephone Encounter (Signed)
Temazepam has been faxed back

## 2012-09-09 ENCOUNTER — Other Ambulatory Visit: Payer: Self-pay | Admitting: Family Medicine

## 2012-10-10 ENCOUNTER — Ambulatory Visit (HOSPITAL_COMMUNITY): Payer: BC Managed Care – PPO

## 2012-11-04 ENCOUNTER — Other Ambulatory Visit: Payer: Self-pay | Admitting: Family Medicine

## 2012-11-20 ENCOUNTER — Other Ambulatory Visit: Payer: Self-pay

## 2012-11-20 ENCOUNTER — Other Ambulatory Visit: Payer: Self-pay | Admitting: Family Medicine

## 2012-11-20 MED ORDER — TEMAZEPAM 30 MG PO CAPS: 30.0000 mg | ORAL_CAPSULE | Freq: Every day | ORAL | Status: DC

## 2012-12-02 ENCOUNTER — Other Ambulatory Visit: Payer: Self-pay | Admitting: Family Medicine

## 2012-12-02 ENCOUNTER — Other Ambulatory Visit: Payer: Self-pay | Admitting: Gastroenterology

## 2012-12-02 NOTE — Telephone Encounter (Signed)
Due OV for further refills. RF PPI X 3.

## 2012-12-30 ENCOUNTER — Telehealth: Payer: Self-pay | Admitting: Family Medicine

## 2012-12-30 MED ORDER — TEMAZEPAM 30 MG PO CAPS: 30.0000 mg | ORAL_CAPSULE | Freq: Every day | ORAL | Status: DC

## 2012-12-30 NOTE — Telephone Encounter (Signed)
Will send in 30 days but needs appt

## 2013-01-06 ENCOUNTER — Other Ambulatory Visit: Payer: Self-pay | Admitting: Family Medicine

## 2013-02-03 ENCOUNTER — Other Ambulatory Visit: Payer: Self-pay

## 2013-02-03 MED ORDER — TEMAZEPAM 30 MG PO CAPS: 30.0000 mg | ORAL_CAPSULE | Freq: Every day | ORAL | Status: DC

## 2013-02-04 ENCOUNTER — Other Ambulatory Visit (HOSPITAL_COMMUNITY)
Admission: RE | Admit: 2013-02-04 | Discharge: 2013-02-04 | Disposition: A | Discharge status: Home / Self Care | Payer: BC Managed Care – PPO | Source: Ambulatory Visit | Attending: Family Medicine | Admitting: Family Medicine

## 2013-02-04 ENCOUNTER — Ambulatory Visit (INDEPENDENT_AMBULATORY_CARE_PROVIDER_SITE_OTHER): Payer: BC Managed Care – PPO | Admitting: Family Medicine

## 2013-02-04 ENCOUNTER — Encounter: Payer: Self-pay | Admitting: Family Medicine

## 2013-02-04 VITALS — BP 112/78 | HR 83 | Resp 16 | Ht 66.0 in | Wt 222.1 lb

## 2013-02-04 DIAGNOSIS — F324 Major depressive disorder, single episode, in partial remission: Secondary | ICD-10-CM | POA: Insufficient documentation

## 2013-02-04 DIAGNOSIS — F329 Major depressive disorder, single episode, unspecified: Secondary | ICD-10-CM

## 2013-02-04 DIAGNOSIS — Z01419 Encounter for gynecological examination (general) (routine) without abnormal findings: Secondary | ICD-10-CM | POA: Insufficient documentation

## 2013-02-04 DIAGNOSIS — F32A Depression, unspecified: Secondary | ICD-10-CM

## 2013-02-04 DIAGNOSIS — Z0000 Encounter for general adult medical examination without abnormal findings: Secondary | ICD-10-CM | POA: Diagnosis not present

## 2013-02-04 DIAGNOSIS — Z87898 Personal history of other specified conditions: Secondary | ICD-10-CM | POA: Insufficient documentation

## 2013-02-04 DIAGNOSIS — Z1151 Encounter for screening for human papillomavirus (HPV): Secondary | ICD-10-CM | POA: Insufficient documentation

## 2013-02-04 DIAGNOSIS — Z1211 Encounter for screening for malignant neoplasm of colon: Secondary | ICD-10-CM | POA: Diagnosis not present

## 2013-02-04 DIAGNOSIS — E119 Type 2 diabetes mellitus without complications: Secondary | ICD-10-CM

## 2013-02-04 DIAGNOSIS — M549 Dorsalgia, unspecified: Secondary | ICD-10-CM | POA: Diagnosis not present

## 2013-02-04 HISTORY — DX: Personal history of other specified conditions: Z87.898

## 2013-02-04 LAB — POC HEMOCCULT BLD/STL (OFFICE/1-CARD/DIAGNOSTIC)

## 2013-02-04 LAB — BASIC METABOLIC PANEL
BUN: 14 mg/dL (ref 6–23)
CO2: 30 mEq/L (ref 19–32)
Calcium: 9.4 mg/dL (ref 8.4–10.5)
Creat: 1.05 mg/dL (ref 0.50–1.10)
Glucose, Bld: 84 mg/dL (ref 70–99)

## 2013-02-04 MED ORDER — KETOROLAC TROMETHAMINE 60 MG/2ML IM SOLN
60.0000 mg | Freq: Once | INTRAMUSCULAR | Status: AC
  Administered 2013-02-04: 60 mg via INTRAMUSCULAR

## 2013-02-04 MED ORDER — OMEPRAZOLE 20 MG PO CPDR: DELAYED_RELEASE_CAPSULE | ORAL | Status: DC

## 2013-02-04 MED ORDER — METHYLPREDNISOLONE ACETATE 80 MG/ML IJ SUSP
80.0000 mg | Freq: Once | INTRAMUSCULAR | Status: AC
  Administered 2013-02-04: 80 mg via INTRAMUSCULAR

## 2013-02-04 MED ORDER — GABAPENTIN 800 MG PO TABS: ORAL_TABLET | ORAL | Status: DC

## 2013-02-04 MED ORDER — MELOXICAM 15 MG PO TABS: ORAL_TABLET | ORAL | Status: DC

## 2013-02-04 MED ORDER — FENTANYL 25 MCG/HR TD PT72: 1.0000 | MEDICATED_PATCH | TRANSDERMAL | Status: DC

## 2013-02-04 MED ORDER — FLUOXETINE HCL 20 MG PO CAPS: 20.0000 mg | ORAL_CAPSULE | Freq: Every day | ORAL | Status: DC

## 2013-02-04 MED ORDER — BACLOFEN 20 MG PO TABS: 20.0000 mg | ORAL_TABLET | Freq: Two times a day (BID) | ORAL | Status: DC

## 2013-02-04 MED ORDER — TEMAZEPAM 30 MG PO CAPS: 30.0000 mg | ORAL_CAPSULE | Freq: Every day | ORAL | Status: DC
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
## 2013-02-04 NOTE — Progress Notes (Signed)
  Subjective:    Patient ID: Brittany Nunez, female    DOB: 19-Oct-1960, 52 y.o.   MRN: [id number]  HPI Pt in for annual exam. C/o worsened back pain negatively impacting all aspects of her life, physical, mental and emotional Depression has significantly worsened , enjoys very little in life and does very little She is increasingly withdrawn and has not discussed her situation with her spouse who is and has always been supportive. Not suicidal or homicidal   Review of Systems See HPI Denies recent fever or chills. Denies sinus pressure, nasal congestion, ear pain or sore throat. Denies chest congestion, productive cough or wheezing. Denies chest pains, palpitations and leg swelling Denies abdominal pain, nausea, vomiting,diarrhea or constipation.   Denies dysuria, frequency, hesitancy or incontinence.  Denies headaches, seizures, numbness, or tingling.  Denies skin break down or rash.        Objective:   Physical Exam  Pleasant well nourished female, alert and oriented x 3, in no cardio-pulmonary distress. Afebrile. HEENT No facial trauma or asymetry. Sinuses non tender.  EOMI, PERTL, fundoscopic exam , no hemorhage or exudate.  External ears normal, tympanic membranes clear. Oropharynx moist, no exudate, fair dentition. Neck: supple, no adenopathy,JVD or thyromegaly.No bruits.  Chest: Clear to ascultation bilaterally.No crackles or wheezes. Non tender to palpation  Breast: No asymetry,no masses. No nipple discharge or inversion. No axillary or supraclavicular adenopathy  Cardiovascular system; Heart sounds normal,  S1 and  S2 ,no S3.  No murmur, or thrill. Apical beat not displaced Peripheral pulses normal.  Abdomen: Soft, non tender, no organomegaly or masses. No bruits. Bowel sounds normal. No guarding, tenderness or rebound.  Rectal:  No mass. Guaiac negative stool.  GU: External genitalia normal. No lesions. Vaginal canal normal.Physiologic  discharge. Uterus absent, no adnexal masses, no  adnexal tenderness.  Musculoskeletal exam: Decreased  ROM of spine and , hips adequate in , shoulders and knees. No deformity ,swelling or crepitus noted. No muscle wasting or atrophy.   Neurologic: Cranial nerves 2 to 12 intact. Power, tone ,sensation  normal throughout. No tremor. Gait slightly abnormal due to back pain Skin: Intact, no ulceration, erythema , scaling or rash noted. Pigmentation normal throughout  Psych; Severely deprssed, not suicidal or homicidal, no hallucinations       Assessment & Plan:

## 2013-02-09 ENCOUNTER — Ambulatory Visit (HOSPITAL_COMMUNITY)
Admission: RE | Admit: 2013-02-09 | Discharge: 2013-02-09 | Disposition: A | Discharge status: Home / Self Care | Payer: BC Managed Care – PPO | Source: Ambulatory Visit | Attending: Family Medicine | Admitting: Family Medicine

## 2013-02-09 DIAGNOSIS — M5126 Other intervertebral disc displacement, lumbar region: Secondary | ICD-10-CM | POA: Diagnosis not present

## 2013-02-09 DIAGNOSIS — M47817 Spondylosis without myelopathy or radiculopathy, lumbosacral region: Secondary | ICD-10-CM | POA: Insufficient documentation

## 2013-02-09 DIAGNOSIS — M549 Dorsalgia, unspecified: Secondary | ICD-10-CM

## 2013-02-09 DIAGNOSIS — M545 Low back pain, unspecified: Secondary | ICD-10-CM | POA: Insufficient documentation

## 2013-02-09 DIAGNOSIS — M5137 Other intervertebral disc degeneration, lumbosacral region: Secondary | ICD-10-CM | POA: Insufficient documentation

## 2013-02-09 DIAGNOSIS — M51379 Other intervertebral disc degeneration, lumbosacral region without mention of lumbar back pain or lower extremity pain: Secondary | ICD-10-CM | POA: Insufficient documentation

## 2013-02-10 ENCOUNTER — Other Ambulatory Visit: Payer: Self-pay | Admitting: Family Medicine

## 2013-02-10 DIAGNOSIS — M5126 Other intervertebral disc displacement, lumbar region: Secondary | ICD-10-CM

## 2013-02-11 ENCOUNTER — Other Ambulatory Visit: Payer: Self-pay | Admitting: Family Medicine

## 2013-02-11 DIAGNOSIS — M549 Dorsalgia, unspecified: Secondary | ICD-10-CM

## 2013-02-18 ENCOUNTER — Other Ambulatory Visit: Payer: Self-pay

## 2013-02-18 ENCOUNTER — Other Ambulatory Visit: Payer: BC Managed Care – PPO

## 2013-02-18 DIAGNOSIS — M549 Dorsalgia, unspecified: Secondary | ICD-10-CM

## 2013-02-18 MED ORDER — FENTANYL 25 MCG/HR TD PT72: 1.0000 | MEDICATED_PATCH | TRANSDERMAL | Status: AC

## 2013-02-19 ENCOUNTER — Ambulatory Visit
Admission: RE | Admit: 2013-02-19 | Discharge: 2013-02-19 | Disposition: A | Discharge status: Home / Self Care | Payer: BC Managed Care – PPO | Source: Ambulatory Visit | Attending: Family Medicine | Admitting: Family Medicine

## 2013-02-19 DIAGNOSIS — M549 Dorsalgia, unspecified: Secondary | ICD-10-CM

## 2013-02-19 MED ORDER — METHYLPREDNISOLONE ACETATE 40 MG/ML INJ SUSP (RADIOLOG
120.0000 mg | Freq: Once | INTRAMUSCULAR | Status: AC
  Administered 2013-02-19: 120 mg via EPIDURAL

## 2013-02-19 MED ORDER — IOHEXOL 180 MG/ML  SOLN
1.0000 mL | Freq: Once | INTRAMUSCULAR | Status: AC | PRN
  Administered 2013-02-19: 1 mL via EPIDURAL

## 2013-02-20 NOTE — Assessment & Plan Note (Signed)
Pelvic and breast exam as documented Pt counseled re need to reduce caloric intake and , though painful, make an effort to commit to regular physical activity. Depression needs therapy but unwilling currently. Pain amnagement is being addressed aggressively as her quality of life is negatively impacted currently

## 2013-02-20 NOTE — Assessment & Plan Note (Signed)
Worsened pain and uncontrolled, add fentanyl patch and refer for injection

## 2013-02-20 NOTE — Assessment & Plan Note (Signed)
owrsened and uncontrolled, increase prozac dose and refer for therapy  When pt agrees

## 2013-02-27 ENCOUNTER — Ambulatory Visit (HOSPITAL_COMMUNITY): Payer: Medicare Other | Admitting: Physical Therapy

## 2013-03-04 ENCOUNTER — Other Ambulatory Visit: Payer: Self-pay | Admitting: Family Medicine

## 2013-03-04 ENCOUNTER — Other Ambulatory Visit: Payer: Self-pay

## 2013-03-04 MED ORDER — TEMAZEPAM 30 MG PO CAPS: 30.0000 mg | ORAL_CAPSULE | Freq: Every day | ORAL | Status: DC

## 2013-03-05 NOTE — Addendum Note (Signed)
Addended by: [name] E on: 03/05/2013 12:38 PM   Modules accepted: Level of Service

## 2013-03-10 ENCOUNTER — Other Ambulatory Visit: Payer: Self-pay | Admitting: Family Medicine

## 2013-04-13 ENCOUNTER — Encounter: Payer: Self-pay | Admitting: Family Medicine

## 2013-04-13 ENCOUNTER — Ambulatory Visit (INDEPENDENT_AMBULATORY_CARE_PROVIDER_SITE_OTHER): Payer: BC Managed Care – PPO | Admitting: Family Medicine

## 2013-04-13 VITALS — BP 120/80 | HR 87 | Resp 16 | Ht 66.0 in | Wt 220.0 lb

## 2013-04-13 DIAGNOSIS — Z23 Encounter for immunization: Secondary | ICD-10-CM

## 2013-04-13 DIAGNOSIS — G4700 Insomnia, unspecified: Secondary | ICD-10-CM

## 2013-04-13 DIAGNOSIS — M549 Dorsalgia, unspecified: Secondary | ICD-10-CM

## 2013-04-13 DIAGNOSIS — E669 Obesity, unspecified: Secondary | ICD-10-CM

## 2013-04-13 DIAGNOSIS — E119 Type 2 diabetes mellitus without complications: Secondary | ICD-10-CM

## 2013-04-13 DIAGNOSIS — R5381 Other malaise: Secondary | ICD-10-CM

## 2013-04-13 DIAGNOSIS — L509 Urticaria, unspecified: Secondary | ICD-10-CM | POA: Diagnosis not present

## 2013-04-13 DIAGNOSIS — F329 Major depressive disorder, single episode, unspecified: Secondary | ICD-10-CM

## 2013-04-13 DIAGNOSIS — D649 Anemia, unspecified: Secondary | ICD-10-CM

## 2013-04-13 DIAGNOSIS — Z79899 Other long term (current) drug therapy: Secondary | ICD-10-CM

## 2013-04-13 DIAGNOSIS — Z139 Encounter for screening, unspecified: Secondary | ICD-10-CM

## 2013-04-13 DIAGNOSIS — F32A Depression, unspecified: Secondary | ICD-10-CM

## 2013-04-13 MED ORDER — GABAPENTIN 800 MG PO TABS: ORAL_TABLET | ORAL | Status: DC

## 2013-04-13 MED ORDER — CETIRIZINE HCL 10 MG PO TABS: 10.0000 mg | ORAL_TABLET | Freq: Every day | ORAL | Status: DC

## 2013-04-13 MED ORDER — SAXAGLIPTIN-METFORMIN ER 2.5-1000 MG PO TB24: ORAL_TABLET | ORAL | Status: DC

## 2013-04-13 MED ORDER — MELOXICAM 15 MG PO TABS: ORAL_TABLET | ORAL | Status: DC

## 2013-04-13 MED ORDER — FLUOXETINE HCL 20 MG PO CAPS: 20.0000 mg | ORAL_CAPSULE | Freq: Every day | ORAL | Status: DC

## 2013-04-13 MED ORDER — BACLOFEN 20 MG PO TABS: 20.0000 mg | ORAL_TABLET | Freq: Two times a day (BID) | ORAL | Status: DC

## 2013-04-13 MED ORDER — OMEPRAZOLE 20 MG PO CPDR: DELAYED_RELEASE_CAPSULE | ORAL | Status: DC

## 2013-04-13 NOTE — Progress Notes (Signed)
  Subjective:    Patient ID: Brittany Nunez, female    DOB: Dec 18, 1960, 52 y.o.   MRN: [id number]  HPI The PT is here for follow up and re-evaluation of chronic medical conditions, medication management and review of any available recent lab and radiology data.  Preventive health is updated, specifically  Cancer screening and Immunization.   Questions or concerns regarding consultations or procedures which the PT has had in the interim are  addressed. The PT denies any adverse reactions to current medications since the last visit.  2 week h/o intermittent swelling/bumps on upper lips , no cold sores, no purulent drainage, last night whelp under left breast, gone today, this morning  Red rash on left hip. No change in food, drugs , personal care items. No known trigger, duration less than 24 hours, uses no medication used. Only left upper lip which was swollen 3 days ago still has residual slight swelling Blood sugar average fasting is between 110 to 130, denies polyuria, polydipsia, blurred vision or hypoglycemic episodes    Review of Systems See HPI Denies recent fever or chills. Denies sinus pressure, nasal congestion, ear pain or sore throat. Denies chest congestion, productive cough or wheezing. Denies chest pains, palpitations and leg swelling Denies abdominal pain, nausea, vomiting,diarrhea or constipation.   Denies dysuria, frequency, hesitancy or incontinence. Chronic back pain improved since earlier this year recent epidural was very helpful, which has also helped her mental state Denies headaches, seizures, numbness, or tingling. Denies uncontrolled  depression, anxiety or insomnia.       Objective:   Physical Exam Patient alert and oriented and in no cardiopulmonary distress.  HEENT: No facial asymmetry, EOMI, no sinus tenderness,  oropharynx pink and moist.  Neck supple no adenopathy.  Chest: Clear to auscultation bilaterally.  CVS: S1, S2 no murmurs, no S3.  ABD: Soft  non tender. Bowel sounds normal.  Ext: No edema  MS: Adequate though reduced  ROM spine, shoulders, hips and knees.  Skin: Intact, no ulcerations or rash noted.  Psych: Good eye contact, normal affect. Memory intact not anxious or depressed appearing.  CNS: CN 2-12 intact, power, tone and sensation normal throughout.        Assessment & Plan:

## 2013-04-13 NOTE — Patient Instructions (Addendum)
F/u  Early January, call if you need me before  Flu vaccine today  Happy that pain and depression and sleep are all improved.Continue same medications  New for whelps is once daily zyrtec (certrizine) if problem continues , call for referral to allergist  Please start daily physical activity for 30 minutes , weight loss goal of 2.5 pounds per month  Fasting lipid, cmp and EGFr, HBA1C, cBC, vit D , TSH end December, 28 or after

## 2013-05-26 NOTE — Assessment & Plan Note (Signed)
Mrked improvement in symptom following epidural

## 2013-05-26 NOTE — Assessment & Plan Note (Signed)
Sleep hygiene reviewed, sleep is much better, no change in current meds

## 2013-05-26 NOTE — Assessment & Plan Note (Signed)
Marked improvement continue current meds

## 2013-05-26 NOTE — Assessment & Plan Note (Signed)
Controlled, no change in medication Patient advised to reduce carb and sweets, commit to regular physical activity, take meds as prescribed, test blood as directed, and attempt to lose weight, to improve blood sugar control.  

## 2013-05-26 NOTE — Assessment & Plan Note (Signed)
Allergy based rash triggered by anxiety likely , trial of daily zyrtec

## 2013-05-26 NOTE — Assessment & Plan Note (Signed)
Unchnaged. Patient re-educated about  the importance of commitment to a  minimum of 150 minutes of exercise per week. The importance of healthy food choices with portion control discussed. Encouraged to start a food diary, count calories and to consider  joining a support group. Sample diet sheets offered. Goals set by the patient for the next several months.    

## 2013-06-29 ENCOUNTER — Ambulatory Visit: Payer: BC Managed Care – PPO | Admitting: Family Medicine

## 2013-07-02 ENCOUNTER — Other Ambulatory Visit: Payer: Self-pay | Admitting: Family Medicine

## 2013-07-02 DIAGNOSIS — E119 Type 2 diabetes mellitus without complications: Secondary | ICD-10-CM | POA: Diagnosis not present

## 2013-07-02 DIAGNOSIS — D649 Anemia, unspecified: Secondary | ICD-10-CM | POA: Diagnosis not present

## 2013-07-02 DIAGNOSIS — D539 Nutritional anemia, unspecified: Secondary | ICD-10-CM | POA: Diagnosis not present

## 2013-07-02 DIAGNOSIS — Z139 Encounter for screening, unspecified: Secondary | ICD-10-CM | POA: Diagnosis not present

## 2013-07-02 DIAGNOSIS — Z79899 Other long term (current) drug therapy: Secondary | ICD-10-CM | POA: Diagnosis not present

## 2013-07-02 DIAGNOSIS — R5381 Other malaise: Secondary | ICD-10-CM | POA: Diagnosis not present

## 2013-07-02 LAB — CBC WITH DIFFERENTIAL/PLATELET
Basophils Absolute: 0 10*3/uL (ref 0.0–0.1)
Basophils Relative: 0 % (ref 0–1)
Eosinophils Absolute: 0.2 10*3/uL (ref 0.0–0.7)
Eosinophils Relative: 3 % (ref 0–5)
HCT: 32.3 % — ABNORMAL LOW (ref 36.0–46.0)
Hemoglobin: 10.7 g/dL — ABNORMAL LOW (ref 12.0–15.0)
Lymphocytes Relative: 28 % (ref 12–46)
Lymphs Abs: 1.6 10*3/uL (ref 0.7–4.0)
MCH: 25.4 pg — ABNORMAL LOW (ref 26.0–34.0)
MCHC: 33.1 g/dL (ref 30.0–36.0)
MCV: 76.7 fL — ABNORMAL LOW (ref 78.0–100.0)
Monocytes Absolute: 0.4 10*3/uL (ref 0.1–1.0)
Monocytes Relative: 6 % (ref 3–12)
Neutro Abs: 3.8 10*3/uL (ref 1.7–7.7)
Neutrophils Relative %: 63 % (ref 43–77)
Platelets: 330 10*3/uL (ref 150–400)
RBC: 4.21 MIL/uL (ref 3.87–5.11)
RDW: 15.5 % (ref 11.5–15.5)
WBC: 5.9 10*3/uL (ref 4.0–10.5)

## 2013-07-02 LAB — HEMOGLOBIN A1C
Hgb A1c MFr Bld: 6.1 % — ABNORMAL HIGH (ref <5.7)
Mean Plasma Glucose: 128 mg/dL — ABNORMAL HIGH (ref <117)

## 2013-07-02 LAB — LIPID PANEL
Cholesterol: 141 mg/dL (ref 0–200)
HDL: 35 mg/dL — ABNORMAL LOW (ref >39)
LDL Cholesterol: 84 mg/dL (ref 0–99)
Total CHOL/HDL Ratio: 4 Ratio
Triglycerides: 108 mg/dL (ref <150)
VLDL: 22 mg/dL (ref 0–40)

## 2013-07-02 LAB — COMPLETE METABOLIC PANEL WITH GFR
ALT: 22 U/L (ref 0–35)
AST: 22 U/L (ref 0–37)
Albumin: 3.7 g/dL (ref 3.5–5.2)
Alkaline Phosphatase: 98 U/L (ref 39–117)
BUN: 7 mg/dL (ref 6–23)
CO2: 32 mEq/L (ref 19–32)
Calcium: 9.1 mg/dL (ref 8.4–10.5)
Chloride: 101 mEq/L (ref 96–112)
Creat: 0.97 mg/dL (ref 0.50–1.10)
GFR, Est African American: 77 mL/min
GFR, Est Non African American: 67 mL/min
Glucose, Bld: 96 mg/dL (ref 70–99)
Potassium: 4.2 mEq/L (ref 3.5–5.3)
Sodium: 138 mEq/L (ref 135–145)
Total Bilirubin: 0.4 mg/dL (ref 0.3–1.2)
Total Protein: 6.5 g/dL (ref 6.0–8.3)

## 2013-07-03 LAB — TSH: TSH: 2.1 u[IU]/mL (ref 0.350–4.500)

## 2013-07-03 LAB — VITAMIN D 25 HYDROXY (VIT D DEFICIENCY, FRACTURES): Vit D, 25-Hydroxy: 11 ng/mL — ABNORMAL LOW (ref 30–89)

## 2013-07-03 LAB — FERRITIN: FERRITIN: 44 ng/mL (ref 10–291)

## 2013-07-03 LAB — IRON: Iron: 71 ug/dL (ref 42–145)

## 2013-07-06 ENCOUNTER — Encounter: Payer: Self-pay | Admitting: Gastroenterology

## 2013-07-06 ENCOUNTER — Encounter: Payer: Self-pay | Admitting: Family Medicine

## 2013-07-06 ENCOUNTER — Ambulatory Visit (INDEPENDENT_AMBULATORY_CARE_PROVIDER_SITE_OTHER): Payer: BC Managed Care – PPO | Admitting: Family Medicine

## 2013-07-06 VITALS — BP 112/74 | HR 98 | Resp 16 | Ht 66.0 in | Wt 220.0 lb

## 2013-07-06 DIAGNOSIS — F329 Major depressive disorder, single episode, unspecified: Secondary | ICD-10-CM

## 2013-07-06 DIAGNOSIS — R232 Flushing: Secondary | ICD-10-CM

## 2013-07-06 DIAGNOSIS — J309 Allergic rhinitis, unspecified: Secondary | ICD-10-CM

## 2013-07-06 DIAGNOSIS — F3289 Other specified depressive episodes: Secondary | ICD-10-CM | POA: Diagnosis not present

## 2013-07-06 DIAGNOSIS — E559 Vitamin D deficiency, unspecified: Secondary | ICD-10-CM | POA: Diagnosis not present

## 2013-07-06 DIAGNOSIS — F32A Depression, unspecified: Secondary | ICD-10-CM

## 2013-07-06 DIAGNOSIS — B356 Tinea cruris: Secondary | ICD-10-CM | POA: Diagnosis not present

## 2013-07-06 DIAGNOSIS — N951 Menopausal and female climacteric states: Secondary | ICD-10-CM | POA: Diagnosis not present

## 2013-07-06 DIAGNOSIS — J302 Other seasonal allergic rhinitis: Secondary | ICD-10-CM

## 2013-07-06 DIAGNOSIS — E669 Obesity, unspecified: Secondary | ICD-10-CM

## 2013-07-06 DIAGNOSIS — K219 Gastro-esophageal reflux disease without esophagitis: Secondary | ICD-10-CM

## 2013-07-06 DIAGNOSIS — E119 Type 2 diabetes mellitus without complications: Secondary | ICD-10-CM

## 2013-07-06 DIAGNOSIS — M549 Dorsalgia, unspecified: Secondary | ICD-10-CM

## 2013-07-06 DIAGNOSIS — D649 Anemia, unspecified: Secondary | ICD-10-CM

## 2013-07-06 MED ORDER — ERGOCALCIFEROL 1.25 MG (50000 UT) PO CAPS: 50000.0000 [IU] | ORAL_CAPSULE | ORAL | Status: DC

## 2013-07-06 MED ORDER — CLOTRIMAZOLE-BETAMETHASONE 1-0.05 % EX CREA: 1.0000 "application " | TOPICAL_CREAM | Freq: Two times a day (BID) | CUTANEOUS | Status: AC

## 2013-07-06 MED ORDER — VENLAFAXINE HCL ER 37.5 MG PO CP24: 37.5000 mg | ORAL_CAPSULE | Freq: Every day | ORAL | Status: DC

## 2013-07-06 MED ORDER — TERBINAFINE HCL 250 MG PO TABS: 250.0000 mg | ORAL_TABLET | Freq: Every day | ORAL | Status: AC

## 2013-07-06 NOTE — Patient Instructions (Addendum)
F/u in 4 month, call if you need me before  Please commit to daily exercise to raise your "good/protective" cholesterol level and improve your overall health.  Tablets for 2 weeks and an antifungal cream are prescribed for your rash  New  For hot flashes is effexor once daily  New for Vit D deficiency is once weekly vit D  Microalb today  Non fasting chem 7 and EGFr and HBa1C in 4 month  Weight loss goal of 5 pounds  You are referred to Dr Oneida Alar re anemia, you may need to change the medication you are taking for arthritis , we will wait on her recommendation per your request

## 2013-07-07 LAB — MICROALBUMIN / CREATININE URINE RATIO
CREATININE, URINE: 58 mg/dL
MICROALB UR: 0.98 mg/dL (ref 0.00–1.89)
MICROALB/CREAT RATIO: 16.9 mg/g (ref 0.0–30.0)

## 2013-07-13 DIAGNOSIS — J302 Other seasonal allergic rhinitis: Secondary | ICD-10-CM

## 2013-07-13 DIAGNOSIS — K219 Gastro-esophageal reflux disease without esophagitis: Secondary | ICD-10-CM | POA: Insufficient documentation

## 2013-07-13 HISTORY — DX: Other seasonal allergic rhinitis: J30.2

## 2013-07-13 NOTE — Assessment & Plan Note (Signed)
Needs to commit to weekly vit D x 6 month

## 2013-07-13 NOTE — Assessment & Plan Note (Signed)
Controlled, no change in medication Patient advised to reduce carb and sweets, commit to regular physical activity, take meds as prescribed, test blood as directed, and attempt to lose weight, to improve blood sugar control.  

## 2013-07-13 NOTE — Progress Notes (Signed)
   Subjective:    Patient ID: Brittany Nunez, female    DOB: 12-16-1960, 53 y.o.   MRN: [id number]  HPI The PT is here for follow up and re-evaluation of chronic medical conditions, medication management and review of any available recent lab and radiology data.  Preventive health is updated, specifically  Cancer screening and Immunization.    The PT denies any adverse reactions to current medications since the last visit.  2 week h/o pruritic groin rash, no purulent drainage, fever or chills has had this in the past Checks BG about 3 times per week, Fasting seldom over 120     Review of Systems See HPI Denies recent fever or chills. Denies sinus pressure, nasal congestion, ear pain or sore throat. Denies chest congestion, productive cough or wheezing. Denies chest pains, palpitations and leg swelling Denies abdominal pain, nausea, vomiting,diarrhea or constipation.   Denies dysuria, frequency, hesitancy or incontinence. Chronic back pain and limitation in mobility.controlled on current medication Denies headaches, seizures, numbness, or tingling. Denies uncontrolled depression, anxiety or insomnia.       Objective:   Physical Exam Patient alert and oriented and in no cardiopulmonary distress.  HEENT: No facial asymmetry, EOMI, no sinus tenderness,  oropharynx pink and moist.  Neck supple no adenopathy.  Chest: Clear to auscultation bilaterally.  CVS: S1, S2 no murmurs, no S3.  ABD: Soft non tender. Bowel sounds normal.  Ext: No edema  MS: Adequate though reduced  ROM spine,  Normal in shoulders, hips and knees.  Skin: extensive tinea cruris infection, no erythema or purulent drainage  Psych: Good eye contact, normal affect. Memory mildly impaired t not anxious or depressed appearing.  CNS: CN 2-12 intact, power, normal throughout.        Assessment & Plan:

## 2013-07-13 NOTE — Assessment & Plan Note (Signed)
Controlled, no change in medication  

## 2013-07-13 NOTE — Assessment & Plan Note (Signed)
Unchanged. Patient re-educated about  the importance of commitment to a  minimum of 150 minutes of exercise per week. The importance of healthy food choices with portion control discussed. Encouraged to start a food diary, count calories and to consider  joining a support group. Sample diet sheets offered. Goals set by the patient for the next several months.    

## 2013-07-13 NOTE — Assessment & Plan Note (Signed)
Flare of infection in groin , topical and oral meds prescribed, pt ed re keeping area dry

## 2013-07-13 NOTE — Assessment & Plan Note (Signed)
Improved and controlled continue current med

## 2013-07-13 NOTE — Assessment & Plan Note (Signed)
Disabling increased in severity start effexor, continue other non pharmacologic practicces

## 2013-07-27 ENCOUNTER — Other Ambulatory Visit: Payer: Self-pay | Admitting: Family Medicine

## 2013-07-30 ENCOUNTER — Ambulatory Visit: Payer: BC Managed Care – PPO | Admitting: Gastroenterology

## 2013-08-05 ENCOUNTER — Encounter: Payer: Self-pay | Admitting: Gastroenterology

## 2013-08-05 ENCOUNTER — Other Ambulatory Visit: Payer: Self-pay | Admitting: Gastroenterology

## 2013-08-05 ENCOUNTER — Ambulatory Visit (INDEPENDENT_AMBULATORY_CARE_PROVIDER_SITE_OTHER): Payer: BC Managed Care – PPO | Admitting: Gastroenterology

## 2013-08-05 VITALS — BP 120/80 | HR 96 | Temp 96.9°F | Ht 65.0 in | Wt 211.8 lb

## 2013-08-05 DIAGNOSIS — K219 Gastro-esophageal reflux disease without esophagitis: Secondary | ICD-10-CM

## 2013-08-05 DIAGNOSIS — D649 Anemia, unspecified: Secondary | ICD-10-CM | POA: Diagnosis not present
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
## 2013-08-05 NOTE — Assessment & Plan Note (Signed)
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 EGD/TCS < 2 YEARS AGO FOR ANEMIA.  GIVENS CAPSULE FOR OBSCURE GI BLEED. HOLD IRON FOR 7 DAYS. CONSIDER HOLDING MOBIC FOR 3 MOS AND RECHECK HB AFTER GIVENS. CONSIDER HEMATOLOGY REFERRAL IN 3 MOS IF HB NOT IMPROVED. OPV IN 4 MOS

## 2013-08-05 NOTE — Patient Instructions (Signed)
GIVENS CAPSULE NEXT WEEK.

## 2013-08-05 NOTE — Assessment & Plan Note (Signed)
SX CONTROLLED.  CONTINUE PRILOSEC.

## 2013-08-06 ENCOUNTER — Other Ambulatory Visit: Payer: Self-pay | Admitting: Family Medicine

## 2013-08-06 ENCOUNTER — Encounter (HOSPITAL_COMMUNITY): Payer: Self-pay | Admitting: Pharmacy Technician

## 2013-08-06 NOTE — Progress Notes (Signed)
cc'd to pcp 

## 2013-08-07 ENCOUNTER — Encounter (HOSPITAL_COMMUNITY): Payer: Self-pay | Admitting: Pharmacy Technician

## 2013-08-10 ENCOUNTER — Telehealth: Payer: Self-pay

## 2013-08-10 ENCOUNTER — Other Ambulatory Visit: Payer: Self-pay | Admitting: Gastroenterology

## 2013-08-10 ENCOUNTER — Encounter (HOSPITAL_COMMUNITY): Payer: Self-pay | Admitting: Pharmacy Technician

## 2013-08-10 NOTE — Telephone Encounter (Signed)
[facility] and she stated the patient wanted to reschedule her appt.  The patient stated she didn't receive any instuctions

## 2013-08-10 NOTE — Telephone Encounter (Signed)
Dr. Darrick PennaFields do you want an EGD with Givens placement before we R/S this appointment, please advise

## 2013-08-10 NOTE — Telephone Encounter (Signed)
GIVENS CAPSULE only FOR OBSCURE GI BLEED. HOLD IRON FOR 7 DAYS.

## 2013-08-10 NOTE — Telephone Encounter (Signed)
Hospital canceled her givens study. She will need to be rescheduled.

## 2013-08-10 NOTE — Telephone Encounter (Signed)
NOTED

## 2013-08-10 NOTE — Telephone Encounter (Signed)
REVIEWED.  

## 2013-08-10 NOTE — Telephone Encounter (Signed)
R/S FOR 2/26 AND I HAVE MAILED HER INSTRUCTIONS

## 2013-08-11 ENCOUNTER — Ambulatory Visit (HOSPITAL_COMMUNITY)
Admission: RE | Admit: 2013-08-11 | Payer: BC Managed Care – PPO | Source: Ambulatory Visit | Admitting: Gastroenterology

## 2013-08-11 ENCOUNTER — Encounter (HOSPITAL_COMMUNITY): Admission: RE | Payer: Self-pay | Source: Ambulatory Visit

## 2013-08-11 SURGERY — IMAGING PROCEDURE, GI TRACT, INTRALUMINAL, VIA CAPSULE

## 2013-08-19 NOTE — Progress Notes (Signed)
Reminder in epic °

## 2013-08-24 ENCOUNTER — Encounter (HOSPITAL_COMMUNITY)
Admission: RE | Disposition: A | Discharge status: Home / Self Care | Payer: Self-pay | Source: Ambulatory Visit | Attending: Gastroenterology

## 2013-08-24 ENCOUNTER — Ambulatory Visit (HOSPITAL_COMMUNITY)
Admission: RE | Admit: 2013-08-24 | Discharge: 2013-08-24 | Disposition: A | Discharge status: Home / Self Care | Payer: BC Managed Care – PPO | Source: Ambulatory Visit | Attending: Gastroenterology | Admitting: Gastroenterology

## 2013-08-24 DIAGNOSIS — K922 Gastrointestinal hemorrhage, unspecified: Secondary | ICD-10-CM

## 2013-08-24 DIAGNOSIS — D649 Anemia, unspecified: Secondary | ICD-10-CM

## 2013-08-24 DIAGNOSIS — D509 Iron deficiency anemia, unspecified: Secondary | ICD-10-CM | POA: Insufficient documentation

## 2013-08-24 HISTORY — PX: GIVENS CAPSULE STUDY: SHX5432

## 2013-08-24 SURGERY — IMAGING PROCEDURE, GI TRACT, INTRALUMINAL, VIA CAPSULE

## 2013-08-24 MED ORDER — SIMETHICONE 40 MG/0.6ML PO SUSP
ORAL | Status: AC
  Filled 2013-08-24: qty 0.6

## 2013-08-28 ENCOUNTER — Encounter (HOSPITAL_COMMUNITY): Payer: Self-pay | Admitting: Gastroenterology

## 2013-09-01 ENCOUNTER — Telehealth: Payer: Self-pay | Admitting: *Deleted

## 2013-09-01 NOTE — Telephone Encounter (Signed)
Pt called wanting to get the results from her swallowing study she had done last month. Please advise [phone]

## 2013-09-01 NOTE — Telephone Encounter (Signed)
Could not reach at the number listed here, but called mobile. Pt said she had her test done on 08/24/2013. I told her it takes a little longer for the Givens, but I will let them know that she has asked about it.

## 2013-09-08 NOTE — Telephone Encounter (Signed)
Routing to Dr. Fields.  

## 2013-09-10 ENCOUNTER — Telehealth: Payer: Self-pay | Admitting: *Deleted

## 2013-09-10 NOTE — Telephone Encounter (Signed)
PLEASE CALL PT. WE WILL CALL HER WITH HER RESULTS MAR 20.

## 2013-09-10 NOTE — Telephone Encounter (Signed)
Pt is aware that we will call on 09/11/2013 with her results.

## 2013-09-10 NOTE — Telephone Encounter (Signed)
Pt called wanting to get her results from her swallowing study. Please Advise [phone]

## 2013-09-10 NOTE — Telephone Encounter (Signed)
Tried to call pt. Someone picked up, noise and someone talking in the background, but no one ever spoke to me.  I have not received these results.

## 2013-09-11 NOTE — Op Note (Signed)
[facility] [address] [place], [id number]   ENDOSCOPY PROCEDURE REPORT  PATIENT: Brittany Nunez, Brittany Nunez  MR#: [id number] BIRTHDATE: 1960/10/17 , 53  yrs. old GENDER: Female  ENDOSCOPIST: Jonette EvaSandi Yasmene Salomone, MD REFERRED [id number]:Margaret Lodema HongSimpson, M.D.  PROCEDURE DATE: 11/22/2011 PROCEDURE:   EGD w/ biopsy  INDICATIONS:Anemia. MEDICATIONS: TOPICAL ANESTHETIC:   Cetacaine Spray  DESCRIPTION OF PROCEDURE:     Physical exam was performed.  Informed consent was obtained from the patient after explaining the benefits, risks, and alternatives to the procedure.  The patient was connected to the monitor and placed in the left lateral position.  Continuous oxygen was provided by nasal cannula and IV medicine administered through an indwelling cannula.  After administration of sedation, the patients esophagus was intubated and the     endoscope was advanced under direct visualization to the second portion of the duodenum.  The scope was removed slowly by carefully examining the color, texture, anatomy, and integrity of the mucosa on the way out.  The patient was recovered in endoscopy and discharged home in satisfactory condition.   ESOPHAGUS: The mucosa of the esophagus appeared normal.   A small hiatal hernia was noted.   STOMACH: Mild non-erosive gastritis (inflammation) was found in the gastric antrum.  Multiple biopsies were performed using cold forceps.   DUODENUM: The duodenal mucosa showed no abnormalities in the bulb and second portion of the duodenum.  Cold forcep biopsies were taken in the second portion.  COMPLICATIONS:   None  ENDOSCOPIC IMPRESSION: 1.   MILD ANEMIA POSSIBLY DUE TO GASTRITIS 2.   Small hiatal hernia  RECOMMENDATIONS: START OMEPRAZOLE EVERY MORNING. AVOID ITEMS THAT TRIGGER GASTRITIS. FOLLOW A HIGH FIBER/LOW FAT DIET.  AVOID ITEMS THAT CAUSE BLOATING.  BIOPSY RESULTS WILL BE AVAILABLE IN 7 DAYS. FOLLOW UP IN 4 MOS.  RECHECK BLOOD COUNT. Next  colonoscopy in 10 years.   REPEAT EXAM:   _______________________________ Rosalie DoctoreSignedJonette Eva:  Cage Gupton, MD 09/11/2013 2:08 PM

## 2013-09-11 NOTE — Procedures (Signed)
INDICATION: OBSCURE GI BLEED/MICROCYTIC ANEMIA(HB 10.7 MCV 76.7) WITH NORMAL ZOXWRUEA(54FERRITIN(44) IN JAN 2015  PATIENT DATA: WEIGHT:  211 lbs  WAIST: 34 in HEIGHT: 65 in GASTRIC PASSAGE TIME: 1H 45 m, SB PASSAGE TIME: 5H 3368m-NO DATA RECORDED FROM 3 h 29 m to 4 h 29 m  RESULTS: LIMITED views of gastric mucosa due to retained contents.  No ULCERS, masses or AVMs seen IN VISUALIZED PORTION OF SMALL BOWEL. LIMITED VIEWS OF THE COLON DUE TO RETAINED CONTENTS. No old blood or fresh blood in the stomach, small bowel, or colon.  DIAGNOSIS: NORMAL GIVENS STUDY  Plan: 1. HEMATOLOGY REFERRAL FOR ANEMIA 2. HOLD MOBIC FOR 3 MOS AND RECHECK HB AFTER  NEXT OPV  3. OPV IN 4 MOS
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
## 2013-09-11 NOTE — Telephone Encounter (Signed)
See phone note of 09/10/2013.

## 2013-09-14 ENCOUNTER — Other Ambulatory Visit: Payer: Self-pay

## 2013-09-14 DIAGNOSIS — D649 Anemia, unspecified: Secondary | ICD-10-CM

## 2013-09-14 NOTE — Telephone Encounter (Signed)
Lab order on file for CBC in June 2015.

## 2013-09-30 ENCOUNTER — Telehealth: Payer: Self-pay | Admitting: Family Medicine

## 2013-09-30 NOTE — Telephone Encounter (Signed)
Pls contact pt and let her know that I am r ecommending she take a "statin" to reduce her risk of heart disease since she is a diabetic. This is the medical standard of good care, her insurance has notified me about the fact that she is on noe, Pls erx if she agrees lipitor 10mg  one daily #30 refill 5,or lovastatin 20mg  one at night same duration, whichever is covered, if she agrees, thanks. If disagrees, just document, I will address at next visit further, no need to send me a return msg. Explain though her cholesterol is good, the medication still has benefit

## 2013-10-07 DIAGNOSIS — E119 Type 2 diabetes mellitus without complications: Secondary | ICD-10-CM | POA: Diagnosis not present

## 2013-10-07 LAB — HEMOGLOBIN A1C
HEMOGLOBIN A1C: 6 % — AB (ref <5.7)
MEAN PLASMA GLUCOSE: 126 mg/dL — AB (ref <117)

## 2013-10-08 LAB — BASIC METABOLIC PANEL WITH GFR
BUN: 8 mg/dL (ref 6–23)
CHLORIDE: 103 meq/L (ref 96–112)
CO2: 25 meq/L (ref 19–32)
Calcium: 8.8 mg/dL (ref 8.4–10.5)
Creat: 1.01 mg/dL (ref 0.50–1.10)
GFR, Est African American: 73 mL/min
GFR, Est Non African American: 63 mL/min
GLUCOSE: 89 mg/dL (ref 70–99)
POTASSIUM: 4 meq/L (ref 3.5–5.3)
SODIUM: 138 meq/L (ref 135–145)

## 2013-10-12 ENCOUNTER — Encounter: Payer: Self-pay | Admitting: Family Medicine

## 2013-10-12 ENCOUNTER — Ambulatory Visit (INDEPENDENT_AMBULATORY_CARE_PROVIDER_SITE_OTHER): Payer: BC Managed Care – PPO | Admitting: Family Medicine

## 2013-10-12 VITALS — BP 116/74 | HR 100 | Temp 98.3°F | Resp 18 | Ht 66.0 in | Wt 193.0 lb

## 2013-10-12 DIAGNOSIS — E119 Type 2 diabetes mellitus without complications: Secondary | ICD-10-CM | POA: Diagnosis not present

## 2013-10-12 DIAGNOSIS — J029 Acute pharyngitis, unspecified: Secondary | ICD-10-CM

## 2013-10-12 DIAGNOSIS — J309 Allergic rhinitis, unspecified: Secondary | ICD-10-CM | POA: Diagnosis not present

## 2013-10-12 DIAGNOSIS — R42 Dizziness and giddiness: Secondary | ICD-10-CM | POA: Diagnosis not present

## 2013-10-12 DIAGNOSIS — M549 Dorsalgia, unspecified: Secondary | ICD-10-CM

## 2013-10-12 DIAGNOSIS — F3289 Other specified depressive episodes: Secondary | ICD-10-CM

## 2013-10-12 DIAGNOSIS — F329 Major depressive disorder, single episode, unspecified: Secondary | ICD-10-CM

## 2013-10-12 DIAGNOSIS — J302 Other seasonal allergic rhinitis: Secondary | ICD-10-CM

## 2013-10-12 DIAGNOSIS — E669 Obesity, unspecified: Secondary | ICD-10-CM

## 2013-10-12 DIAGNOSIS — N951 Menopausal and female climacteric states: Secondary | ICD-10-CM

## 2013-10-12 DIAGNOSIS — F32A Depression, unspecified: Secondary | ICD-10-CM

## 2013-10-12 DIAGNOSIS — R232 Flushing: Secondary | ICD-10-CM

## 2013-10-12 DIAGNOSIS — J040 Acute laryngitis: Secondary | ICD-10-CM

## 2013-10-12 LAB — POCT RAPID STREP A (OFFICE): RAPID STREP A SCREEN: NEGATIVE

## 2013-10-12 MED ORDER — PROMETHAZINE-DM 6.25-15 MG/5ML PO SYRP: ORAL_SOLUTION | ORAL | Status: DC

## 2013-10-12 MED ORDER — MECLIZINE HCL 12.5 MG PO TABS: 12.5000 mg | ORAL_TABLET | Freq: Three times a day (TID) | ORAL | Status: DC | PRN

## 2013-10-12 MED ORDER — FLUTICASONE PROPIONATE 50 MCG/ACT NA SUSP: 2.0000 | Freq: Every day | NASAL | Status: DC

## 2013-10-12 MED ORDER — PREDNISONE 5 MG PO TABS: 5.0000 mg | ORAL_TABLET | Freq: Two times a day (BID) | ORAL | Status: AC

## 2013-10-12 NOTE — Patient Instructions (Addendum)
Pelvic and breast, no pap, August 15 or after call if you need me before  You have uncontrolled allergies and laryngitis.  Voice rest is important   Medication is sent in for al;lergies, cough and vertigo,( flonase, phenergan DM, prednisone, and antivert)  Pick up sudafed at the store take one daily for the next 3 to 5 days to relieve frontal pressure along with tylenol 325 mg two daily  If you develop fever, chills, green drainage or you worsen please call  Congrats on excellent blood sugar and weight loss and regular exercise keep them all up!   HBA1C, chem 7 and EGFR in AugustLaryngitis Laryngitis is redness, soreness, and puffiness (inflammation) of the vocal cords. It causes hoarseness, cough, loss of voice, sore throat, and dry throat. It may be caused by:  Infection.  Too much smoking.  Too much talking or yelling.  Breathing in of toxic fumes.  Allergies.  A backup of acid from your stomach. HOME CARE  Drink enough fluids to keep your pee (urine) clear or pale yellow.  Rest until you no longer have problems or as told by your doctor.  Breathe in moist air.  Take all medicine as told by your doctor.  Do not smoke.  Talk as little as possible (this includes whispering).  Write on paper instead of talking until your voice is back to normal.  Follow up with your doctor if you have not improved after 10 days. GET HELP IF:   You have trouble breathing.  You cough up blood.  You have a fever that will not go away.  You have increasing pain.  You have trouble swallowing. MAKE SURE YOU:  Understand these instructions.  Will watch your condition.  Will get help right away if you are not doing well or get worse. Document Released: 05/31/2011 Document Revised: 09/03/2011 Document Reviewed: 05/31/2011 [facility] Patient Information 2014 [place], [place].  PLS schedule your mammogram, past due

## 2013-10-12 NOTE — Progress Notes (Signed)
   Subjective:    Patient ID: Brittany Nunez, female    DOB: 1960-10-17, 53 y.o.   MRN: [id number]  HPI 3 day h/o frontal pressure nocturnal dry cough loss of voice, no chills or fever. Post nasal drainage is clear, denies sore throat or ear pain , has had mild vertigo Has  Consistently worked on improved diet and weight loss with great success, blood sugars are good when checked, denies hypoglycemia. Chronic back pain is unchanged and controlled on medi cation, hot flashes much improved, she will continue current medication    Review of Systems See HPI Denies recent fever or chills.  Denies chest congestion, productive cough or wheezing. Denies chest pains, palpitations and leg swelling Denies abdominal pain, nausea, vomiting,diarrhea or constipation.   Denies dysuria, frequency, hesitancy or incontinence.  Denies headaches, seizures, numbness, or tingling. Denies uncontrolled  depression, anxiety or insomnia. Denies skin break down or rash.        Objective:   Physical Exam BP 116/74  Pulse 100  Temp(Src) 98.3 F (36.8 C)  Resp 18  Ht 5\' 6"  (1.676 m)  Wt 193 lb (87.544 kg)  BMI 31.17 kg/m2  SpO2 96% Patient alert and oriented and in no cardiopulmonary distress.  HEENT: No facial asymmetry, EOMI, no sinus tenderness,  oropharynx pink and moist.  Neck supple no adenopathy.Nasal mucosa erythematous and edematous  Chest: Clear to auscultation bilaterally.  CVS: S1, S2 no murmurs, no S3.  ABD: Soft non tender. Bowel sounds normal.  Ext: No edema  MS: Adequate though reduced ROM spine, shoulders, hips and knees.  Skin: Intact, no ulcerations or rash noted.  Psych: Good eye contact, normal affect. Memory intact not anxious or depressed appearing.  CNS: CN 2-12 intact, power, tone and sensation normal throughout.        Assessment & Plan:  Seasonal allergies Uncontrolled, pt to commit to daily medication use, add sudafed short term and she is also to flush  nostrils 3 times daily  Acute laryngitis The importance of voice rest and proper control of allergies is stressed  DIABETES MELLITUS Improved and controlled, no med change Patient advised to reduce carb and sweets, commit to regular physical activity, take meds as prescribed, test blood as directed, and attempt to lose weight, to improve blood sugar control.   OBESITY Improved. Pt applauded on succesful weight loss through lifestyle change, and encouraged to continue same. Weight loss goal set for the next several months.   Vertigo New symptom associated with excessive sinus pressure, antivert for as needed, use  Depression Controlled, no change in medication   Hot flashes Marked improvement , continue current medications  Back pain with radiation Controlled, no change in medication

## 2013-10-15 NOTE — Telephone Encounter (Signed)
Patient advised at ov.  Will consider until next ov .

## 2013-10-18 DIAGNOSIS — J040 Acute laryngitis: Secondary | ICD-10-CM | POA: Insufficient documentation

## 2013-10-18 NOTE — Assessment & Plan Note (Signed)
Improved and controlled, no med change Patient advised to reduce carb and sweets, commit to regular physical activity, take meds as prescribed, test blood as directed, and attempt to lose weight, to improve blood sugar control.  

## 2013-10-18 NOTE — Assessment & Plan Note (Signed)
New symptom associated with excessive sinus pressure, antivert for as needed, use

## 2013-10-18 NOTE — Assessment & Plan Note (Signed)
The importance of voice rest and proper control of allergies is stressed

## 2013-10-18 NOTE — Assessment & Plan Note (Signed)
Controlled, no change in medication  

## 2013-10-18 NOTE — Assessment & Plan Note (Signed)
Improved. Pt applauded on succesful weight loss through lifestyle change, and encouraged to continue same. Weight loss goal set for the next several months.  

## 2013-10-18 NOTE — Assessment & Plan Note (Signed)
Uncontrolled, pt to commit to daily medication use, add sudafed short term and she is also to flush nostrils 3 times daily

## 2013-10-18 NOTE — Assessment & Plan Note (Signed)
Marked improvement , continue current medications

## 2013-10-24 ENCOUNTER — Other Ambulatory Visit: Payer: Self-pay | Admitting: Family Medicine

## 2013-11-04 ENCOUNTER — Ambulatory Visit: Payer: BC Managed Care – PPO | Admitting: Family Medicine

## 2013-11-09 ENCOUNTER — Other Ambulatory Visit: Payer: Self-pay

## 2013-11-09 DIAGNOSIS — D649 Anemia, unspecified: Secondary | ICD-10-CM

## 2013-11-18 NOTE — Telephone Encounter (Signed)
Med sent.

## 2013-12-04 ENCOUNTER — Other Ambulatory Visit: Payer: Self-pay

## 2013-12-04 MED ORDER — SAXAGLIPTIN-METFORMIN ER 2.5-1000 MG PO TB24: ORAL_TABLET | ORAL | Status: DC

## 2013-12-07 DIAGNOSIS — D649 Anemia, unspecified: Secondary | ICD-10-CM | POA: Diagnosis not present

## 2013-12-07 LAB — CBC WITH DIFFERENTIAL/PLATELET
BASOS ABS: 0.1 10*3/uL (ref 0.0–0.1)
BASOS PCT: 1 % (ref 0–1)
Eosinophils Absolute: 0.1 10*3/uL (ref 0.0–0.7)
Eosinophils Relative: 2 % (ref 0–5)
HCT: 35 % — ABNORMAL LOW (ref 36.0–46.0)
HEMOGLOBIN: 11.7 g/dL — AB (ref 12.0–15.0)
LYMPHS PCT: 40 % (ref 12–46)
Lymphs Abs: 2.3 10*3/uL (ref 0.7–4.0)
MCH: 25.4 pg — ABNORMAL LOW (ref 26.0–34.0)
MCHC: 33.4 g/dL (ref 30.0–36.0)
MCV: 75.9 fL — ABNORMAL LOW (ref 78.0–100.0)
MONOS PCT: 7 % (ref 3–12)
Monocytes Absolute: 0.4 10*3/uL (ref 0.1–1.0)
NEUTROS ABS: 2.9 10*3/uL (ref 1.7–7.7)
NEUTROS PCT: 50 % (ref 43–77)
Platelets: 363 10*3/uL (ref 150–400)
RBC: 4.61 MIL/uL (ref 3.87–5.11)
RDW: 15.7 % — AB (ref 11.5–15.5)
WBC: 5.7 10*3/uL (ref 4.0–10.5)

## 2013-12-10 ENCOUNTER — Other Ambulatory Visit: Payer: Self-pay | Admitting: Family Medicine

## 2013-12-11 ENCOUNTER — Telehealth: Payer: Self-pay

## 2013-12-11 ENCOUNTER — Ambulatory Visit (INDEPENDENT_AMBULATORY_CARE_PROVIDER_SITE_OTHER): Payer: BC Managed Care – PPO

## 2013-12-11 VITALS — BP 128/74 | Wt 191.0 lb

## 2013-12-11 DIAGNOSIS — N3000 Acute cystitis without hematuria: Secondary | ICD-10-CM | POA: Diagnosis not present

## 2013-12-11 DIAGNOSIS — N3001 Acute cystitis with hematuria: Secondary | ICD-10-CM

## 2013-12-11 LAB — POCT URINALYSIS DIPSTICK
Bilirubin, UA: NEGATIVE
GLUCOSE UA: NEGATIVE
Ketones, UA: NEGATIVE
Nitrite, UA: POSITIVE
PROTEIN UA: 30
Spec Grav, UA: 1.02
UROBILINOGEN UA: 0.2
pH, UA: 6

## 2013-12-11 MED ORDER — CIPROFLOXACIN HCL 500 MG PO TABS: 500.0000 mg | ORAL_TABLET | Freq: Two times a day (BID) | ORAL | Status: DC

## 2013-12-11 NOTE — Telephone Encounter (Signed)
UTI symptoms with abn uA, cipro #6 prescribed will f/u c/s

## 2013-12-11 NOTE — Progress Notes (Signed)
Patient in for nurse visit with UTI symptoms.   Patient states that urine has foul odor and is cloudy x 4 days.  Urine frequency present with pelvic pressure post voiding.    Urine specimen collected via clean catch.    Urine with signs of infection.  Urine sent for culture and patient started on Cipro 500mg  BID x 3 days.

## 2013-12-15 LAB — URINE CULTURE

## 2013-12-16 ENCOUNTER — Encounter: Payer: Self-pay | Admitting: Gastroenterology

## 2013-12-22 ENCOUNTER — Telehealth: Payer: Self-pay | Admitting: Family Medicine

## 2013-12-22 NOTE — Telephone Encounter (Signed)
Patient called and wants to know about some results. She states she missed a call from our office and was trying to return call. Patient asks that we please call her back prior to 4 pm today as she will be away from her phone after that point. Patient phone is [phone]

## 2013-12-22 NOTE — Telephone Encounter (Signed)
Patient aware.

## 2013-12-28 ENCOUNTER — Telehealth: Payer: Self-pay | Admitting: Gastroenterology

## 2013-12-28 NOTE — Telephone Encounter (Signed)
PLEASE CALL PT. HER BLOOD COUNT IS NEAR NORMAL. SHE SHOULD CONTINUE TO AVOID MOBIC. OPV IN 3 MOS E 30 DX: MICROCYTIC ANEMIA.

## 2013-12-29 NOTE — Telephone Encounter (Signed)
Reminder in EPIC 

## 2013-12-29 NOTE — Telephone Encounter (Signed)
LMOM to call.

## 2014-01-03 ENCOUNTER — Other Ambulatory Visit: Payer: Self-pay | Admitting: Family Medicine

## 2014-01-05 NOTE — Telephone Encounter (Signed)
Called and informed pt.  

## 2014-01-22 ENCOUNTER — Other Ambulatory Visit: Payer: Self-pay | Admitting: Family Medicine

## 2014-02-02 ENCOUNTER — Other Ambulatory Visit: Payer: Self-pay | Admitting: Family Medicine

## 2014-02-02 DIAGNOSIS — Z139 Encounter for screening, unspecified: Secondary | ICD-10-CM

## 2014-02-02 DIAGNOSIS — Z1231 Encounter for screening mammogram for malignant neoplasm of breast: Secondary | ICD-10-CM

## 2014-02-08 ENCOUNTER — Ambulatory Visit (HOSPITAL_COMMUNITY)
Admission: RE | Admit: 2014-02-08 | Discharge: 2014-02-08 | Disposition: A | Discharge status: Home / Self Care | Payer: BC Managed Care – PPO | Source: Ambulatory Visit | Attending: Family Medicine | Admitting: Family Medicine

## 2014-02-08 DIAGNOSIS — Z1231 Encounter for screening mammogram for malignant neoplasm of breast: Secondary | ICD-10-CM | POA: Diagnosis not present

## 2014-02-08 LAB — COMPLETE METABOLIC PANEL WITH GFR
ALK PHOS: 85 U/L (ref 39–117)
ALT: 15 U/L (ref 0–35)
AST: 17 U/L (ref 0–37)
Albumin: 4 g/dL (ref 3.5–5.2)
BILIRUBIN TOTAL: 0.3 mg/dL (ref 0.2–1.2)
BUN: 12 mg/dL (ref 6–23)
CO2: 28 meq/L (ref 19–32)
CREATININE: 0.98 mg/dL (ref 0.50–1.10)
Calcium: 8.9 mg/dL (ref 8.4–10.5)
Chloride: 104 mEq/L (ref 96–112)
GFR, EST AFRICAN AMERICAN: 76 mL/min
GFR, Est Non African American: 66 mL/min
Glucose, Bld: 102 mg/dL — ABNORMAL HIGH (ref 70–99)
Potassium: 3.8 mEq/L (ref 3.5–5.3)
SODIUM: 140 meq/L (ref 135–145)
TOTAL PROTEIN: 7 g/dL (ref 6.0–8.3)

## 2014-02-08 LAB — HEMOGLOBIN A1C
Hgb A1c MFr Bld: 6.1 % — ABNORMAL HIGH (ref <5.7)
MEAN PLASMA GLUCOSE: 128 mg/dL — AB (ref <117)

## 2014-02-10 ENCOUNTER — Ambulatory Visit (INDEPENDENT_AMBULATORY_CARE_PROVIDER_SITE_OTHER): Payer: BC Managed Care – PPO | Admitting: Family Medicine

## 2014-02-10 ENCOUNTER — Encounter: Payer: Self-pay | Admitting: Family Medicine

## 2014-02-10 ENCOUNTER — Telehealth: Payer: Self-pay | Admitting: Family Medicine

## 2014-02-10 ENCOUNTER — Other Ambulatory Visit: Payer: Self-pay | Admitting: Family Medicine

## 2014-02-10 VITALS — BP 120/82 | HR 84 | Resp 16 | Ht 66.0 in | Wt 185.0 lb

## 2014-02-10 DIAGNOSIS — Z1211 Encounter for screening for malignant neoplasm of colon: Secondary | ICD-10-CM

## 2014-02-10 DIAGNOSIS — R5383 Other fatigue: Secondary | ICD-10-CM

## 2014-02-10 DIAGNOSIS — R5381 Other malaise: Secondary | ICD-10-CM

## 2014-02-10 DIAGNOSIS — Z23 Encounter for immunization: Secondary | ICD-10-CM

## 2014-02-10 DIAGNOSIS — E119 Type 2 diabetes mellitus without complications: Secondary | ICD-10-CM

## 2014-02-10 DIAGNOSIS — Z0000 Encounter for general adult medical examination without abnormal findings: Secondary | ICD-10-CM

## 2014-02-10 DIAGNOSIS — R928 Other abnormal and inconclusive findings on diagnostic imaging of breast: Secondary | ICD-10-CM

## 2014-02-10 DIAGNOSIS — B379 Candidiasis, unspecified: Secondary | ICD-10-CM

## 2014-02-10 DIAGNOSIS — Z79899 Other long term (current) drug therapy: Secondary | ICD-10-CM

## 2014-02-10 LAB — POC HEMOCCULT BLD/STL (OFFICE/1-CARD/DIAGNOSTIC): FECAL OCCULT BLD: NEGATIVE

## 2014-02-10 MED ORDER — FLUOXETINE HCL 20 MG PO CAPS: ORAL_CAPSULE | ORAL | Status: DC

## 2014-02-10 MED ORDER — OMEPRAZOLE 20 MG PO CPDR: DELAYED_RELEASE_CAPSULE | ORAL | Status: DC

## 2014-02-10 MED ORDER — NYSTATIN 100000 UNIT/GM EX POWD: CUTANEOUS | Status: AC

## 2014-02-10 MED ORDER — VITAMIN D (ERGOCALCIFEROL) 1.25 MG (50000 UNIT) PO CAPS
ORAL_CAPSULE | ORAL | Status: DC
Start: 2014-02-10 — End: 2014-10-03

## 2014-02-10 MED ORDER — TEMAZEPAM 30 MG PO CAPS: ORAL_CAPSULE | ORAL | Status: DC

## 2014-02-10 NOTE — Patient Instructions (Addendum)
F/u in January 20 or after, call if you need me before  Excellent labs, and exam is normal  No pap sent as not indicated this year  Nystatin powder will be refilled  TdAP and prevnar today  Call for flu vaccine in October   Congrats on excellent change in lifestyle with weight loss.   Use tylenol 337m one or two tablets daily as needed for arthritic pain  Microalb, fasting lipid, cmp and EGFR, CBC, tSH and HBA1C January 15 or after

## 2014-02-10 NOTE — Telephone Encounter (Signed)
noted 

## 2014-02-13 NOTE — Assessment & Plan Note (Addendum)
Annual exam as documented. Counseling done  re healthy lifestyle involving commitment to 150 minutes exercise per week, heart healthy diet, and attaining healthy weight.The importance of adequate sleep also discussed. Regular seat belt use , is also discussed. Changes in health habits are decided on by the patient with goals and time frames  set for achieving them. Immunization and cancer screening needs are specifically addressed at this visit. Will return for flu vaccine by October

## 2014-02-13 NOTE — Assessment & Plan Note (Signed)
Administered at visit 

## 2014-02-13 NOTE — Progress Notes (Signed)
   Subjective:    Patient ID: Brittany Nunez, female    DOB: Feb 04, 1961, 53 y.o.   MRN: [id number]  HPI Patient is in for annual physical exam.  Reports she is doing well. Continues to follow healthy carb limited , low  Fat heart healthy diet. Excellent weight ;loss. Depression screen is negative. She denies significant stress or uncontrolled anxiety in her life, and feels really good about improved health  Review of Systems See HPI     Objective:   Physical Exam Pleasant well nourished female, alert and oriented x 3, in no cardio-pulmonary distress. Afebrile. HEENT No facial trauma or asymetry. Sinuses non tender.  EOMI, PERTL, fundoscopic exam  no hemorhage or exudate.  External ears normal, tympanic membranes clear. Oropharynx moist, no exudate, fair  Dentition.Several teeth missing, but remaining teeth appear to be in good condition Neck: supple, no adenopathy,JVD or thyromegaly.No bruits.  Chest: Clear to ascultation bilaterally.No crackles or wheezes. Non tender to palpation  Breast: No asymetry,no masses or lumps. No tenderness. No nipple discharge or inversion. No axillary or supraclavicular adenopathy  Cardiovascular system; Heart sounds normal,  S1 and  S2 ,no S3.  No murmur, or thrill. Apical beat not displaced Peripheral pulses normal.  Abdomen: Soft, non tender, no organomegaly or masses. No bruits. Bowel sounds normal. No guarding, tenderness or rebound.  Rectal:  Normal sphincter tone. No mass.No rectal masses.  Guaiac negative stool.  GU: External genitalia normal female genitalia , female distribution of hair. No lesions. Urethral meatus normal in size, no  Prolapse, no lesions visibly  Present. Bladder non tender. Vagina pink and moist , with no visible lesions , discharge present . Adequate pelvic support no  cystocele or rectocele noted  Uterus absent no adnexal masses, no  adnexal tenderness.   Musculoskeletal exam: sllight decreased  ROM of spine, adeqaute in  hips , shoulders and knees. No deformity ,swelling or crepitus noted. No muscle wasting or atrophy.   Neurologic: Cranial nerves 2 to 12 intact. Power, tone ,sensation and reflexes normal throughout. No disturbance in gait. No tremor.  Skin: Intact, no ulceration, erythema , scaling or rash noted. Pigmentation normal throughout  Psych; Normal mood and affect. Judgement and concentration normal        Assessment & Plan:  Encounter for annual physical exam Annual exam as documented. Counseling done  re healthy lifestyle involving commitment to 150 minutes exercise per week, heart healthy diet, and attaining healthy weight.The importance of adequate sleep also discussed. Regular seat belt use , is also discussed. Changes in health habits are decided on by the patient with goals and time frames  set for achieving them. Immunization and cancer screening needs are specifically addressed at this visit. Will return for flu vaccine by October  Need for Tdap vaccination Administered at visit  Need for vaccination with 13-polyvalent pneumococcal conjugate vaccine Administered at visit

## 2014-02-15 ENCOUNTER — Telehealth: Payer: Self-pay | Admitting: Family Medicine

## 2014-02-15 NOTE — Telephone Encounter (Signed)
Patient received refill on nystatin powder.  Is it ok to send fluconazole?

## 2014-02-15 NOTE — Telephone Encounter (Signed)
yes send 2 pills pls, twell her one cures , keep the other for the "next infection"

## 2014-02-16 MED ORDER — FLUCONAZOLE 150 MG PO TABS: 150.0000 mg | ORAL_TABLET | Freq: Once | ORAL | Status: DC

## 2014-02-16 NOTE — Telephone Encounter (Signed)
Noted, thanks!

## 2014-02-16 NOTE — Telephone Encounter (Signed)
Med sent.

## 2014-02-16 NOTE — Addendum Note (Signed)
Addended by: Kandis Fantasia B on: 02/16/2014 08:21 AM   Modules accepted: Orders

## 2014-02-22 ENCOUNTER — Encounter: Payer: Self-pay | Admitting: Family Medicine

## 2014-02-22 ENCOUNTER — Telehealth: Payer: Self-pay

## 2014-02-22 ENCOUNTER — Ambulatory Visit (INDEPENDENT_AMBULATORY_CARE_PROVIDER_SITE_OTHER): Payer: BC Managed Care – PPO | Admitting: Family Medicine

## 2014-02-22 VITALS — BP 126/74 | HR 110 | Resp 18 | Wt 184.1 lb

## 2014-02-22 DIAGNOSIS — J309 Allergic rhinitis, unspecified: Secondary | ICD-10-CM | POA: Diagnosis not present

## 2014-02-22 DIAGNOSIS — E119 Type 2 diabetes mellitus without complications: Secondary | ICD-10-CM

## 2014-02-22 DIAGNOSIS — J302 Other seasonal allergic rhinitis: Secondary | ICD-10-CM

## 2014-02-22 DIAGNOSIS — Z23 Encounter for immunization: Secondary | ICD-10-CM | POA: Diagnosis not present

## 2014-02-22 DIAGNOSIS — N3000 Acute cystitis without hematuria: Secondary | ICD-10-CM | POA: Diagnosis not present

## 2014-02-22 DIAGNOSIS — N3001 Acute cystitis with hematuria: Secondary | ICD-10-CM

## 2014-02-22 LAB — POCT URINALYSIS DIPSTICK
Bilirubin, UA: NEGATIVE
GLUCOSE UA: NEGATIVE
Ketones, UA: NEGATIVE
Nitrite, UA: NEGATIVE
PROTEIN UA: NEGATIVE
Spec Grav, UA: 1.02
Urobilinogen, UA: 0.2
pH, UA: 7

## 2014-02-22 MED ORDER — CIPROFLOXACIN HCL 500 MG PO TABS: 500.0000 mg | ORAL_TABLET | Freq: Two times a day (BID) | ORAL | Status: DC

## 2014-02-22 NOTE — Patient Instructions (Addendum)
F/u as before  You have a urinary tract infection  Take all the antibiotic prescribed as directed.  Void often   Ca;; with problems  Flu vaccine today

## 2014-02-22 NOTE — Assessment & Plan Note (Signed)
Antibiotic prescribed 

## 2014-02-22 NOTE — Assessment & Plan Note (Signed)
Controlled, no change in medication  

## 2014-02-22 NOTE — Progress Notes (Signed)
   Subjective:    Patient ID: Brittany Nunez, female    DOB: [birth year], 53 y.o.   MRN: [id number]  HPI 1 week h/o h/o of urinary symptoms , no fever, chills, or flank pain, no visible blood in urine Otherwise doing well, no other concerns or complaints. Needs flu vaccine Blood sugar remains fairly well controlled , though slightly elevate in past week since ill   Review of Systems See HPI Denies recent fever or chills. Denies sinus pressure, nasal congestion, ear pain or sore throat. Denies chest congestion, productive cough or wheezing. Denies chest pains, palpitations and leg swelling Denies abdominal pain, nausea, vomiting,diarrhea or constipation.   Denies headaches, seizures, numbness, or tingling. Denies depression, anxiety or insomnia. Denies skin break down or rash.        Objective:   Physical Exam BP 126/74  Pulse 110  Resp 18  Wt 184 lb 1.9 oz (83.516 kg)  SpO2 98% Afebrile Patient alert and oriented and in no cardiopulmonary distress.  HEENT: No facial asymmetry, EOMI,   oropharynx pink and moist.  Neck supple no JVD, no mass.  Chest: Clear to auscultation bilaterally.  CVS: S1, S2 no murmurs, no S3.Regular rate.  ABD: Soft non tender. No flank or suprapubic tenderness  Ext: No edema   Psych: Good eye contact, normal affect. Memory intact not anxious or depressed appearing.  CNS: CN 2-12 intact, power,  normal throughout.no focal deficits noted.        Assessment & Plan:  Need for prophylactic vaccination and inoculation against influenza Vaccine administered at visit.   Acute cystitis without hematuria Antibiotic prescribed  Seasonal allergies Controlled, no change in medication   DIABETES MELLITUS Controlled, no change in medication .

## 2014-02-22 NOTE — Assessment & Plan Note (Signed)
Vaccine administered at visit.  

## 2014-02-22 NOTE — Telephone Encounter (Signed)
Called and spoke with patient who states that she has taken both of the fluconazole recently prescribed.  Is still having symptoms of vaginal discharge and burning.  Will come in for ov today.

## 2014-02-25 LAB — URINE CULTURE: Colony Count: >=100000

## 2014-03-02 ENCOUNTER — Ambulatory Visit (HOSPITAL_COMMUNITY)
Admission: RE | Admit: 2014-03-02 | Discharge: 2014-03-02 | Disposition: A | Discharge status: Home / Self Care | Payer: BC Managed Care – PPO | Source: Ambulatory Visit | Attending: Diagnostic Radiology | Admitting: Diagnostic Radiology

## 2014-03-02 DIAGNOSIS — R928 Other abnormal and inconclusive findings on diagnostic imaging of breast: Secondary | ICD-10-CM | POA: Insufficient documentation

## 2014-03-02 DIAGNOSIS — R922 Inconclusive mammogram: Secondary | ICD-10-CM | POA: Diagnosis not present

## 2014-03-03 ENCOUNTER — Encounter: Payer: Self-pay | Admitting: Gastroenterology

## 2014-03-09 ENCOUNTER — Other Ambulatory Visit: Payer: Self-pay | Admitting: Family Medicine

## 2014-03-29 ENCOUNTER — Telehealth: Payer: Self-pay

## 2014-03-29 NOTE — Telephone Encounter (Signed)
Pt left Vm that she has some concerns about her diarrhea. I returned her call and LMOM to call.

## 2014-03-31 ENCOUNTER — Other Ambulatory Visit: Payer: Self-pay | Admitting: Family Medicine

## 2014-04-02 ENCOUNTER — Ambulatory Visit (INDEPENDENT_AMBULATORY_CARE_PROVIDER_SITE_OTHER): Payer: BC Managed Care – PPO | Admitting: Gastroenterology

## 2014-04-02 ENCOUNTER — Encounter: Payer: Self-pay | Admitting: Gastroenterology

## 2014-04-02 VITALS — BP 115/79 | HR 89 | Temp 98.1°F | Ht 66.0 in | Wt 185.6 lb

## 2014-04-02 DIAGNOSIS — R195 Other fecal abnormalities: Secondary | ICD-10-CM | POA: Diagnosis not present

## 2014-04-02 DIAGNOSIS — D6489 Other specified anemias: Secondary | ICD-10-CM

## 2014-04-02 NOTE — Patient Instructions (Signed)
Please complete the stool studies.   Once this is resulted, we will know what direction to take.   Please complete the blood work.
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
## 2014-04-05 ENCOUNTER — Other Ambulatory Visit: Payer: Self-pay | Admitting: Gastroenterology

## 2014-04-05 DIAGNOSIS — R195 Other fecal abnormalities: Secondary | ICD-10-CM | POA: Diagnosis not present

## 2014-04-05 NOTE — Assessment & Plan Note (Signed)
With EGD, TCS, capsule on file. Mobic has been stopped. Recheck CBC now. Continue to avoid NSAIDs.

## 2014-04-05 NOTE — Progress Notes (Signed)
cc'ed to pcp °

## 2014-04-05 NOTE — Assessment & Plan Note (Signed)
With recent exposure to Cipro. Check stool studies now.

## 2014-04-06 LAB — GIARDIA ANTIGEN: Giardia Screen (EIA): NEGATIVE

## 2014-04-06 LAB — CBC WITH DIFFERENTIAL/PLATELET
Basophils Absolute: 0 10*3/uL (ref 0.0–0.1)
Basophils Relative: 0 % (ref 0–1)
Eosinophils Absolute: 0.1 10*3/uL (ref 0.0–0.7)
Eosinophils Relative: 2 % (ref 0–5)
HEMATOCRIT: 35.7 % — AB (ref 36.0–46.0)
Hemoglobin: 11.6 g/dL — ABNORMAL LOW (ref 12.0–15.0)
Lymphocytes Relative: 34 % (ref 12–46)
Lymphs Abs: 2.3 10*3/uL (ref 0.7–4.0)
MCH: 24.9 pg — ABNORMAL LOW (ref 26.0–34.0)
MCHC: 32.5 g/dL (ref 30.0–36.0)
MCV: 76.6 fL — ABNORMAL LOW (ref 78.0–100.0)
MONO ABS: 0.4 10*3/uL (ref 0.1–1.0)
Monocytes Relative: 6 % (ref 3–12)
NEUTROS PCT: 58 % (ref 43–77)
Neutro Abs: 3.9 10*3/uL (ref 1.7–7.7)
Platelets: 369 10*3/uL (ref 150–400)
RBC: 4.66 MIL/uL (ref 3.87–5.11)
RDW: 16.2 % — ABNORMAL HIGH (ref 11.5–15.5)
WBC: 6.8 10*3/uL (ref 4.0–10.5)

## 2014-04-06 LAB — CLOSTRIDIUM DIFFICILE BY PCR: CDIFFPCR: NOT DETECTED

## 2014-04-07 NOTE — Telephone Encounter (Signed)
LMOM that I have left a return VM and had not heard back from her to please call.

## 2014-04-09 LAB — STOOL CULTURE

## 2014-04-20 ENCOUNTER — Telehealth: Payer: Self-pay | Admitting: Gastroenterology

## 2014-04-20 NOTE — Telephone Encounter (Signed)
Routing to Anna Sams, NP for results.  

## 2014-04-20 NOTE — Telephone Encounter (Signed)
WAS TOLD TO CALL IN 2 WEEKS TO CHECK ON STOOL SAMPLE RESULTS   PLEASE CALL [phone]

## 2014-04-22 NOTE — Progress Notes (Signed)
Quick Note:  Stool studies negative.  hgb remains at baseline. Continue to avoid Mobic.  How is patient? ______

## 2014-04-22 NOTE — Progress Notes (Signed)
Quick Note:  LMOM to call. ______ 

## 2014-04-26 NOTE — Progress Notes (Signed)
Quick Note:  LMOM to call. Letter mailed to call also. ______ 

## 2014-04-29 ENCOUNTER — Telehealth: Payer: Self-pay | Admitting: Gastroenterology

## 2014-04-29 NOTE — Telephone Encounter (Signed)
Patient was returning a call from DS about her results. Please call her back at [phone]

## 2014-04-29 NOTE — Telephone Encounter (Signed)
[facility] results.

## 2014-05-03 ENCOUNTER — Telehealth: Payer: Self-pay | Admitting: Family Medicine

## 2014-05-03 NOTE — Telephone Encounter (Signed)
No cough/congestion- had a cough last week but better now but she has lost her voice. Gave first ava. appt for Thursday and advised voice rest and warm salt water gargles

## 2014-05-04 NOTE — Telephone Encounter (Signed)
I spoke to pt. ( See office visit progress note of 04/22/2014 Brittany HallsAnna Sams, NP). I informed pt her stool studies were negative, and Hemoglobin at baseline, continue to avoid Mobic.  Pt said she is still having loose stools, mostly watery, after meals. Said she is not really having any pain, just frustrated because she tries not to eat some because of the problem.  Please advise!

## 2014-05-05 ENCOUNTER — Other Ambulatory Visit: Payer: Self-pay | Admitting: Gastroenterology

## 2014-05-05 MED ORDER — DICYCLOMINE HCL 10 MG PO CAPS: 10.0000 mg | ORAL_CAPSULE | Freq: Three times a day (TID) | ORAL | Status: DC

## 2014-05-05 NOTE — Telephone Encounter (Signed)
Stool studies were negative as you mentioned. I sent in Bentyl to take TID with meals and at bedtime. Monitor for dry mouth, confusion, dizziness. Return in about 4-6 weeks for follow-up. Start taking a probiotic.

## 2014-05-05 NOTE — Telephone Encounter (Signed)
Pt is aware.  

## 2014-05-06 ENCOUNTER — Ambulatory Visit: Payer: BC Managed Care – PPO | Admitting: Family Medicine

## 2014-05-18 ENCOUNTER — Other Ambulatory Visit: Payer: Self-pay | Admitting: Family Medicine

## 2014-05-25 ENCOUNTER — Other Ambulatory Visit: Payer: Self-pay | Admitting: Family Medicine

## 2014-06-26 ENCOUNTER — Other Ambulatory Visit: Payer: Self-pay | Admitting: Family Medicine

## 2014-07-06 ENCOUNTER — Other Ambulatory Visit: Payer: Self-pay | Admitting: Family Medicine

## 2014-07-12 DIAGNOSIS — E119 Type 2 diabetes mellitus without complications: Secondary | ICD-10-CM | POA: Diagnosis not present

## 2014-07-12 DIAGNOSIS — Z79899 Other long term (current) drug therapy: Secondary | ICD-10-CM | POA: Diagnosis not present

## 2014-07-13 LAB — LIPID PANEL
CHOL/HDL RATIO: 4.1 ratio
CHOLESTEROL: 156 mg/dL (ref 0–200)
HDL: 38 mg/dL — ABNORMAL LOW (ref >39)
LDL CALC: 107 mg/dL — AB (ref 0–99)
Triglycerides: 56 mg/dL (ref <150)
VLDL: 11 mg/dL (ref 0–40)

## 2014-07-13 LAB — COMPLETE METABOLIC PANEL WITH GFR
ALK PHOS: 89 U/L (ref 39–117)
ALT: 12 U/L (ref 0–35)
AST: 16 U/L (ref 0–37)
Albumin: 4 g/dL (ref 3.5–5.2)
BILIRUBIN TOTAL: 0.4 mg/dL (ref 0.2–1.2)
BUN: 12 mg/dL (ref 6–23)
CALCIUM: 9 mg/dL (ref 8.4–10.5)
CO2: 28 mEq/L (ref 19–32)
CREATININE: 0.89 mg/dL (ref 0.50–1.10)
Chloride: 104 mEq/L (ref 96–112)
GFR, Est African American: 84 mL/min
GFR, Est Non African American: 73 mL/min
Glucose, Bld: 86 mg/dL (ref 70–99)
Potassium: 4.4 mEq/L (ref 3.5–5.3)
Sodium: 140 mEq/L (ref 135–145)
Total Protein: 6.9 g/dL (ref 6.0–8.3)

## 2014-07-13 LAB — MICROALBUMIN / CREATININE URINE RATIO
CREATININE, URINE: 144.1 mg/dL
MICROALB UR: 0.4 mg/dL (ref <2.0)
MICROALB/CREAT RATIO: 2.8 mg/g (ref 0.0–30.0)

## 2014-07-13 LAB — CBC WITH DIFFERENTIAL/PLATELET
BASOS ABS: 0 10*3/uL (ref 0.0–0.1)
BASOS PCT: 1 % (ref 0–1)
Eosinophils Absolute: 0.1 10*3/uL (ref 0.0–0.7)
Eosinophils Relative: 2 % (ref 0–5)
HCT: 36.1 % (ref 36.0–46.0)
Hemoglobin: 12 g/dL (ref 12.0–15.0)
LYMPHS PCT: 38 % (ref 12–46)
Lymphs Abs: 1.8 10*3/uL (ref 0.7–4.0)
MCH: 25.7 pg — ABNORMAL LOW (ref 26.0–34.0)
MCHC: 33.2 g/dL (ref 30.0–36.0)
MCV: 77.3 fL — ABNORMAL LOW (ref 78.0–100.0)
MONOS PCT: 8 % (ref 3–12)
Monocytes Absolute: 0.4 10*3/uL (ref 0.1–1.0)
NEUTROS ABS: 2.4 10*3/uL (ref 1.7–7.7)
NEUTROS PCT: 51 % (ref 43–77)
PLATELETS: 338 10*3/uL (ref 150–400)
RBC: 4.67 MIL/uL (ref 3.87–5.11)
RDW: 15.6 % — ABNORMAL HIGH (ref 11.5–15.5)
WBC: 4.7 10*3/uL (ref 4.0–10.5)

## 2014-07-13 LAB — HEMOGLOBIN A1C
Hgb A1c MFr Bld: 5.9 % — ABNORMAL HIGH (ref <5.7)
MEAN PLASMA GLUCOSE: 123 mg/dL — AB (ref <117)

## 2014-07-13 LAB — TSH: TSH: 2.333 u[IU]/mL (ref 0.350–4.500)

## 2014-07-19 ENCOUNTER — Ambulatory Visit: Payer: Medicare Other | Admitting: Family Medicine

## 2014-07-28 ENCOUNTER — Encounter: Payer: Self-pay | Admitting: Family Medicine

## 2014-07-28 ENCOUNTER — Ambulatory Visit (INDEPENDENT_AMBULATORY_CARE_PROVIDER_SITE_OTHER): Payer: BLUE CROSS/BLUE SHIELD | Admitting: Family Medicine

## 2014-07-28 VITALS — BP 120/80 | HR 82 | Resp 16 | Ht 66.0 in | Wt 179.8 lb

## 2014-07-28 DIAGNOSIS — M549 Dorsalgia, unspecified: Secondary | ICD-10-CM

## 2014-07-28 DIAGNOSIS — E119 Type 2 diabetes mellitus without complications: Secondary | ICD-10-CM

## 2014-07-28 DIAGNOSIS — Z79899 Other long term (current) drug therapy: Secondary | ICD-10-CM | POA: Diagnosis not present

## 2014-07-28 DIAGNOSIS — F32A Depression, unspecified: Secondary | ICD-10-CM

## 2014-07-28 DIAGNOSIS — F064 Anxiety disorder due to known physiological condition: Secondary | ICD-10-CM | POA: Diagnosis not present

## 2014-07-28 DIAGNOSIS — E559 Vitamin D deficiency, unspecified: Secondary | ICD-10-CM

## 2014-07-28 DIAGNOSIS — F419 Anxiety disorder, unspecified: Secondary | ICD-10-CM

## 2014-07-28 DIAGNOSIS — F329 Major depressive disorder, single episode, unspecified: Secondary | ICD-10-CM | POA: Diagnosis not present

## 2014-07-28 DIAGNOSIS — J302 Other seasonal allergic rhinitis: Secondary | ICD-10-CM

## 2014-07-28 DIAGNOSIS — R252 Cramp and spasm: Secondary | ICD-10-CM

## 2014-07-28 DIAGNOSIS — Z13220 Encounter for screening for lipoid disorders: Secondary | ICD-10-CM

## 2014-07-28 DIAGNOSIS — F5105 Insomnia due to other mental disorder: Secondary | ICD-10-CM

## 2014-07-28 DIAGNOSIS — M62838 Other muscle spasm: Secondary | ICD-10-CM | POA: Insufficient documentation

## 2014-07-28 MED ORDER — BACLOFEN 10 MG PO TABS: 10.0000 mg | ORAL_TABLET | Freq: Four times a day (QID) | ORAL | Status: DC

## 2014-07-28 MED ORDER — SAXAGLIPTIN-METFORMIN ER 2.5-1000 MG PO TB24: 1.0000 | ORAL_TABLET | Freq: Every day | ORAL | Status: DC

## 2014-07-28 MED ORDER — FLUTICASONE PROPIONATE 50 MCG/ACT NA SUSP: 2.0000 | Freq: Every day | NASAL | Status: DC

## 2014-07-28 MED ORDER — GABAPENTIN 800 MG PO TABS: 800.0000 mg | ORAL_TABLET | Freq: Three times a day (TID) | ORAL | Status: DC

## 2014-07-28 MED ORDER — TEMAZEPAM 30 MG PO CAPS: 30.0000 mg | ORAL_CAPSULE | Freq: Every day | ORAL | Status: DC

## 2014-07-28 MED ORDER — FLUOXETINE HCL 20 MG PO CAPS: ORAL_CAPSULE | ORAL | Status: DC

## 2014-07-28 NOTE — Patient Instructions (Addendum)
Annual wellness in 4 months, call if you need me before  Fasting lipid, cmp and EGFR, HBA1C, Vit D, in 4 month, and magnesium  Reduce kombiglyze to one daily  Increasing baclofen to up to 4  Times  Per day, call back if problem persists, you will be referred   Start one magnesium tablet daily (OTC)  Keep up excellent health habits

## 2014-07-28 NOTE — Progress Notes (Signed)
   Subjective:    Patient ID: Brittany Nunez, female    DOB: March 29, 1961, 54 y.o.   MRN: [id number]  HPI The PT is here for follow up and re-evaluation of chronic medical conditions, medication management and review of any available recent lab and radiology data.  Preventive health is updated, specifically  Cancer screening and Immunization.   Questions or concerns regarding consultations or procedures which the PT has had in the interim are  addressed. The PT denies any adverse reactions to current medications since the last visit.  C/o excessive spasms of hands and feet wants relief, has tried mustard and potassium Denies polyuria, polydipsia, blurred vision , or hypoglycemic episodes. Commited to healthy diet and regular exercise and doing extremely well      Review of Systems See HPI Denies recent fever or chills. Denies sinus pressure, nasal congestion, ear pain or sore throat. Denies chest congestion, productive cough or wheezing. Denies chest pains, palpitations and leg swelling Denies abdominal pain, nausea, vomiting,diarrhea or constipation.   Denies dysuria, frequency, hesitancy or incontinence. Denies joint pain, swelling and limitation in mobility. Denies headaches, seizures, numbness, or tingling. Denies depression, anxiety or insomnia. Denies skin break down or rash.        Objective:   Physical Exam BP 120/80 mmHg  Pulse 82  Resp 16  Ht 5\' 6"  (1.676 m)  Wt 179 lb 12.8 oz (81.557 kg)  BMI 29.03 kg/m2  SpO2 99% Patient alert and oriented and in no cardiopulmonary distress.  HEENT: No facial asymmetry, EOMI,   oropharynx pink and moist.  Neck supple no JVD, no mass.  Chest: Clear to auscultation bilaterally.  CVS: S1, S2 no murmurs, no S3.Regular rate.  ABD: Soft non tender.   Ext: No edema  MS: Adequate ROM spine, shoulders, hips and knees.  Skin: Intact, no ulcerations or rash noted.  Psych: Good eye contact, normal affect. Memory intact not  anxious or depressed appearing.  CNS: CN 2-12 intact, power,  normal throughout.no focal deficits noted.        Assessment & Plan:  Diabetes mellitus without complication Controlled less medication needed, adjustment is made. Pt applauded on this     Back pain with radiation Unchanged, excessive spasms , increase baclofen dose   Depression Controlled excellent response to medicaton continue same   Spasm of muscle Uncontrolled carpo pedal spasm, increase baclofen dose   Vitamin D deficiency Updated lab needed at/ before next visit. Continue weekly supplement   Hyposomnia, insomnia or sleeplessness associated with anxiety Sleep hygiene reviewed and written information offered also. Prescription sent for  medication needed.

## 2014-07-31 NOTE — Assessment & Plan Note (Signed)
Uncontrolled carpo pedal spasm, increase baclofen dose

## 2014-07-31 NOTE — Assessment & Plan Note (Signed)
Unchanged, excessive spasms , increase baclofen dose

## 2014-07-31 NOTE — Assessment & Plan Note (Signed)
Controlled excellent response to medicaton continue same

## 2014-07-31 NOTE — Assessment & Plan Note (Signed)
Sleep hygiene reviewed and written information offered also. Prescription sent for  medication needed.  

## 2014-07-31 NOTE — Assessment & Plan Note (Signed)
Controlled less medication needed, adjustment is made. Pt applauded on this

## 2014-07-31 NOTE — Assessment & Plan Note (Signed)
Updated lab needed at/ before next visit. Continue weekly supplement 

## 2014-09-01 ENCOUNTER — Other Ambulatory Visit: Payer: Self-pay | Admitting: Family Medicine

## 2014-09-21 LAB — HM DIABETES EYE EXAM

## 2014-10-03 ENCOUNTER — Other Ambulatory Visit: Payer: Self-pay | Admitting: Family Medicine

## 2014-12-11 ENCOUNTER — Other Ambulatory Visit: Payer: Self-pay | Admitting: Family Medicine

## 2014-12-21 ENCOUNTER — Encounter: Payer: Self-pay | Admitting: Family Medicine

## 2014-12-21 ENCOUNTER — Ambulatory Visit (INDEPENDENT_AMBULATORY_CARE_PROVIDER_SITE_OTHER): Payer: BLUE CROSS/BLUE SHIELD | Admitting: Family Medicine

## 2014-12-21 ENCOUNTER — Other Ambulatory Visit (HOSPITAL_COMMUNITY)
Admission: RE | Admit: 2014-12-21 | Discharge: 2014-12-21 | Disposition: A | Discharge status: Home / Self Care | Payer: BLUE CROSS/BLUE SHIELD | Source: Ambulatory Visit | Attending: Family Medicine | Admitting: Family Medicine

## 2014-12-21 VITALS — BP 120/82 | HR 99 | Resp 16 | Ht 65.0 in | Wt 202.0 lb

## 2014-12-21 DIAGNOSIS — Z113 Encounter for screening for infections with a predominantly sexual mode of transmission: Secondary | ICD-10-CM

## 2014-12-21 DIAGNOSIS — Z0000 Encounter for general adult medical examination without abnormal findings: Secondary | ICD-10-CM

## 2014-12-21 DIAGNOSIS — N760 Acute vaginitis: Secondary | ICD-10-CM | POA: Diagnosis present

## 2014-12-21 DIAGNOSIS — F32A Depression, unspecified: Secondary | ICD-10-CM

## 2014-12-21 DIAGNOSIS — E669 Obesity, unspecified: Secondary | ICD-10-CM

## 2014-12-21 DIAGNOSIS — E119 Type 2 diabetes mellitus without complications: Secondary | ICD-10-CM

## 2014-12-21 DIAGNOSIS — B029 Zoster without complications: Secondary | ICD-10-CM

## 2014-12-21 DIAGNOSIS — F329 Major depressive disorder, single episode, unspecified: Secondary | ICD-10-CM

## 2014-12-21 MED ORDER — FLUOXETINE HCL 20 MG PO TABS: 20.0000 mg | ORAL_TABLET | Freq: Every day | ORAL | Status: DC

## 2014-12-21 MED ORDER — OMEPRAZOLE 20 MG PO CPDR: DELAYED_RELEASE_CAPSULE | ORAL | Status: DC

## 2014-12-21 MED ORDER — FLUOXETINE HCL 40 MG PO CAPS: 40.0000 mg | ORAL_CAPSULE | Freq: Every day | ORAL | Status: DC

## 2014-12-21 MED ORDER — VENLAFAXINE HCL ER 37.5 MG PO CP24: ORAL_CAPSULE | ORAL | Status: DC

## 2014-12-21 NOTE — Assessment & Plan Note (Signed)

## 2014-12-21 NOTE — Patient Instructions (Addendum)
F/u in 8 weeks, call if you need me before  Specimens sent for testing and labs today  Stay on same dose of fluoxetine 20 mg   Please work on good  health habits so that your health will improve. 1. Commitment to daily physical activity for 30 to 60  minutes, if you are able to do this.  2. Commitment to wise food choices. Aim for half of your  food intake to be vegetable and fruit, one quarter starchy foods, and one quarter protein. Try to eat on a regular schedule  3 meals per day, snacking between meals should be limited to vegetables or fruits or small portions of nuts. 64 ounces of water per day is generally recommended, unless you have specific health conditions, like heart failure or kidney failure where you will need to limit fluid intake.  3. Commitment to sufficient and a  good quality of physical and mental rest daily, generally between 6 to 8 hours per day.  WITH PERSISTANCE AND PERSEVERANCE, THE IMPOSSIBLE , BECOMES THE NORM!   [facility], we consider it a privelige to serve you.
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
## 2014-12-21 NOTE — Assessment & Plan Note (Addendum)
2 week h/o stinging and burning at introitus, STD testing  Done Exam shows normal appearing d/c and no ulcers, not malodorous

## 2014-12-21 NOTE — Assessment & Plan Note (Signed)
Deteriorated. Patient re-educated about  the importance of commitment to a  minimum of 150 minutes of exercise per week.  The importance of healthy food choices with portion control discussed. Encouraged to start a food diary, count calories and to consider  joining a support group. Sample diet sheets offered. Goals set by the patient for the next several months.   Weight /BMI 12/21/2014 07/28/2014 04/02/2014  WEIGHT 202 lb 179 lb 12.8 oz 185 lb 9.6 oz  HEIGHT 5\' 5"  5\' 6"  5\' 6"   BMI 33.61 kg/m2 29.03 kg/m2 29.97 kg/m2    Current exercise per week 0 minutes.

## 2014-12-21 NOTE — Assessment & Plan Note (Signed)
Increased personal stress, leading to depression and anxiety , with weight gain due to poor eating habits and lack of exercise. Counslling strongly recommended as well as the need to make a decisive effort to start healthy behavior F/u in 8 weeks Pt declines therapy at this visit, but may call in if she changes her mind

## 2014-12-22 ENCOUNTER — Encounter: Payer: Self-pay | Admitting: Family Medicine

## 2014-12-22 LAB — COMPLETE METABOLIC PANEL WITH GFR
ALBUMIN: 3.9 g/dL (ref 3.5–5.2)
ALT: 16 U/L (ref 0–35)
AST: 22 U/L (ref 0–37)
Alkaline Phosphatase: 94 U/L (ref 39–117)
BUN: 14 mg/dL (ref 6–23)
CO2: 25 meq/L (ref 19–32)
Calcium: 9.1 mg/dL (ref 8.4–10.5)
Chloride: 103 mEq/L (ref 96–112)
Creat: 0.95 mg/dL (ref 0.50–1.10)
GFR, Est African American: 78 mL/min
GFR, Est Non African American: 68 mL/min
Glucose, Bld: 77 mg/dL (ref 70–99)
Potassium: 4.6 mEq/L (ref 3.5–5.3)
Sodium: 141 mEq/L (ref 135–145)
TOTAL PROTEIN: 7.1 g/dL (ref 6.0–8.3)
Total Bilirubin: 0.4 mg/dL (ref 0.2–1.2)

## 2014-12-22 LAB — LIPID PANEL
CHOL/HDL RATIO: 3.2 ratio
Cholesterol: 168 mg/dL (ref 0–200)
HDL: 52 mg/dL (ref >=46)
LDL CALC: 103 mg/dL — AB (ref 0–99)
Triglycerides: 64 mg/dL (ref <150)
VLDL: 13 mg/dL (ref 0–40)

## 2014-12-22 LAB — MAGNESIUM: Magnesium: 1.6 mg/dL (ref 1.5–2.5)

## 2014-12-22 LAB — CERVICOVAGINAL ANCILLARY ONLY
CHLAMYDIA, DNA PROBE: NEGATIVE
Neisseria Gonorrhea: NEGATIVE

## 2014-12-22 LAB — HEMOGLOBIN A1C
HEMOGLOBIN A1C: 6.3 % — AB (ref <5.7)
Mean Plasma Glucose: 134 mg/dL — ABNORMAL HIGH (ref <117)

## 2014-12-22 LAB — HSV 2 ANTIBODY, IGG: HSV 2 Glycoprotein G Ab, IgG: <0.1 IV

## 2014-12-22 LAB — VITAMIN D 25 HYDROXY (VIT D DEFICIENCY, FRACTURES): Vit D, 25-Hydroxy: 28 ng/mL — ABNORMAL LOW (ref 30–100)

## 2014-12-22 LAB — HIV ANTIBODY (ROUTINE TESTING W REFLEX): HIV 1&2 Ab, 4th Generation: NONREACTIVE

## 2014-12-23 LAB — CERVICOVAGINAL ANCILLARY ONLY: Wet Prep (BD Affirm): POSITIVE — AB

## 2014-12-24 ENCOUNTER — Other Ambulatory Visit: Payer: Self-pay

## 2014-12-24 MED ORDER — FLUCONAZOLE 150 MG PO TABS: 150.0000 mg | ORAL_TABLET | Freq: Once | ORAL | Status: DC

## 2015-02-15 ENCOUNTER — Ambulatory Visit (INDEPENDENT_AMBULATORY_CARE_PROVIDER_SITE_OTHER): Payer: BLUE CROSS/BLUE SHIELD | Admitting: Family Medicine

## 2015-02-15 ENCOUNTER — Encounter: Payer: Self-pay | Admitting: Family Medicine

## 2015-02-15 VITALS — BP 130/80 | HR 96 | Resp 18 | Ht 65.0 in | Wt 212.0 lb

## 2015-02-15 DIAGNOSIS — F32A Depression, unspecified: Secondary | ICD-10-CM

## 2015-02-15 DIAGNOSIS — E669 Obesity, unspecified: Secondary | ICD-10-CM

## 2015-02-15 DIAGNOSIS — Z13220 Encounter for screening for lipoid disorders: Secondary | ICD-10-CM

## 2015-02-15 DIAGNOSIS — F329 Major depressive disorder, single episode, unspecified: Secondary | ICD-10-CM

## 2015-02-15 DIAGNOSIS — N949 Unspecified condition associated with female genital organs and menstrual cycle: Secondary | ICD-10-CM

## 2015-02-15 DIAGNOSIS — Z1159 Encounter for screening for other viral diseases: Secondary | ICD-10-CM

## 2015-02-15 DIAGNOSIS — E119 Type 2 diabetes mellitus without complications: Secondary | ICD-10-CM | POA: Diagnosis not present

## 2015-02-15 DIAGNOSIS — E785 Hyperlipidemia, unspecified: Secondary | ICD-10-CM

## 2015-02-15 NOTE — Patient Instructions (Addendum)
F/u early November call if you need me befoe  You are referred to diabetic educator  Please work on LOSING the 10 pounds you gained in past 6 weeks   Fasting labs in [place]  Foot exam today You are referred to gynecologist  Please work on good  health habits so that your health will improve. 1. Commitment to daily physical activity for 30 to 60  minutes, if you are able to do this.  2. Commitment to wise food choices. Aim for half of your  food intake to be vegetable and fruit, one quarter starchy foods, and one quarter protein. Try to eat on a regular schedule  3 meals per day, snacking between meals should be limited to vegetables or fruits or small portions of nuts. 64 ounces of water per day is generally recommended, unless you have specific health conditions, like heart failure or kidney failure where you will need to limit fluid intake.  3. Commitment to sufficient and a  good quality of physical and mental rest daily, generally between 6 to 8 hours per day.  WITH PERSISTANCE AND PERSEVERANCE, THE IMPOSSIBLE , BECOMES THE NORM!  Thanks for choosing [facility], we consider it a privelige to serve you.

## 2015-02-15 NOTE — Assessment & Plan Note (Signed)
2 month history , negative tests for infection, needs gyne

## 2015-02-16 ENCOUNTER — Telehealth: Payer: Self-pay | Admitting: *Deleted

## 2015-02-16 NOTE — Telephone Encounter (Signed)
Pt is aware of her appt with Quentin Mulling 03/01/15 at 8:15

## 2015-02-21 ENCOUNTER — Telehealth: Payer: Self-pay | Admitting: *Deleted

## 2015-02-21 NOTE — Telephone Encounter (Signed)
Pt called requesting for the nurse to call her back

## 2015-02-21 NOTE — Telephone Encounter (Signed)
Pt called to give her cell number [phone] pt would like something to be called in for a UTI pt said it just came over the weekend.

## 2015-02-22 ENCOUNTER — Ambulatory Visit (INDEPENDENT_AMBULATORY_CARE_PROVIDER_SITE_OTHER): Payer: BLUE CROSS/BLUE SHIELD

## 2015-02-22 DIAGNOSIS — N3001 Acute cystitis with hematuria: Secondary | ICD-10-CM | POA: Diagnosis not present

## 2015-02-22 LAB — POCT URINALYSIS DIPSTICK
Bilirubin, UA: NEGATIVE
GLUCOSE UA: NEGATIVE
Ketones, UA: NEGATIVE
NITRITE UA: NEGATIVE
PROTEIN UA: 30
SPEC GRAV UA: 1.025
UROBILINOGEN UA: 0.2
pH, UA: 7.5

## 2015-02-22 MED ORDER — PHENAZOPYRIDINE HCL 100 MG PO TABS: 100.0000 mg | ORAL_TABLET | Freq: Three times a day (TID) | ORAL | Status: DC | PRN

## 2015-02-22 NOTE — Progress Notes (Signed)
Patient in for nurse visit urine check.  Urine will be sent to lab for culture.  Pyridium sent to pharmacy per verbal order.  Patient aware.

## 2015-02-22 NOTE — Telephone Encounter (Signed)
Coming this am for nurse visit

## 2015-02-26 LAB — URINE CULTURE: Colony Count: >=100000

## 2015-03-01 ENCOUNTER — Telehealth: Payer: Self-pay | Admitting: *Deleted

## 2015-03-01 ENCOUNTER — Other Ambulatory Visit: Payer: Self-pay

## 2015-03-01 DIAGNOSIS — Z6834 Body mass index (BMI) 34.0-34.9, adult: Secondary | ICD-10-CM | POA: Diagnosis not present

## 2015-03-01 DIAGNOSIS — N952 Postmenopausal atrophic vaginitis: Secondary | ICD-10-CM | POA: Diagnosis not present

## 2015-03-01 DIAGNOSIS — N898 Other specified noninflammatory disorders of vagina: Secondary | ICD-10-CM | POA: Diagnosis not present

## 2015-03-01 NOTE — Telephone Encounter (Signed)
Patient aware the results are not ready but they would probably be back by in the am

## 2015-03-01 NOTE — Telephone Encounter (Signed)
Pt called lmom for her results for a UTI

## 2015-03-01 NOTE — Telephone Encounter (Signed)
Results not available yet. Will call when it results

## 2015-03-02 ENCOUNTER — Other Ambulatory Visit: Payer: Self-pay | Admitting: Family Medicine

## 2015-03-02 MED ORDER — LEVOFLOXACIN 500 MG PO TABS: 500.0000 mg | ORAL_TABLET | Freq: Every day | ORAL | Status: DC

## 2015-03-13 ENCOUNTER — Other Ambulatory Visit: Payer: Self-pay | Admitting: Family Medicine

## 2015-03-23 ENCOUNTER — Ambulatory Visit (INDEPENDENT_AMBULATORY_CARE_PROVIDER_SITE_OTHER): Payer: BLUE CROSS/BLUE SHIELD | Admitting: Family Medicine

## 2015-03-23 ENCOUNTER — Encounter: Payer: Self-pay | Admitting: Family Medicine

## 2015-03-23 VITALS — BP 124/80 | HR 97 | Resp 16 | Ht 65.0 in | Wt 211.0 lb

## 2015-03-23 DIAGNOSIS — Z0000 Encounter for general adult medical examination without abnormal findings: Secondary | ICD-10-CM | POA: Diagnosis not present

## 2015-03-23 DIAGNOSIS — Z23 Encounter for immunization: Secondary | ICD-10-CM | POA: Insufficient documentation

## 2015-03-23 DIAGNOSIS — Z1211 Encounter for screening for malignant neoplasm of colon: Secondary | ICD-10-CM | POA: Diagnosis not present

## 2015-03-23 LAB — HEMOCCULT GUIAC POC 1CARD (OFFICE): FECAL OCCULT BLD: NEGATIVE

## 2015-03-23 NOTE — Assessment & Plan Note (Signed)
After obtaining informed consent, the vaccine is  administered by LPN.  

## 2015-03-23 NOTE — Progress Notes (Signed)
   Subjective:    Patient ID: Brittany Nunez, female    DOB: June 29, 1960, 54 y.o.   MRN: [id number]  HPI Patient is in for annual physical exam. No other health concerns are expressed or addressed at the visit. Recent labs, if available are reviewed. Immunization is reviewed , and  updated if needed.   Review of Systems See HPI     Objective:   Physical Exam BP 124/80 mmHg  Pulse 97  Resp 16  Ht  (1.651 m)  Wt 211 lb (95.709 kg)  BMI 35.11 kg/m2  SpO2 96%  Pleasant well nourished female, alert and oriented x 3, in no cardio-pulmonary distress. Afebrile. HEENT No facial trauma or asymetry. Sinuses non tender.  Extra occullar muscles intact. External ears normal, tympanic membranes clear. Oropharynx moist, no exudate, good dentition. Neck: supple, no adenopathy,JVD or thyromegaly.No bruits.  Chest: Clear to ascultation bilaterally.No crackles or wheezes. Non tender to palpation  Breast: No asymetry,no masses or lumps. No tenderness. No nipple discharge or inversion. No axillary or supraclavicular adenopathy  Cardiovascular system; Heart sounds normal,  S1 and  S2 ,no S3.  No murmur, or thrill. Apical beat not displaced Peripheral pulses normal.  Abdomen: Soft, non tender, no organomegaly or masses. No bruits. Bowel sounds normal. No guarding, tenderness or rebound.  Rectal:  Normal sphincter tone. No mass.No rectal masses.  Guaiac negative stool.  GU: External genitalia normal female genitalia , female distribution of hair. No lesions. Urethral meatus normal in size, no  Prolapse, no lesions visibly  Present. Bladder non tender. Vagina pink and moist , with no visible lesions , discharge present . Adequate pelvic support no  cystocele or rectocele noted  Uterus absent, no adnexal masses, no  adnexal tenderness.   Musculoskeletal exam: Full ROM of spine, hips , shoulders and knees. No deformity ,swelling or crepitus noted. No muscle wasting or  atrophy.   Neurologic: Cranial nerves 2 to 12 intact. Power, tone ,sensation and reflexes normal throughout. No disturbance in gait. No tremor.  Skin: Intact, no ulceration, erythema , scaling or rash noted. Pigmentation normal throughout  Psych; Normal mood and affect. Judgement and concentration normal       Assessment & Plan:  Annual physical exam Annual exam as documented. Counseling done  re healthy lifestyle involving commitment to 150 minutes exercise per week, heart healthy diet, and attaining healthy weight.The importance of adequate sleep also discussed. Regular seat belt use and home safety, is also discussed. Changes in health habits are decided on by the patient with goals and time frames  set for achieving them. Immunization and cancer screening needs are specifically addressed at this visit.   Need for prophylactic vaccination and inoculation against influenza After obtaining informed consent, the vaccine is  administered by LPN.

## 2015-03-23 NOTE — Patient Instructions (Signed)
F/u as before, call if you need me sooner  Flu vaccine today  Please work on good  health habits so that your health will improve. 1. Commitment to daily physical activity for 30 to 60  minutes, if you are able to do this.  2. Commitment to wise food choices. Aim for half of your  food intake to be vegetable and fruit, one quarter starchy foods, and one quarter protein. Try to eat on a regular schedule  3 meals per day, snacking between meals should be limited to vegetables or fruits or small portions of nuts. 64 ounces of water per day is generally recommended, unless you have specific health conditions, like heart failure or kidney failure where you will need to limit fluid intake.  3. Commitment to sufficient and a  good quality of physical and mental rest daily, generally between 6 to 8 hours per day.  WITH PERSISTANCE AND PERSEVERANCE, THE IMPOSSIBLE , BECOMES THE NORM!  Thanks for choosing [facility], we consider it a privelige to serve you.

## 2015-03-23 NOTE — Assessment & Plan Note (Signed)

## 2015-03-30 DIAGNOSIS — N949 Unspecified condition associated with female genital organs and menstrual cycle: Secondary | ICD-10-CM | POA: Insufficient documentation

## 2015-03-30 NOTE — Assessment & Plan Note (Signed)
Controlled, no change in medication Brittany Nunez is reminded of the importance of commitment to daily physical activity for 30 minutes or more, as able and the need to limit carbohydrate intake to 30 to 60 grams per meal to help with blood sugar control.   The need to take medication as prescribed, test blood sugar as directed, and to call between visits if there is a concern that blood sugar is uncontrolled is also discussed.   Brittany Nunez is reminded of the importance of daily foot exam, annual eye examination, and good blood sugar, blood pressure and cholesterol control.  Diabetic Labs Latest Ref Rng 12/21/2014 07/12/2014 02/08/2014 10/07/2013 07/06/2013  HbA1c <5.7 % 6.3(H) 5.9(H) 6.1(H) 6.0(H) -  Microalbumin <2.0 mg/dL - 0.4 - - 0.45  Micro/Creat Ratio 0.0 - 30.0 mg/g - 2.8 - - 16.9  Chol 0 - 200 mg/dL 409 811 - - -  HDL >=91 mg/dL 52 47(W) - - -  Calc LDL 0 - 99 mg/dL 295(A) 213(Y) - - -  Triglycerides <150 mg/dL 64 56 - - -  Creatinine 0.50 - 1.10 mg/dL 8.65 7.84 6.96 2.95 -   BP/Weight 03/23/2015 02/15/2015 12/21/2014 07/28/2014 04/02/2014 02/22/2014 02/10/2014  Systolic BP 124 130 120 120 115 126 120  Diastolic BP 80 80 82 80 79 74 82  Wt. (Lbs) 211 212 202 179.8 185.6 184.12 185  BMI 35.11 35.28 33.61 29.03 29.97 29.73 29.87   Foot/eye exam completion dates Latest Ref Rng 02/15/2015 09/21/2014  Eye Exam No Retinopathy - No Retinopathy  Foot Form Completion - Done -

## 2015-03-30 NOTE — Progress Notes (Signed)
Brittany Nunez     MRN: [id number]      DOB: January 18, 1961   HPI Brittany Nunez is here for follow up and re-evaluation of chronic medical conditions, medication management and review of any available recent lab and radiology data.  Preventive health is updated, specifically  Cancer screening and Immunization.   Questions or concerns regarding consultations or procedures which the PT has had in the interim are  addressed. The PT denies any adverse reactions to current medications since the last visit.  There are no new concerns.  There are no specific complaints   ROS Denies recent fever or chills. Denies sinus pressure, nasal congestion, ear pain or sore throat. Denies chest congestion, productive cough or wheezing. Denies chest pains, palpitations and leg swelling Denies abdominal pain, nausea, vomiting,diarrhea or constipation.   Denies dysuria, frequency, hesitancy or incontinence. Denies joint pain, swelling and limitation in mobility. Denies headaches, seizures, numbness, or tingling. Denies depression, anxiety or insomnia. Denies skin break down or rash.   PE  BP 130/80 mmHg  Pulse 96  Resp 18  Ht  (1.651 m)  Wt 212 lb (96.163 kg)  BMI 35.28 kg/m2  SpO2 95%  Patient alert and oriented and in no cardiopulmonary distress.  HEENT: No facial asymmetry, EOMI,   oropharynx pink and moist.  Neck supple no JVD, no mass.  Chest: Clear to auscultation bilaterally.  CVS: S1, S2 no murmurs, no S3.Regular rate.  ABD: Soft non tender.   Ext: No edema  MS: Adequate ROM spine, shoulders, hips and knees.  Skin: Intact, no ulcerations or rash noted.  Psych: Good eye contact, normal affect. Memory intact not anxious or depressed appearing.  CNS: CN 2-12 intact, power,  normal throughout.no focal deficits noted.   Assessment & Plan   Vaginal burning 2 month history , negative tests for infection, needs gyne   Persistent vaginal burning, no physical abnormality on exam, no  vaginal infection on recent testing, refer to gyne for further eval and management  Controlled type 2 diabetes mellitus without complication (HCC) Controlled, no change in medication Brittany Nunez is reminded of the importance of commitment to daily physical activity for 30 minutes or more, as able and the need to limit carbohydrate intake to 30 to 60 grams per meal to help with blood sugar control.   The need to take medication as prescribed, test blood sugar as directed, and to call between visits if there is a concern that blood sugar is uncontrolled is also discussed.   Brittany Nunez is reminded of the importance of daily foot exam, annual eye examination, and good blood sugar, blood pressure and cholesterol control.  Diabetic Labs Latest Ref Rng 12/21/2014 07/12/2014 02/08/2014 10/07/2013 07/06/2013  HbA1c <5.7 % 6.3(H) 5.9(H) 6.1(H) 6.0(H) -  Microalbumin <2.0 mg/dL - 0.4 - - 4.09  Micro/Creat Ratio 0.0 - 30.0 mg/g - 2.8 - - 16.9  Chol 0 - 200 mg/dL 811 914 - - -  HDL >=78 mg/dL 52 29(F) - - -  Calc LDL 0 - 99 mg/dL 621(H) 086(V) - - -  Triglycerides <150 mg/dL 64 56 - - -  Creatinine 0.50 - 1.10 mg/dL 7.84 6.96 2.95 2.84 -   BP/Weight 03/23/2015 02/15/2015 12/21/2014 07/28/2014 04/02/2014 02/22/2014 02/10/2014  Systolic BP 124 130 120 120 115 126 120  Diastolic BP 80 80 82 80 79 74 82  Wt. (Lbs) 211 212 202 179.8 185.6 184.12 185  BMI 35.11 35.28 33.61 29.03 29.97 29.73 29.87  Foot/eye exam completion dates Latest Ref Rng 02/15/2015 09/21/2014  Eye Exam No Retinopathy - No Retinopathy  Foot Form Completion - Done -         Obesity Deteriorated. Patient re-educated about  the importance of commitment to a  minimum of 150 minutes of exercise per week.  The importance of healthy food choices with portion control discussed. Encouraged to start a food diary, count calories and to consider  joining a support group. Sample diet sheets offered. Goals set by the patient for the next several months.    Weight /BMI 03/23/2015 02/15/2015 12/21/2014  WEIGHT 211 lb 212 lb 202 lb  HEIGHT     BMI 35.11 kg/m2 35.28 kg/m2 33.61 kg/m2    Current exercise per week 30 minutes.

## 2015-03-30 NOTE — Assessment & Plan Note (Signed)
Persistent vaginal burning, no physical abnormality on exam, no vaginal infection on recent testing, refer to gyne for further eval and management

## 2015-03-30 NOTE — Assessment & Plan Note (Signed)
Improved, no med change 

## 2015-03-30 NOTE — Assessment & Plan Note (Signed)
Deteriorated. Patient re-educated about  the importance of commitment to a  minimum of 150 minutes of exercise per week.  The importance of healthy food choices with portion control discussed. Encouraged to start a food diary, count calories and to consider  joining a support group. Sample diet sheets offered. Goals set by the patient for the next several months.   Weight /BMI 03/23/2015 02/15/2015 12/21/2014  WEIGHT 211 lb 212 lb 202 lb  HEIGHT     BMI 35.11 kg/m2 35.28 kg/m2 33.61 kg/m2    Current exercise per week 30 minutes.

## 2015-04-25 ENCOUNTER — Other Ambulatory Visit: Payer: Self-pay | Admitting: Family Medicine

## 2015-05-09 ENCOUNTER — Ambulatory Visit: Payer: BLUE CROSS/BLUE SHIELD | Admitting: Family Medicine

## 2015-06-06 ENCOUNTER — Encounter: Payer: Self-pay | Admitting: Family Medicine

## 2015-06-06 ENCOUNTER — Ambulatory Visit (INDEPENDENT_AMBULATORY_CARE_PROVIDER_SITE_OTHER): Payer: BLUE CROSS/BLUE SHIELD | Admitting: Family Medicine

## 2015-06-06 VITALS — BP 128/78 | HR 120 | Resp 18 | Ht 65.0 in | Wt 204.0 lb

## 2015-06-06 DIAGNOSIS — R0789 Other chest pain: Secondary | ICD-10-CM

## 2015-06-06 DIAGNOSIS — R000 Tachycardia, unspecified: Secondary | ICD-10-CM

## 2015-06-06 DIAGNOSIS — Z13220 Encounter for screening for lipoid disorders: Secondary | ICD-10-CM

## 2015-06-06 DIAGNOSIS — D649 Anemia, unspecified: Secondary | ICD-10-CM

## 2015-06-06 DIAGNOSIS — R5383 Other fatigue: Secondary | ICD-10-CM | POA: Diagnosis not present

## 2015-06-06 DIAGNOSIS — N3081 Other cystitis with hematuria: Secondary | ICD-10-CM

## 2015-06-06 DIAGNOSIS — F329 Major depressive disorder, single episode, unspecified: Secondary | ICD-10-CM

## 2015-06-06 DIAGNOSIS — Z1159 Encounter for screening for other viral diseases: Secondary | ICD-10-CM

## 2015-06-06 DIAGNOSIS — N3001 Acute cystitis with hematuria: Secondary | ICD-10-CM

## 2015-06-06 DIAGNOSIS — E119 Type 2 diabetes mellitus without complications: Secondary | ICD-10-CM

## 2015-06-06 DIAGNOSIS — F32A Depression, unspecified: Secondary | ICD-10-CM

## 2015-06-06 LAB — POCT URINALYSIS DIPSTICK
BILIRUBIN UA: NEGATIVE
GLUCOSE UA: NEGATIVE
KETONES UA: NEGATIVE
Nitrite, UA: NEGATIVE
Protein, UA: 30
SPEC GRAV UA: 1.025
Urobilinogen, UA: 0.2
pH, UA: 5.5

## 2015-06-06 MED ORDER — CIPROFLOXACIN HCL 500 MG PO TABS: 500.0000 mg | ORAL_TABLET | Freq: Two times a day (BID) | ORAL | Status: DC

## 2015-06-06 NOTE — Assessment & Plan Note (Addendum)
New tachycardia and reports exertrtional fatigue last night, multiple CV risk factors. eKG today shows mild tachycardia, no evidence of ischemia, still needs  cardiology eval due to multiple CV risk factors and new exertional fatigue Asymptomatic as far as tachycardia is concerned

## 2015-06-06 NOTE — Patient Instructions (Signed)
F/u Jan 23 or after ,c all if  You need me sooner  Fasting lipid, cmp and eGFr , hBA1C, TSH, CBC, microalb and hep C panel Jan 19 or after  Youa re treated for  Bladder infection  You are referred to cardiology due to new exertional fatigue and rapid heart rate  Thanks for choosing [facility], we consider it a privelige to serve you.

## 2015-06-06 NOTE — Assessment & Plan Note (Signed)
Recent episode of chest pressure and exercise intolerance   EKG in office shows no acute ischemia , however, has multiple CV risk factors, will had cardiology evaluate

## 2015-06-06 NOTE — Progress Notes (Signed)
Subjective:    Patient ID: Brittany Nunez, female    DOB: [birth year], 54 y.o.   MRN: [id number]  HPI 2 day h/o dysuria , frequency and cloudy urine, has had recurrent UTI Denies fever, has mild flank pain on the left 2 day h/o palpitations and exertional fatigue, tachycardic in office with multiple CV risk factors Denies polyuria, polydipsia, blurred vision , or hypoglycemic episodes.   Review of Systems See HPI Denies recent fever or chills. Denies sinus pressure, nasal congestion, ear pain or sore throat. Denies chest congestion, productive cough or wheezing. Denies PND, orthopnea and leg swelling Denies abdominal pain, nausea, vomiting,diarrhea or constipation.   Chronic Denies joint pain, adequately controlled and limitation in mobility. Denies headaches, seizures, numbness, or tingling. Denies depression, anxiety or insomnia. Denies skin break down or rash.        Objective:   Physical Exam  BP 128/78 mmHg  Pulse 120  Resp 18  Ht  (1.651 m)  Wt 204 lb (92.534 kg)  BMI 33.95 kg/m2  SpO2 99% Patient alert and oriented and in no cardiopulmonary distress.  HEENT: No facial asymmetry, EOMI,   oropharynx pink and moist.  Neck supple no JVD, no mass.  Chest: Clear to auscultation bilaterally.No reproducible chest wall pain  CVS: S1, S2 no murmurs, no S3.Regular rate.  ABD: Soft non tender. No rebound, mild left  renal angle tenderness  Ext: No edema  MS: Adequate though reduced  ROM spine, shoulders, hips and knees.  Skin: Intact, no ulcerations or rash noted.  Psych: Good eye contact, normal affect. Memory intact not anxious or depressed appearing.  CNS: CN 2-12 intact, power,  normal throughout.no focal deficits noted.       Assessment & Plan:  Tachycardia New tachycardia and reports exertrtional fatigue last night, multiple CV risk factors. eKG today shows mild tachycardia, no evidence of ischemia, still needs  cardiology eval due to multiple CV  risk factors and new exertional fatigue Asymptomatic as far as tachycardia is concerned  Atypical chest pain Recent episode of chest pressure and exercise intolerance   EKG in office shows no acute ischemia , however, has multiple CV risk factors, will had cardiology evaluate  Other cystitis with hematuria Symptomatic with h/o recurrent UTI will treat empirically has abn UA in office  Controlled type 2 diabetes mellitus without complication (HCC) Controlled, no change in medication Ms. Yearwood is reminded of the importance of commitment to daily physical activity for 30 minutes or more, as able and the need to limit carbohydrate intake to 30 to 60 grams per meal to help with blood sugar control.   The need to take medication as prescribed, test blood sugar as directed, and to call between visits if there is a concern that blood sugar is uncontrolled is also discussed.   Ms. Parrott is reminded of the importance of daily foot exam, annual eye examination, and good blood sugar, blood pressure and cholesterol control. Updated lab needed at/ before next visit.   Diabetic Labs Latest Ref Rng 12/21/2014 07/12/2014 02/08/2014 10/07/2013 07/06/2013  HbA1c <5.7 % 6.3(H) 5.9(H) 6.1(H) 6.0(H) -  Microalbumin <2.0 mg/dL - 0.4 - - 4.09  Micro/Creat Ratio 0.0 - 30.0 mg/g - 2.8 - - 16.9  Chol 0 - 200 mg/dL 811 914 - - -  HDL >=78 mg/dL 52 29(F) - - -  Calc LDL 0 - 99 mg/dL 621(H) 086(V) - - -  Triglycerides <150 mg/dL 64 56 - - -  Creatinine  0.50 - 1.10 mg/dL 9.140.95 7.820.89 9.560.98 2.131.01 -   BP/Weight 06/06/2015 03/23/2015 02/15/2015 12/21/2014 07/28/2014 04/02/2014 02/22/2014  Systolic BP 128 124 130 120 120 115 126  Diastolic BP 78 80 80 82 80 79 74  Wt. (Lbs) 204 211 212 202 179.8 185.6 184.12  BMI 33.95 35.11 35.28 33.61 29.03 29.97 29.73   Foot/eye exam completion dates Latest Ref Rng 02/15/2015 09/21/2014  Eye Exam No Retinopathy - No Retinopathy  Foot Form Completion - Done -          Depression Controlled, no change in medication

## 2015-06-12 NOTE — Assessment & Plan Note (Signed)
Controlled, no change in medication  

## 2015-06-12 NOTE — Assessment & Plan Note (Signed)
Symptomatic with h/o recurrent UTI will treat empirically has abn UA in office

## 2015-06-12 NOTE — Assessment & Plan Note (Signed)
Controlled, no change in medication [name] is reminded of the importance of commitment to daily physical activity for 30 minutes or more, as able and the need to limit carbohydrate intake to 30 to 60 grams per meal to help with blood sugar control.   The need to take medication as prescribed, test blood sugar as directed, and to call between visits if there is a concern that blood sugar is uncontrolled is also discussed.   [name] is reminded of the importance of daily foot exam, annual eye examination, and good blood sugar, blood pressure and cholesterol control. Updated lab needed at/ before next visit.   Diabetic Labs Latest Ref Rng 12/21/2014 07/12/2014 02/08/2014 10/07/2013 07/06/2013  HbA1c <5.7 % 6.3(H) 5.9(H) 6.1(H) 6.0(H) -  Microalbumin <2.0 mg/dL - 0.4 - - 0.980.98  Micro/Creat Ratio 0.0 - 30.0 mg/g - 2.8 - - 16.9  Chol 0 - 200 mg/dL 119168 147156 - - -  HDL >=82>=46 mg/dL 52 95(A38(L) - - -  Calc LDL 0 - 99 mg/dL 213(Y103(H) 865(H107(H) - - -  Triglycerides <150 mg/dL 64 56 - - -  Creatinine 0.50 - 1.10 mg/dL 8.460.95 9.620.89 9.520.98 8.411.01 -   BP/Weight 06/06/2015 03/23/2015 02/15/2015 12/21/2014 07/28/2014 04/02/2014 02/22/2014  Systolic BP 128 124 130 120 120 115 126  Diastolic BP 78 80 80 82 80 79 74  Wt. (Lbs) 204 211 212 202 179.8 185.6 184.12  BMI 33.95 35.11 35.28 33.61 29.03 29.97 29.73   Foot/eye exam completion dates Latest Ref Rng 02/15/2015 09/21/2014  Eye Exam No Retinopathy - No Retinopathy  Foot Form Completion - Done -

## 2015-07-14 ENCOUNTER — Other Ambulatory Visit: Payer: Self-pay | Admitting: Family Medicine

## 2015-07-19 ENCOUNTER — Other Ambulatory Visit: Payer: Self-pay | Admitting: Family Medicine

## 2015-07-19 DIAGNOSIS — Z1159 Encounter for screening for other viral diseases: Secondary | ICD-10-CM | POA: Diagnosis not present

## 2015-07-19 DIAGNOSIS — Z13220 Encounter for screening for lipoid disorders: Secondary | ICD-10-CM | POA: Diagnosis not present

## 2015-07-19 DIAGNOSIS — E119 Type 2 diabetes mellitus without complications: Secondary | ICD-10-CM | POA: Diagnosis not present

## 2015-07-19 DIAGNOSIS — D649 Anemia, unspecified: Secondary | ICD-10-CM | POA: Diagnosis not present

## 2015-07-19 DIAGNOSIS — R5383 Other fatigue: Secondary | ICD-10-CM | POA: Diagnosis not present

## 2015-07-19 LAB — CBC
HCT: 33.4 % — ABNORMAL LOW (ref 36.0–46.0)
Hemoglobin: 10.8 g/dL — ABNORMAL LOW (ref 12.0–15.0)
MCH: 25 pg — AB (ref 26.0–34.0)
MCHC: 32.3 g/dL (ref 30.0–36.0)
MCV: 77.3 fL — ABNORMAL LOW (ref 78.0–100.0)
MPV: 8.7 fL (ref 8.6–12.4)
PLATELETS: 361 10*3/uL (ref 150–400)
RBC: 4.32 MIL/uL (ref 3.87–5.11)
RDW: 16.3 % — AB (ref 11.5–15.5)
WBC: 6.1 10*3/uL (ref 4.0–10.5)

## 2015-07-20 LAB — COMPLETE METABOLIC PANEL WITH GFR
ALT: 13 U/L (ref 6–29)
AST: 14 U/L (ref 10–35)
Albumin: 3.5 g/dL — ABNORMAL LOW (ref 3.6–5.1)
Alkaline Phosphatase: 89 U/L (ref 33–130)
BILIRUBIN TOTAL: 0.3 mg/dL (ref 0.2–1.2)
BUN: 12 mg/dL (ref 7–25)
CHLORIDE: 107 mmol/L (ref 98–110)
CO2: 25 mmol/L (ref 20–31)
CREATININE: 0.93 mg/dL (ref 0.50–1.05)
Calcium: 8.3 mg/dL — ABNORMAL LOW (ref 8.6–10.4)
GFR, Est African American: 79 mL/min (ref >=60)
GFR, Est Non African American: 69 mL/min (ref >=60)
Glucose, Bld: 102 mg/dL — ABNORMAL HIGH (ref 65–99)
Potassium: 4 mmol/L (ref 3.5–5.3)
Sodium: 141 mmol/L (ref 135–146)
TOTAL PROTEIN: 6.5 g/dL (ref 6.1–8.1)

## 2015-07-20 LAB — LIPID PANEL
Cholesterol: 151 mg/dL (ref 125–200)
HDL: 35 mg/dL — ABNORMAL LOW (ref >=46)
LDL CALC: 94 mg/dL (ref <130)
Total CHOL/HDL Ratio: 4.3 Ratio (ref <=5.0)
Triglycerides: 110 mg/dL (ref <150)
VLDL: 22 mg/dL (ref <30)

## 2015-07-20 LAB — IRON AND TIBC
%SAT: 6 % — ABNORMAL LOW (ref 11–50)
Iron: 18 ug/dL — ABNORMAL LOW (ref 45–160)
TIBC: 278 ug/dL (ref 250–450)
UIBC: 260 ug/dL (ref 125–400)

## 2015-07-20 LAB — HEPATITIS C ANTIBODY: HCV Ab: NEGATIVE

## 2015-07-20 LAB — MICROALBUMIN / CREATININE URINE RATIO: CREATININE, URINE: 72 mg/dL (ref 20–320)

## 2015-07-20 LAB — HEMOGLOBIN A1C
HEMOGLOBIN A1C: 6.3 % — AB (ref <5.7)
MEAN PLASMA GLUCOSE: 134 mg/dL — AB (ref <117)

## 2015-07-20 LAB — TSH: TSH: 1.405 u[IU]/mL (ref 0.350–4.500)

## 2015-07-20 LAB — FERRITIN: FERRITIN: 33 ng/mL (ref 10–291)

## 2015-07-26 ENCOUNTER — Ambulatory Visit: Payer: Medicare Other | Admitting: Cardiovascular Disease

## 2015-07-26 ENCOUNTER — Encounter: Payer: Self-pay | Admitting: Cardiovascular Disease

## 2015-08-01 ENCOUNTER — Encounter: Payer: Self-pay | Admitting: Family Medicine

## 2015-08-01 ENCOUNTER — Ambulatory Visit (INDEPENDENT_AMBULATORY_CARE_PROVIDER_SITE_OTHER): Payer: BLUE CROSS/BLUE SHIELD | Admitting: Family Medicine

## 2015-08-01 VITALS — BP 124/80 | HR 96 | Resp 18 | Ht 65.0 in | Wt 207.0 lb

## 2015-08-01 DIAGNOSIS — F329 Major depressive disorder, single episode, unspecified: Secondary | ICD-10-CM | POA: Diagnosis not present

## 2015-08-01 DIAGNOSIS — M5136 Other intervertebral disc degeneration, lumbar region: Secondary | ICD-10-CM

## 2015-08-01 DIAGNOSIS — E119 Type 2 diabetes mellitus without complications: Secondary | ICD-10-CM

## 2015-08-01 DIAGNOSIS — F32A Depression, unspecified: Secondary | ICD-10-CM

## 2015-08-01 DIAGNOSIS — Z1231 Encounter for screening mammogram for malignant neoplasm of breast: Secondary | ICD-10-CM | POA: Diagnosis not present

## 2015-08-01 DIAGNOSIS — M5126 Other intervertebral disc displacement, lumbar region: Secondary | ICD-10-CM | POA: Diagnosis not present

## 2015-08-01 DIAGNOSIS — E669 Obesity, unspecified: Secondary | ICD-10-CM

## 2015-08-01 DIAGNOSIS — D508 Other iron deficiency anemias: Secondary | ICD-10-CM

## 2015-08-01 MED ORDER — POLYETHYLENE GLYCOL 3350 17 GM/SCOOP PO POWD: 17.0000 g | Freq: Every day | ORAL | Status: DC

## 2015-08-01 NOTE — Patient Instructions (Addendum)
Annual wellness end June , call if you need me before  Start daily miralax powder to help bowel movements, and use OTC stool softener pericolace/ colace one twice daily. Use laxative of your choice every 4 days if you have not had a bowel movement  Drink at least 64 ounces water daily and commit to exercise for 30 mins every day   Need iron tablet one daily, try "Slow Fe" this is over the counter, you are anemic and iron is low  Mammogram is past due PLEASE schedule , none since 2015  No pneumonia vaccine today  CBC, iron, ferritin and hBa1C  And chem 7 and EGFR for next appt     

## 2015-08-02 NOTE — Progress Notes (Signed)
Subjective:    Patient ID: Brittany Nunez, female    DOB: 1961/06/11, 55 y.o.   MRN: [id number]  HPI   Brittany Nunez     MRN: [id number]      DOB: Dec 16, 1960   HPI Brittany Nunez is here for follow up and re-evaluation of chronic medical conditions, medication management and review of any available recent lab and radiology data.  Preventive health is updated, specifically  Cancer screening and Immunization.   Questions or concerns regarding consultations or procedures which the PT has had in the interim are  addressed. The PT denies any adverse reactions to current medications since the last visit.  There are no new concerns.  There are no specific complaints   ROS Denies recent fever or chills. Denies sinus pressure, nasal congestion, ear pain or sore throat. Denies chest congestion, productive cough or wheezing. Denies chest pains, palpitations and leg swelling Denies abdominal pain, nausea, vomiting,diarrhea or constipation.   Denies dysuria, frequency, hesitancy or incontinence. Denies joint pain, swelling and limitation in mobility. Denies headaches, seizures, numbness, or tingling. Denies depression, anxiety or insomnia. Denies skin break down or rash. Denies polyuria, polydipsia, blurred vision , or hypoglycemic episodes.    PE  BP 124/80 mmHg  Pulse 96  Resp 18  Ht  (1.651 m)  Wt 207 lb (93.895 kg)  BMI 34.45 kg/m2  SpO2 94%  Patient alert and oriented and in no cardiopulmonary distress.  HEENT: No facial asymmetry, EOMI,   oropharynx pink and moist.  Neck supple no JVD, no mass.  Chest: Clear to auscultation bilaterally.  CVS: S1, S2 no murmurs, no S3.Regular rate.  ABD: Soft non tender.   Ext: No edema  MS: Adequate ROM spine, shoulders, hips and knees.  Skin: Intact, no ulcerations or rash noted.  Psych: Good eye contact, normal affect. Memory intact not anxious or depressed appearing.  CNS: CN 2-12 intact, power,  normal throughout.no focal  deficits noted.   Assessment & Plan   Controlled type 2 diabetes mellitus without complication (HCC) Controlled, no change in medication Brittany Nunez is reminded of the importance of commitment to daily physical activity for 30 minutes or more, as able and the need to limit carbohydrate intake to 30 to 60 grams per meal to help with blood sugar control.   The need to take medication as prescribed, test blood sugar as directed, and to call between visits if there is a concern that blood sugar is uncontrolled is also discussed.   Brittany Nunez is reminded of the importance of daily foot exam, annual eye examination, and good blood sugar, blood pressure and cholesterol control.  Diabetic Labs Latest Ref Rng 07/19/2015 12/21/2014 07/12/2014 02/08/2014 10/07/2013  HbA1c <5.7 % 6.3(H) 6.3(H) 5.9(H) 6.1(H) 6.0(H)  Microalbumin Not estab mg/dL <0.8 - 0.4 - -  Micro/Creat Ratio <30 mcg/mg creat SEE NOTE - 2.8 - -  Chol 125 - 200 mg/dL 657 846 962 - -  HDL >=95 mg/dL 28(U) 52 13(K) - -  Calc LDL <130 mg/dL 94 440(N) 027(O) - -  Triglycerides <150 mg/dL 536 64 56 - -  Creatinine 0.50 - 1.05 mg/dL 6.44 0.34 7.42 5.95 6.38   BP/Weight 08/01/2015 06/06/2015 03/23/2015 02/15/2015 12/21/2014 07/28/2014 04/02/2014  Systolic BP 124 128 124 130 120 120 115  Diastolic BP 80 78 80 80 82 80 79  Wt. (Lbs) 207 204 211 212 202 179.8 185.6  BMI 34.45 33.95 35.11 35.28 33.61 29.03 29.97   Foot/eye exam  completion dates Latest Ref Rng 02/15/2015 09/21/2014  Eye Exam No Retinopathy - No Retinopathy  Foot Form Completion - Done -         Bulge of lumbar disc without myelopathy Chronic pain mangement is to remain the same as her pain is adequately controlled  Depression Controlled, no change in medication   Obesity Deteriorated. Patient re-educated about  the importance of commitment to a  minimum of 150 minutes of exercise per week.  The importance of healthy food choices with portion control discussed. Encouraged to  start a food diary, count calories and to consider  joining a support group. Sample diet sheets offered. Goals set by the patient for the next several months.   Weight /BMI 08/01/2015 06/06/2015 03/23/2015  WEIGHT 207 lb 204 lb 211 lb  HEIGHT     BMI 34.45 kg/m2 33.95 kg/m2 35.11 kg/m2    Current exercise per week 90 minutes.   Anemia Iron deficiency and reports intolerance to oral iron with constipation. She is to attempt aggressive strategy for preventing constipation and will call if not working for referral for parenteral iron Rept lab in 4 months       Review of Systems     Objective:   Physical Exam        Assessment & Plan:

## 2015-08-06 NOTE — Assessment & Plan Note (Signed)
Chronic pain mangement is to remain the same as her pain is adequately controlled

## 2015-08-06 NOTE — Assessment & Plan Note (Signed)
Controlled, no change in medication Brittany Nunez is reminded of the importance of commitment to daily physical activity for 30 minutes or more, as able and the need to limit carbohydrate intake to 30 to 60 grams per meal to help with blood sugar control.   The need to take medication as prescribed, test blood sugar as directed, and to call between visits if there is a concern that blood sugar is uncontrolled is also discussed.   Brittany Nunez is reminded of the importance of daily foot exam, annual eye examination, and good blood sugar, blood pressure and cholesterol control.  Diabetic Labs Latest Ref Rng 07/19/2015 12/21/2014 07/12/2014 02/08/2014 10/07/2013  HbA1c <5.7 % 6.3(H) 6.3(H) 5.9(H) 6.1(H) 6.0(H)  Microalbumin Not estab mg/dL <1.6 - 0.4 - -  Micro/Creat Ratio <30 mcg/mg creat SEE NOTE - 2.8 - -  Chol 125 - 200 mg/dL 109 604 540 - -  HDL >=98 mg/dL 11(B) 52 14(N) - -  Calc LDL <130 mg/dL 94 829(F) 621(H) - -  Triglycerides <150 mg/dL 086 64 56 - -  Creatinine 0.50 - 1.05 mg/dL 5.78 4.69 6.29 5.28 4.13   BP/Weight 08/01/2015 06/06/2015 03/23/2015 02/15/2015 12/21/2014 07/28/2014 04/02/2014  Systolic BP 124 128 124 130 120 120 115  Diastolic BP 80 78 80 80 82 80 79  Wt. (Lbs) 207 204 211 212 202 179.8 185.6  BMI 34.45 33.95 35.11 35.28 33.61 29.03 29.97   Foot/eye exam completion dates Latest Ref Rng 02/15/2015 09/21/2014  Eye Exam No Retinopathy - No Retinopathy  Foot Form Completion - Done -

## 2015-08-06 NOTE — Assessment & Plan Note (Signed)
Deteriorated. Patient re-educated about  the importance of commitment to a  minimum of 150 minutes of exercise per week.  The importance of healthy food choices with portion control discussed. Encouraged to start a food diary, count calories and to consider  joining a support group. Sample diet sheets offered. Goals set by the patient for the next several months.   Weight /BMI 08/01/2015 06/06/2015 03/23/2015  WEIGHT 207 lb 204 lb 211 lb  HEIGHT     BMI 34.45 kg/m2 33.95 kg/m2 35.11 kg/m2    Current exercise per week 90 minutes.

## 2015-08-06 NOTE — Assessment & Plan Note (Signed)
Iron deficiency and reports intolerance to oral iron with constipation. She is to attempt aggressive strategy for preventing constipation and will call if not working for referral for parenteral iron Rept lab in 4 months

## 2015-08-06 NOTE — Assessment & Plan Note (Signed)
Controlled, no change in medication  

## 2015-08-07 ENCOUNTER — Other Ambulatory Visit: Payer: Self-pay | Admitting: Family Medicine

## 2015-08-10 ENCOUNTER — Other Ambulatory Visit: Payer: Self-pay | Admitting: Family Medicine

## 2015-08-17 ENCOUNTER — Telehealth: Payer: Self-pay | Admitting: Family Medicine

## 2015-08-17 ENCOUNTER — Other Ambulatory Visit: Payer: Self-pay

## 2015-08-17 MED ORDER — GABAPENTIN 800 MG PO TABS: 800.0000 mg | ORAL_TABLET | Freq: Three times a day (TID) | ORAL | Status: DC

## 2015-08-17 NOTE — Telephone Encounter (Signed)
Med refilled.

## 2015-08-17 NOTE — Telephone Encounter (Signed)
Patient is asking for a refill on gabapentin (NEURONTIN) 800 MG tablet  Please advise?

## 2015-08-23 ENCOUNTER — Other Ambulatory Visit: Payer: Self-pay | Admitting: Family Medicine

## 2015-09-12 ENCOUNTER — Other Ambulatory Visit: Payer: Self-pay | Admitting: Family Medicine

## 2015-09-13 ENCOUNTER — Other Ambulatory Visit: Payer: Self-pay

## 2015-10-11 ENCOUNTER — Other Ambulatory Visit: Payer: Self-pay

## 2015-10-20 ENCOUNTER — Other Ambulatory Visit: Payer: Self-pay | Admitting: Family Medicine

## 2015-11-02 ENCOUNTER — Ambulatory Visit (INDEPENDENT_AMBULATORY_CARE_PROVIDER_SITE_OTHER): Payer: BLUE CROSS/BLUE SHIELD | Admitting: Family Medicine

## 2015-11-02 ENCOUNTER — Encounter: Payer: Self-pay | Admitting: Family Medicine

## 2015-11-02 VITALS — BP 120/84 | HR 92 | Resp 16 | Ht 65.0 in | Wt 206.1 lb

## 2015-11-02 DIAGNOSIS — Z23 Encounter for immunization: Secondary | ICD-10-CM

## 2015-11-02 DIAGNOSIS — F329 Major depressive disorder, single episode, unspecified: Secondary | ICD-10-CM

## 2015-11-02 DIAGNOSIS — H6122 Impacted cerumen, left ear: Secondary | ICD-10-CM | POA: Insufficient documentation

## 2015-11-02 DIAGNOSIS — E669 Obesity, unspecified: Secondary | ICD-10-CM

## 2015-11-02 DIAGNOSIS — E119 Type 2 diabetes mellitus without complications: Secondary | ICD-10-CM

## 2015-11-02 DIAGNOSIS — F32A Depression, unspecified: Secondary | ICD-10-CM

## 2015-11-02 DIAGNOSIS — H6120 Impacted cerumen, unspecified ear: Secondary | ICD-10-CM | POA: Insufficient documentation

## 2015-11-02 DIAGNOSIS — K219 Gastro-esophageal reflux disease without esophagitis: Secondary | ICD-10-CM

## 2015-11-02 LAB — BASIC METABOLIC PANEL WITH GFR
BUN: 10 mg/dL (ref 7–25)
CHLORIDE: 108 mmol/L (ref 98–110)
CO2: 31 mmol/L (ref 20–31)
CREATININE: 0.96 mg/dL (ref 0.50–1.05)
Calcium: 9.3 mg/dL (ref 8.6–10.4)
GFR, Est African American: 76 mL/min (ref >=60)
GFR, Est Non African American: 66 mL/min (ref >=60)
Glucose, Bld: 121 mg/dL — ABNORMAL HIGH (ref 65–99)
POTASSIUM: 5.1 mmol/L (ref 3.5–5.3)
SODIUM: 147 mmol/L — AB (ref 135–146)

## 2015-11-02 LAB — HEMOGLOBIN A1C
Hgb A1c MFr Bld: 6.1 % — ABNORMAL HIGH (ref <5.7)
MEAN PLASMA GLUCOSE: 128 mg/dL

## 2015-11-02 NOTE — Patient Instructions (Signed)
Annual wellness in 4 month, call if you need me sooner  You need ENT to flush your left ear and are referred  Labs today, you will be contacted tomorrow re appropriate medication for your diabetes, call in the [place] to speak with nurse if you have not heard from [place] pleae  HBa1C, chem 7 and eGFR today  Pneumonia 23 vaccine today  PLS sched your mammogram this is due  Please work on good  health habits so that your health will improve. 1. Commitment to daily physical activity for 30 to 60  minutes, if you are able to do this.  2. Commitment to wise food choices. Aim for half of your  food intake to be vegetable and fruit, one quarter starchy foods, and one quarter protein. Try to eat on a regular schedule  3 meals per day, snacking between meals should be limited to vegetables or fruits or small portions of nuts. 64 ounces of water per day is generally recommended, unless you have specific health conditions, like heart failure or kidney failure where you will need to limit fluid intake.  3. Commitment to sufficient and a  good quality of physical and mental rest daily, generally between 6 to 8 hours per day.  WITH PERSISTANCE AND PERSEVERANCE, THE IMPOSSIBLE , BECOMES THE NORM!   Thanks for choosing [facility], we consider it a privelige to serve you.

## 2015-11-03 NOTE — Assessment & Plan Note (Signed)
Improved Brittany Nunez is reminded of the importance of commitment to daily physical activity for 30 minutes or more, as able and the need to limit carbohydrate intake to 30 to 60 grams per meal to help with blood sugar control.   The need to take medication as prescribed, test blood sugar as directed, and to call between visits if there is a concern that blood sugar is uncontrolled is also discussed.   Brittany Nunez is reminded of the importance of daily foot exam, annual eye examination, and good blood sugar, blood pressure and cholesterol control.  Diabetic Labs Latest Ref Rng 11/02/2015 07/19/2015 12/21/2014 07/12/2014 02/08/2014  HbA1c <5.7 % 6.1(H) 6.3(H) 6.3(H) 5.9(H) 6.1(H)  Microalbumin Not estab mg/dL - <1.6<0.2 - 0.4 -  Micro/Creat Ratio <30 mcg/mg creat - SEE NOTE - 2.8 -  Chol 125 - 200 mg/dL - 109151 604168 540156 -  HDL >=98>=46 mg/dL - 11(B35(L) 52 14(N38(L) -  Calc LDL <130 mg/dL - 94 829(F103(H) 621(H107(H) -  Triglycerides <150 mg/dL - 086110 64 56 -  Creatinine 0.50 - 1.05 mg/dL 5.780.96 4.690.93 6.290.95 5.280.89 4.130.98   BP/Weight 11/02/2015 08/01/2015 06/06/2015 03/23/2015 02/15/2015 12/21/2014 07/28/2014  Systolic BP 120 124 128 124 130 120 120  Diastolic BP 84 80 78 80 80 82 80  Wt. (Lbs) 206.12 207 204 211 212 202 179.8  BMI 34.3 34.45 33.95 35.11 35.28 33.61 29.03   Foot/eye exam completion dates Latest Ref Rng 02/15/2015 09/21/2014  Eye Exam No Retinopathy - No Retinopathy  Foot Form Completion - Done -

## 2015-11-03 NOTE — Assessment & Plan Note (Signed)
Controlled, no change in medication  

## 2015-11-03 NOTE — Assessment & Plan Note (Signed)
Left ear impaction with discomfort and hearing loss, needs ENT eval asap

## 2015-11-03 NOTE — Progress Notes (Signed)
Subjective:    Patient ID: Brittany Nunez, female    DOB: [birth year], 56 y.o.   MRN: [id number]  HPI   Brittany Nunez     MRN: [id number]      DOB: 27-Jan-1961   HPI [name] is here for follow up and re-evaluation of chronic medical conditions, medication management and review of any available recent lab and radiology data.  Preventive health is updated, specifically  Cancer screening and Immunization.    The PT denies any adverse reactions to current medications since the last visit. Has been off kombiglyze for 3 weeks, due to cost Denies polyuria, polydipsia, blurred vision , or hypoglycemic episodes.  2 week h/o pressure , fullness and decreased hearing in left ear, tried to get out wax unsuccessful, has also put cotton in ear   ROS Denies recent fever or chills. Denies sinus pressure, nasal congestion,  Denies chest congestion, productive cough or wheezing. Denies chest pains, palpitations and leg swelling Denies abdominal pain, nausea, vomiting,diarrhea or constipation.   Denies dysuria, frequency, hesitancy or incontinence. Denies joint pain, swelling and limitation in mobility. Denies headaches, seizures, numbness, or tingling. Denies depression, anxiety or insomnia. Denies skin break down or rash.   PE  BP 120/84 mmHg  Pulse 92  Resp 16  Ht  (1.651 m)  Wt 206 lb 1.9 oz (93.495 kg)  BMI 34.30 kg/m2  SpO2 97%  Patient alert and oriented and in no cardiopulmonary distress.  HEENT: No facial asymmetry, EOMI,   oropharynx pink and moist.  Neck supple no JVD, no mass. Left tM occl;uded with wax, right tM clear Chest: Clear to auscultation bilaterally.  CVS: S1, S2 no murmurs, no S3.Regular rate.  ABD: Soft non tender.   Ext: No edema  MS: Adequate ROM spine, shoulders, hips and knees.  Skin: Intact, no ulcerations or rash noted.  Psych: Good eye contact, normal affect. Memory intact not anxious or depressed appearing.  CNS: CN 2-12 intact, power,  normal  throughout.no focal deficits noted.   Assessment & Plan   Cerumen impaction Left ear impaction with discomfort and hearing loss, needs ENT eval asap  Controlled type 2 diabetes mellitus without complication (HCC) Improved [name] is reminded of the importance of commitment to daily physical activity for 30 minutes or more, as able and the need to limit carbohydrate intake to 30 to 60 grams per meal to help with blood sugar control.   The need to take medication as prescribed, test blood sugar as directed, and to call between visits if there is a concern that blood sugar is uncontrolled is also discussed.   [name] is reminded of the importance of daily foot exam, annual eye examination, and good blood sugar, blood pressure and cholesterol control.  Diabetic Labs Latest Ref Rng 11/02/2015 07/19/2015 12/21/2014 07/12/2014 02/08/2014  HbA1c <5.7 % 6.1(H) 6.3(H) 6.3(H) 5.9(H) 6.1(H)  Microalbumin Not estab mg/dL - <5.7 - 0.4 -  Micro/Creat Ratio <30 mcg/mg creat - SEE NOTE - 2.8 -  Chol 125 - 200 mg/dL - 846 962 952 -  HDL >=84 mg/dL - 13(K) 52 44(W) -  Calc LDL <130 mg/dL - 94 102(V) 253(G) -  Triglycerides <150 mg/dL - 644 64 56 -  Creatinine 0.50 - 1.05 mg/dL 0.34 7.42 5.95 6.38 7.56   BP/Weight 11/02/2015 08/01/2015 06/06/2015 03/23/2015 02/15/2015 12/21/2014 07/28/2014  Systolic BP 120 124 128 124 130 120 120  Diastolic BP 84 80 78 80 80 82 80  Wt. (  Lbs) 206.12 207 204 211 212 202 179.8  BMI 34.3 34.45 33.95 35.11 35.28 33.61 29.03   Foot/eye exam completion dates Latest Ref Rng 02/15/2015 09/21/2014  Eye Exam No Retinopathy - No Retinopathy  Foot Form Completion - Done -         Depression Controlled, no change in medication   Obesity Deteriorated. Patient re-educated about  the importance of commitment to a  minimum of 150 minutes of exercise per week.  The importance of healthy food choices with portion control discussed. Encouraged to start a food diary, count calories and  to consider  joining a support group. Sample diet sheets offered. Goals set by the patient for the next several months.   Weight /BMI 11/02/2015 08/01/2015 06/06/2015  WEIGHT 206 lb 1.9 oz 207 lb 204 lb  HEIGHT 5\' 5"  5\' 5"  5\' 5"   BMI 34.3 kg/m2 34.45 kg/m2 33.95 kg/m2    Current exercise per week 90 minutes.   GERD (gastroesophageal reflux disease) Controlled, no change in medication       Review of Systems     Objective:   Physical Exam        Assessment & Plan:

## 2015-11-03 NOTE — Assessment & Plan Note (Signed)
Deteriorated. Patient re-educated about  the importance of commitment to a  minimum of 150 minutes of exercise per week.  The importance of healthy food choices with portion control discussed. Encouraged to start a food diary, count calories and to consider  joining a support group. Sample diet sheets offered. Goals set by the patient for the next several months.   Weight /BMI 11/02/2015 08/01/2015 06/06/2015  WEIGHT 206 lb 1.9 oz 207 lb 204 lb  HEIGHT 5\' 5"  5\' 5"  5\' 5"   BMI 34.3 kg/m2 34.45 kg/m2 33.95 kg/m2    Current exercise per week 90 minutes.

## 2015-11-08 ENCOUNTER — Other Ambulatory Visit: Payer: Self-pay

## 2015-11-08 DIAGNOSIS — E119 Type 2 diabetes mellitus without complications: Secondary | ICD-10-CM

## 2015-11-08 MED ORDER — METFORMIN HCL 1000 MG PO TABS: 1000.0000 mg | ORAL_TABLET | Freq: Two times a day (BID) | ORAL | Status: DC

## 2015-11-15 DIAGNOSIS — H6122 Impacted cerumen, left ear: Secondary | ICD-10-CM | POA: Diagnosis not present

## 2015-11-15 DIAGNOSIS — J343 Hypertrophy of nasal turbinates: Secondary | ICD-10-CM | POA: Diagnosis not present

## 2015-11-15 DIAGNOSIS — H9012 Conductive hearing loss, unilateral, left ear, with unrestricted hearing on the contralateral side: Secondary | ICD-10-CM | POA: Insufficient documentation

## 2015-12-12 ENCOUNTER — Other Ambulatory Visit: Payer: Self-pay | Admitting: Family Medicine

## 2016-01-10 ENCOUNTER — Other Ambulatory Visit: Payer: Self-pay | Admitting: Family Medicine

## 2016-01-29 ENCOUNTER — Other Ambulatory Visit: Payer: Self-pay | Admitting: Family Medicine

## 2016-02-08 ENCOUNTER — Ambulatory Visit (HOSPITAL_COMMUNITY)
Admission: RE | Admit: 2016-02-08 | Discharge: 2016-02-08 | Disposition: A | Discharge status: Home / Self Care | Payer: BLUE CROSS/BLUE SHIELD | Source: Ambulatory Visit | Attending: Family Medicine | Admitting: Family Medicine

## 2016-02-08 DIAGNOSIS — Z1231 Encounter for screening mammogram for malignant neoplasm of breast: Secondary | ICD-10-CM | POA: Insufficient documentation

## 2016-02-10 ENCOUNTER — Telehealth: Payer: Self-pay

## 2016-02-10 DIAGNOSIS — E559 Vitamin D deficiency, unspecified: Secondary | ICD-10-CM

## 2016-02-10 DIAGNOSIS — E119 Type 2 diabetes mellitus without complications: Secondary | ICD-10-CM

## 2016-02-10 DIAGNOSIS — D508 Other iron deficiency anemias: Secondary | ICD-10-CM

## 2016-02-10 NOTE — Telephone Encounter (Signed)
-----  Message from Fayrene Helper, MD sent at 02/10/2016 12:20 PM EDT ----- Regarding: pls order these labs for her sept appt CBC, iron and ferritin, hBA1C, chem 7 and eGFR, Vit D

## 2016-02-10 NOTE — Telephone Encounter (Signed)
Labs ordered. Will Schedule visit

## 2016-02-23 DIAGNOSIS — D508 Other iron deficiency anemias: Secondary | ICD-10-CM | POA: Diagnosis not present

## 2016-02-23 DIAGNOSIS — E559 Vitamin D deficiency, unspecified: Secondary | ICD-10-CM | POA: Diagnosis not present

## 2016-02-23 DIAGNOSIS — E119 Type 2 diabetes mellitus without complications: Secondary | ICD-10-CM | POA: Diagnosis not present

## 2016-02-23 LAB — CBC
HCT: 34.7 % — ABNORMAL LOW (ref 35.0–45.0)
HEMOGLOBIN: 11.3 g/dL — AB (ref 11.7–15.5)
MCH: 25.3 pg — ABNORMAL LOW (ref 27.0–33.0)
MCHC: 32.6 g/dL (ref 32.0–36.0)
MCV: 77.8 fL — AB (ref 80.0–100.0)
MPV: 8.8 fL (ref 7.5–12.5)
PLATELETS: 352 10*3/uL (ref 140–400)
RBC: 4.46 MIL/uL (ref 3.80–5.10)
RDW: 15.9 % — ABNORMAL HIGH (ref 11.0–15.0)
WBC: 6.4 10*3/uL (ref 3.8–10.8)

## 2016-02-24 LAB — HEMOGLOBIN A1C
HEMOGLOBIN A1C: 7.1 % — AB (ref <5.7)
MEAN PLASMA GLUCOSE: 157 mg/dL

## 2016-02-24 LAB — BASIC METABOLIC PANEL WITH GFR
BUN: 14 mg/dL (ref 7–25)
CO2: 26 mmol/L (ref 20–31)
CREATININE: 0.93 mg/dL (ref 0.50–1.05)
Calcium: 8.8 mg/dL (ref 8.6–10.4)
Chloride: 105 mmol/L (ref 98–110)
GFR, EST AFRICAN AMERICAN: 79 mL/min (ref >=60)
GFR, Est Non African American: 69 mL/min (ref >=60)
Glucose, Bld: 89 mg/dL (ref 65–99)
POTASSIUM: 4.3 mmol/L (ref 3.5–5.3)
SODIUM: 140 mmol/L (ref 135–146)

## 2016-02-24 LAB — FERRITIN: FERRITIN: 21 ng/mL (ref 10–232)

## 2016-02-24 LAB — VITAMIN D 25 HYDROXY (VIT D DEFICIENCY, FRACTURES): VIT D 25 HYDROXY: 19 ng/mL — AB (ref 30–100)

## 2016-02-24 LAB — IRON: Iron: 37 ug/dL — ABNORMAL LOW (ref 45–160)

## 2016-02-28 ENCOUNTER — Ambulatory Visit (INDEPENDENT_AMBULATORY_CARE_PROVIDER_SITE_OTHER): Payer: BLUE CROSS/BLUE SHIELD | Admitting: Family Medicine

## 2016-02-28 ENCOUNTER — Encounter: Payer: Self-pay | Admitting: Family Medicine

## 2016-02-28 ENCOUNTER — Other Ambulatory Visit (HOSPITAL_COMMUNITY)
Admission: RE | Admit: 2016-02-28 | Discharge: 2016-02-28 | Disposition: A | Discharge status: Home / Self Care | Payer: BLUE CROSS/BLUE SHIELD | Source: Ambulatory Visit | Attending: Family Medicine | Admitting: Family Medicine

## 2016-02-28 VITALS — BP 130/100 | HR 91 | Resp 16 | Ht 65.0 in | Wt 212.4 lb

## 2016-02-28 DIAGNOSIS — E119 Type 2 diabetes mellitus without complications: Secondary | ICD-10-CM | POA: Diagnosis not present

## 2016-02-28 DIAGNOSIS — M5440 Lumbago with sciatica, unspecified side: Secondary | ICD-10-CM

## 2016-02-28 DIAGNOSIS — Z23 Encounter for immunization: Secondary | ICD-10-CM

## 2016-02-28 DIAGNOSIS — Z1211 Encounter for screening for malignant neoplasm of colon: Secondary | ICD-10-CM | POA: Diagnosis not present

## 2016-02-28 DIAGNOSIS — Z0000 Encounter for general adult medical examination without abnormal findings: Secondary | ICD-10-CM | POA: Diagnosis not present

## 2016-02-28 DIAGNOSIS — Z01419 Encounter for gynecological examination (general) (routine) without abnormal findings: Secondary | ICD-10-CM | POA: Diagnosis not present

## 2016-02-28 DIAGNOSIS — I10 Essential (primary) hypertension: Secondary | ICD-10-CM | POA: Insufficient documentation

## 2016-02-28 DIAGNOSIS — Z1151 Encounter for screening for human papillomavirus (HPV): Secondary | ICD-10-CM | POA: Diagnosis not present

## 2016-02-28 DIAGNOSIS — IMO0001 Reserved for inherently not codable concepts without codable children: Secondary | ICD-10-CM

## 2016-02-28 DIAGNOSIS — R030 Elevated blood-pressure reading, without diagnosis of hypertension: Secondary | ICD-10-CM | POA: Diagnosis not present

## 2016-02-28 DIAGNOSIS — Z124 Encounter for screening for malignant neoplasm of cervix: Secondary | ICD-10-CM

## 2016-02-28 DIAGNOSIS — D508 Other iron deficiency anemias: Secondary | ICD-10-CM

## 2016-02-28 LAB — POC HEMOCCULT BLD/STL (OFFICE/1-CARD/DIAGNOSTIC): Fecal Occult Blood, POC: NEGATIVE

## 2016-02-28 MED ORDER — METHYLPREDNISOLONE ACETATE 80 MG/ML IJ SUSP
80.0000 mg | Freq: Once | INTRAMUSCULAR | Status: AC
  Administered 2016-02-28: 80 mg via INTRAMUSCULAR

## 2016-02-28 MED ORDER — FLUOXETINE HCL 20 MG PO CAPS: 20.0000 mg | ORAL_CAPSULE | Freq: Every day | ORAL | 5 refills | Status: DC

## 2016-02-28 MED ORDER — TEMAZEPAM 30 MG PO CAPS: 30.0000 mg | ORAL_CAPSULE | Freq: Every day | ORAL | 3 refills | Status: DC

## 2016-02-28 MED ORDER — BENAZEPRIL HCL 10 MG PO TABS: 10.0000 mg | ORAL_TABLET | Freq: Every day | ORAL | 3 refills | Status: DC

## 2016-02-28 MED ORDER — PREDNISONE 5 MG (21) PO TBPK: 5.0000 mg | ORAL_TABLET | ORAL | 0 refills | Status: DC

## 2016-02-28 MED ORDER — KETOROLAC TROMETHAMINE 60 MG/2ML IM SOLN
60.0000 mg | Freq: Once | INTRAMUSCULAR | Status: AC
  Administered 2016-02-28: 60 mg via INTRAMUSCULAR

## 2016-02-28 MED ORDER — VENLAFAXINE HCL ER 37.5 MG PO CP24: ORAL_CAPSULE | ORAL | 5 refills | Status: DC

## 2016-02-28 NOTE — Assessment & Plan Note (Signed)
Uncontrolled.Toradol and depo medrol administered IM in the office , to be followed by a short course of oral prednisone and NSAIDS.  

## 2016-02-28 NOTE — Assessment & Plan Note (Signed)

## 2016-02-28 NOTE — Assessment & Plan Note (Signed)
After obtaining informed consent, the vaccine is  administered by LPN.  

## 2016-02-28 NOTE — Patient Instructions (Addendum)
F/u Jan 28 or after, call if you need me sooner  Flu vaccine today  You are referred for eye exam, past due  Injections today for back pain and 6 day course of prednisone prescribed  pls re commit to daily exercise, and diet rich in vegetables and fresh /frozen fruit with a goal to improved health and weight loss  New for blood pressure and kidney protection is benazepril one daily  Fasting lipid, cmp and eGFR, hBA1C, cBC , iron and ferritin, tSH and microalb Jan 24 or shortly after  Thank you  for choosing [facility]. We consider it a privelige to serve you.  Delivering excellent health care in a caring and  compassionate way is our goal.  Partnering with you,  so that together we can achieve this goal is our strategy.

## 2016-02-28 NOTE — Progress Notes (Signed)
    Brittany Nunez     MRN: [id number]      DOB: 24-Aug-1960  HPI: Patient is in for annual physical exam. 1 week h/o increased back Protain, wants shots. Out of meds for 2 months,  Financial challengeRecent labs, if available are reviewed. Immunization is reviewed , and  updated    PE: Pleasant  female, alert and oriented x 3, in no cardio-pulmonary distress.pt in pain Afebrile. HEENT No facial trauma or asymetry. Sinuses non tender.  Extra occullar muscles intact, pupils equally reactive to light. External ears normal, tympanic membranes clear. Oropharynx moist, no exudate. Neck: supple, no adenopathy,JVD or thyromegaly.No bruits.  Chest: Clear to ascultation bilaterally.No crackles or wheezes. Non tender to palpation  Breast: No asymetry,no masses or lumps. No tenderness. No nipple discharge or inversion. No axillary or supraclavicular adenopathy  Cardiovascular system; Heart sounds normal,  S1 and  S2 ,no S3.  No murmur, or thrill. Apical beat not displaced Peripheral pulses normal.  Abdomen: Soft, non tender, no organomegaly or masses. No bruits. Bowel sounds normal. No guarding, tenderness or rebound.  Rectal:  Normal sphincter tone. No rectal mass. Guaiac negative stool.  GU: External genitalia normal female genitalia , normal female distribution of hair. No lesions. Urethral meatus normal in size, no  Prolapse, no lesions visibly  Present. Bladder non tender. Vagina pink and moist , with no visible lesions , discharge present . Adequate pelvic support no  cystocele or rectocele noted + Uterus absent, no adnexal masses, no  adnexal tenderness.   Musculoskeletal exam: Decreased  ROM of spine, adequate in hips , shoulders and knees. No deformity ,swelling or crepitus noted. No muscle wasting or atrophy.   Neurologic: Cranial nerves 2 to 12 intact. Power, tone ,sensation and reflexes normal throughout. No disturbance in gait. No tremor.  Skin: Intact,  no ulceration, erythema , scaling or rash noted. Pigmentation normal throughout  Psych; Normal mood and affect. Judgement and concentration normal   Assessment & Plan:  Annual physical exam Annual exam as documented. Counseling done  re healthy lifestyle involving commitment to 150 minutes exercise per week, heart healthy diet, and attaining healthy weight.The importance of adequate sleep also discussed. Regular seat belt use and home safety, is also discussed. Changes in health habits are decided on by the patient with goals and time frames  set for achieving them. Immunization and cancer screening needs are specifically addressed at this visit.   Elevated blood pressure New dx start low dose ACE for renal protection  Back pain Uncontrolled.Toradol and depo medrol administered IM in the office , to be followed by a short course of oral prednisone and NSAIDS.   Need for prophylactic vaccination and inoculation against influenza After obtaining informed consent, the vaccine is  administered by LPN.

## 2016-02-28 NOTE — Assessment & Plan Note (Signed)
New dx start low dose ACE for renal protection

## 2016-03-01 LAB — CYTOLOGY - PAP

## 2016-03-05 ENCOUNTER — Other Ambulatory Visit: Payer: Self-pay | Admitting: Family Medicine

## 2016-03-12 ENCOUNTER — Encounter: Payer: Self-pay | Admitting: Family Medicine

## 2016-03-12 ENCOUNTER — Ambulatory Visit (INDEPENDENT_AMBULATORY_CARE_PROVIDER_SITE_OTHER): Payer: BLUE CROSS/BLUE SHIELD | Admitting: Family Medicine

## 2016-03-12 ENCOUNTER — Other Ambulatory Visit (HOSPITAL_COMMUNITY)
Admission: AD | Admit: 2016-03-12 | Discharge: 2016-03-12 | Disposition: A | Discharge status: Home / Self Care | Payer: BLUE CROSS/BLUE SHIELD | Source: Skilled Nursing Facility | Attending: Family Medicine | Admitting: Family Medicine

## 2016-03-12 VITALS — BP 118/82 | HR 88 | Resp 16 | Ht 65.0 in | Wt 210.1 lb

## 2016-03-12 DIAGNOSIS — N3001 Acute cystitis with hematuria: Secondary | ICD-10-CM | POA: Insufficient documentation

## 2016-03-12 DIAGNOSIS — E669 Obesity, unspecified: Secondary | ICD-10-CM

## 2016-03-12 LAB — POCT URINALYSIS DIPSTICK
Bilirubin, UA: NEGATIVE
GLUCOSE UA: NEGATIVE
Ketones, UA: NEGATIVE
NITRITE UA: NEGATIVE
PROTEIN UA: 30
SPEC GRAV UA: 1.01
UROBILINOGEN UA: 0.2
pH, UA: 6

## 2016-03-12 MED ORDER — CIPROFLOXACIN HCL 500 MG PO TABS: 500.0000 mg | ORAL_TABLET | Freq: Two times a day (BID) | ORAL | 0 refills | Status: DC

## 2016-03-12 NOTE — Patient Instructions (Signed)
F/u in January , call if you need me before  Three day course of antibiotic sent, take entire course and ensure you drink at least 64 oz water daily and void often  Please work on good  health habits so that your health will improve. 1. Commitment to daily physical activity for 30 to 60  minutes, if you are able to do this.  2. Commitment to wise food choices. Aim for half of your  food intake to be vegetable and fruit, one quarter starchy foods, and one quarter protein. Try to eat on a regular schedule  3 meals per day, snacking between meals should be limited to vegetables or fruits or small portions of nuts. 64 ounces of water per day is generally recommended, unless you have specific health conditions, like heart failure or kidney failure where you will need to limit fluid intake.  3. Commitment to sufficient and a  good quality of physical and mental rest daily, generally between 6 to 8 hours per day.  WITH PERSISTANCE AND PERSEVERANCE, THE IMPOSSIBLE , BECOMES THE NORM!   Thank you  for choosing [facility]. We consider it a privelige to serve you.  Delivering excellent health care in a caring and  compassionate way is our goal.  Partnering with you,  so that together we can achieve this goal is our strategy.

## 2016-03-12 NOTE — Assessment & Plan Note (Signed)
Improved. Patient re-educated about  the importance of commitment to a  minimum of 150 minutes of exercise per week.  The importance of healthy food choices with portion control discussed. Encouraged to start a food diary, count calories and to consider  joining a support group. Sample diet sheets offered. Goals set by the patient for the next several months.   Weight /BMI 03/12/2016 02/28/2016 11/02/2015  WEIGHT 210 lb 1.9 oz 212 lb 6.4 oz 206 lb 1.9 oz  HEIGHT 5\' 5"  5\' 5"  5\' 5"   BMI 34.97 kg/m2 35.35 kg/m2 34.3 kg/m2

## 2016-03-12 NOTE — Assessment & Plan Note (Addendum)
5 day history, no fever or chills, abn UA, 3 day course of cipro f/u on c/s . Educated re habits to reduce UTI occurence

## 2016-03-12 NOTE — Progress Notes (Signed)
   Brittany Nunez     MRN: [id number]      DOB: [birth year]   HPI [name] is here with a 5 day h/o worsening dysuria and frequency , also c/o right flank pain (not new) denies fever or chills, urine looks "cloudy"  ROS Denies recent fever or chills. Denies sinus pressure, nasal congestion, ear pain or sore throat. Denies chest congestion, productive cough or wheezing. . Denies uncontrolled joint pain, swelling and limitation in mobility. Denies skin break down or rash.   PE  BP 118/82   Pulse 88   Resp 16   Ht 5\' 5"  (1.651 m)   Wt 210 lb 1.9 oz (95.3 kg)   SpO2 98%   BMI 34.97 kg/m   Patient alert and oriented and in no cardiopulmonary distress.  HEENT: No facial asymmetry, EOMI,   oropharynx pink and moist.  Neck supple no JVD, no mass.  Chest: Clear to auscultation bilaterally.  CVS: S1, S2 no murmurs, no S3.Regular rate.  ABD: Soft mild right renal angle tenderness and suprapubic tenderness, no guarding or rebound Ext: No edema  MS: Adequate ROM spine, shoulders, hips and knees.  Skin: Intact, no ulcerations or rash noted.    Assessment & Plan  Acute cystitis with hematuria 5 day history, no fever or chills, abn UA, 3 day course of cipro f/u on c/s . Educated re habits to reduce UTI occurence  Obesity Improved. Patient re-educated about  the importance of commitment to a  minimum of 150 minutes of exercise per week.  The importance of healthy food choices with portion control discussed. Encouraged to start a food diary, count calories and to consider  joining a support group. Sample diet sheets offered. Goals set by the patient for the next several months.   Weight /BMI 03/12/2016 02/28/2016 11/02/2015  WEIGHT 210 lb 1.9 oz 212 lb 6.4 oz 206 lb 1.9 oz  HEIGHT 5\' 5"  5\' 5"  5\' 5"   BMI 34.97 kg/m2 35.35 kg/m2 34.3 kg/m2

## 2016-03-14 LAB — URINE CULTURE

## 2016-04-30 ENCOUNTER — Telehealth: Payer: Self-pay | Admitting: Family Medicine

## 2016-04-30 ENCOUNTER — Other Ambulatory Visit: Payer: Self-pay

## 2016-04-30 MED ORDER — ACCU-CHEK FASTCLIX LANCETS MISC: 2 refills | Status: DC

## 2016-04-30 MED ORDER — GLUCOSE BLOOD VI STRP: ORAL_STRIP | 2 refills | Status: AC

## 2016-04-30 NOTE — Telephone Encounter (Signed)
Supplies refilled.

## 2016-04-30 NOTE — Telephone Encounter (Signed)
[facility] accu check aviva, please advise?

## 2016-05-29 ENCOUNTER — Other Ambulatory Visit: Payer: Self-pay | Admitting: Family Medicine

## 2016-07-05 ENCOUNTER — Other Ambulatory Visit: Payer: Self-pay | Admitting: Family Medicine

## 2016-07-08 ENCOUNTER — Other Ambulatory Visit: Payer: Self-pay | Admitting: Family Medicine

## 2016-07-23 ENCOUNTER — Ambulatory Visit: Payer: BLUE CROSS/BLUE SHIELD | Admitting: Family Medicine

## 2016-07-25 ENCOUNTER — Other Ambulatory Visit: Payer: Self-pay | Admitting: Family Medicine

## 2016-08-13 ENCOUNTER — Other Ambulatory Visit: Payer: Self-pay | Admitting: Family Medicine

## 2016-10-11 ENCOUNTER — Other Ambulatory Visit: Payer: Self-pay | Admitting: Family Medicine

## 2016-10-17 ENCOUNTER — Telehealth: Payer: Self-pay | Admitting: Family Medicine

## 2016-10-17 DIAGNOSIS — E119 Type 2 diabetes mellitus without complications: Secondary | ICD-10-CM

## 2016-10-17 NOTE — Telephone Encounter (Signed)
Mailed order to her because voicemail was full

## 2016-10-17 NOTE — Telephone Encounter (Signed)
Has not been in since 02/2016.  Needs hBA1C, fasting lipid, cmp and eGFR  Pls sched an appt innext 2 weeks and needs lab done at least 3 days  before   Hold refill till visit pls

## 2016-10-22 ENCOUNTER — Telehealth: Payer: Self-pay | Admitting: Family Medicine

## 2016-10-22 ENCOUNTER — Other Ambulatory Visit: Payer: Self-pay

## 2016-10-22 MED ORDER — TEMAZEPAM 30 MG PO CAPS: 30.0000 mg | ORAL_CAPSULE | Freq: Every day | ORAL | 0 refills | Status: DC

## 2016-10-22 NOTE — Telephone Encounter (Signed)
Patient called (left message on voice mail)  requesting a refill on her temazepam [facility]

## 2016-10-22 NOTE — Telephone Encounter (Signed)
1 refill only. Has appt 5/14

## 2016-11-05 ENCOUNTER — Ambulatory Visit (INDEPENDENT_AMBULATORY_CARE_PROVIDER_SITE_OTHER): Payer: BLUE CROSS/BLUE SHIELD | Admitting: Family Medicine

## 2016-11-05 ENCOUNTER — Telehealth: Payer: Self-pay | Admitting: Family Medicine

## 2016-11-05 ENCOUNTER — Encounter: Payer: Self-pay | Admitting: Family Medicine

## 2016-11-05 ENCOUNTER — Other Ambulatory Visit: Payer: Self-pay | Admitting: Family Medicine

## 2016-11-05 VITALS — BP 120/84 | HR 91 | Resp 16 | Ht 65.0 in | Wt 212.1 lb

## 2016-11-05 DIAGNOSIS — N3001 Acute cystitis with hematuria: Secondary | ICD-10-CM | POA: Diagnosis not present

## 2016-11-05 DIAGNOSIS — N760 Acute vaginitis: Secondary | ICD-10-CM

## 2016-11-05 DIAGNOSIS — F3289 Other specified depressive episodes: Secondary | ICD-10-CM

## 2016-11-05 DIAGNOSIS — I10 Essential (primary) hypertension: Secondary | ICD-10-CM | POA: Diagnosis not present

## 2016-11-05 DIAGNOSIS — M62838 Other muscle spasm: Secondary | ICD-10-CM

## 2016-11-05 DIAGNOSIS — E119 Type 2 diabetes mellitus without complications: Secondary | ICD-10-CM

## 2016-11-05 LAB — LIPID PANEL
Cholesterol: 159 mg/dL (ref <200)
HDL: 42 mg/dL — ABNORMAL LOW (ref >50)
LDL CALC: 105 mg/dL — AB (ref <100)
Total CHOL/HDL Ratio: 3.8 Ratio (ref <5.0)
Triglycerides: 58 mg/dL (ref <150)
VLDL: 12 mg/dL (ref <30)

## 2016-11-05 LAB — POCT URINALYSIS DIPSTICK
BILIRUBIN UA: NEGATIVE
Glucose, UA: NEGATIVE
Ketones, UA: NEGATIVE
NITRITE UA: NEGATIVE
PH UA: 6 (ref 5.0–8.0)
Protein, UA: NEGATIVE
Spec Grav, UA: 1.01 (ref 1.010–1.025)
Urobilinogen, UA: 0.2 E.U./dL

## 2016-11-05 LAB — CMP 10231
AG Ratio: 1.3 Ratio (ref 1.0–2.5)
ALK PHOS: 87 U/L (ref 33–130)
ALT: 15 U/L (ref 6–29)
AST: 17 U/L (ref 10–35)
Albumin: 3.8 g/dL (ref 3.6–5.1)
BILIRUBIN TOTAL: 0.3 mg/dL (ref 0.2–1.2)
BUN / CREAT RATIO: 10.2 ratio (ref 6–22)
BUN: 9 mg/dL (ref 7–25)
CALCIUM: 8.7 mg/dL (ref 8.6–10.4)
CHLORIDE: 106 mmol/L (ref 98–110)
CO2: 27 mmol/L (ref 20–31)
CREATININE: 0.88 mg/dL (ref 0.50–1.05)
GFR, EST AFRICAN AMERICAN: 84 mL/min (ref >=60)
GFR, Est Non African American: 73 mL/min (ref >=60)
GLOBULIN: 2.9 g/dL (ref 1.9–3.7)
Glucose, Bld: 136 mg/dL — ABNORMAL HIGH (ref 65–99)
Potassium: 4.2 mmol/L (ref 3.5–5.3)
Sodium: 141 mmol/L (ref 135–146)
Total Protein: 6.7 g/dL (ref 6.1–8.1)

## 2016-11-05 MED ORDER — FLUCONAZOLE 150 MG PO TABS: ORAL_TABLET | ORAL | 0 refills | Status: DC

## 2016-11-05 MED ORDER — CLOTRIMAZOLE-BETAMETHASONE 1-0.05 % EX CREA: 1.0000 "application " | TOPICAL_CREAM | Freq: Two times a day (BID) | CUTANEOUS | 1 refills | Status: DC

## 2016-11-05 MED ORDER — CIPROFLOXACIN HCL 500 MG PO TABS: 500.0000 mg | ORAL_TABLET | Freq: Two times a day (BID) | ORAL | 0 refills | Status: DC

## 2016-11-05 NOTE — Telephone Encounter (Signed)
Called patient to schedule CPE w/ Dr.Simpson after 03/01/17 per Brandi.  No answer; no vm.  She is scheduled for 9/10 at 2:20

## 2016-11-05 NOTE — Progress Notes (Signed)
Brittany Nunez     MRN: [id number]      DOB: 26-Dec-1960   HPI Brittany Nunez is here for follow up and re-evaluation of chronic medical conditions, medication management and review of any available recent lab and radiology data.  Preventive health is updated, specifically  Cancer screening and Immunization.   Questions or concerns regarding consultations or procedures which the PT has had in the interim are  addressed. The PT denies any adverse reactions to current medications since the last visit.  Frequent urination with burning C/o muscle spasms which awaken her Requests help with nutrition as she finds it difficult to lose weight and is not exercising C/o rash in vulva, has responded in the past to topical antifungal cream, now using gold bond powder with some relief  ROS Denies recent fever or chills. Denies sinus pressure, nasal congestion, ear pain or sore throat. Denies chest congestion, productive cough or wheezing. Denies chest pains, palpitations and leg swelling Denies abdominal pain, nausea, vomiting,diarrhea or constipation.   . Denies joint pain, swelling and limitation in mobility. Denies headaches, seizures, numbness, or tingling. Denies depression, anxiety or insomnia. Denies polyuria, polydipsia, blurred vision , or hypoglycemic episodes.   PE  BP 120/84   Pulse 91   Resp 16   Ht 5\' 5"  (1.651 m)   Wt 212 lb 1.9 oz (96.2 kg)   SpO2 98%   BMI 35.30 kg/m   Patient alert and oriented and in no cardiopulmonary distress.  HEENT: No facial asymmetry, EOMI,   oropharynx pink and moist.  Neck supple no JVD, no mass.  Chest: Clear to auscultation bilaterally.  CVS: S1, S2 no murmurs, no S3.Regular rate.  ABD: Soft non tender.   Ext: No edema  MS: Adequate ROM spine, shoulders, hips and knees.    Psych: Good eye contact, normal affect. Memory intact not anxious or depressed appearing.  CNS: CN 2-12 intact, power,  normal throughout.no focal deficits  noted.   Assessment & Plan  Essential hypertension Controlled, no change in medication DASH diet and commitment to daily physical activity for a minimum of 30 minutes discussed and encouraged, as a part of hypertension management. The importance of attaining a healthy weight is also discussed.  BP/Weight 11/05/2016 03/12/2016 02/28/2016 11/02/2015 08/01/2015 06/06/2015 03/23/2015  Systolic BP 120 118 130 120 124 128 124  Diastolic BP 84 82 100 84 80 78 80  Wt. (Lbs) 212.12 210.12 212.4 206.12 207 204 211  BMI 35.3 34.97 35.35 34.3 34.45 33.95 35.11       Controlled type 2 diabetes mellitus without complication (HCC) Controlled, no change in medication Brittany Nunez is reminded of the importance of commitment to daily physical activity for 30 minutes or more, as able and the need to limit carbohydrate intake to 30 to 60 grams per meal to help with blood sugar control.   The need to take medication as prescribed, test blood sugar as directed, and to call between visits if there is a concern that blood sugar is uncontrolled is also discussed.   Brittany Nunez is reminded of the importance of daily foot exam, annual eye examination, and good blood sugar, blood pressure and cholesterol control.  Diabetic Labs Latest Ref Rng & Units 11/05/2016 02/23/2016 11/02/2015 07/19/2015 12/21/2014  HbA1c <5.7 % 6.9(H) 7.1(H) 6.1(H) 6.3(H) 6.3(H)  Microalbumin Not estab mg/dL - - - <4.6 -  Micro/Creat Ratio <30 mcg/mg creat - - - SEE NOTE -  Chol <200 mg/dL 962 - - 952  168  HDL >50 mg/dL 16(X42(L) - - 09(U35(L) 52  Calc LDL <100 mg/dL 045(W105(H) - - 94 098(J103(H)  Triglycerides <150 mg/dL 58 - - 191110 64  Creatinine 0.50 - 1.05 mg/dL 4.780.88 2.950.93 6.210.96 3.080.93 6.570.95   BP/Weight 11/05/2016 03/12/2016 02/28/2016 11/02/2015 08/01/2015 06/06/2015 03/23/2015  Systolic BP 120 118 130 120 124 128 124  Diastolic BP 84 82 100 84 80 78 80  Wt. (Lbs) 212.12 210.12 212.4 206.12 207 204 211  BMI 35.3 34.97 35.35 34.3 34.45 33.95 35.11   Foot/eye exam  completion dates Latest Ref Rng & Units 02/28/2016 02/15/2015  Eye Exam No Retinopathy - -  Foot Form Completion - Done Done        Spasm of muscle Reports increased spasm, continue current baclofen dose , may need to consider dose increase, hopefully with exercise and improved health habits this will improve  Acute cystitis with hematuria Symptomatic with abn UA, has recurrent UTI, 3 day course of cipro prescribed wit fluconazole  Vulvovaginitis poitive rsponse to clotrimazole/ betamethasone cream in the past , same prescribed  Depression Controlled, no change in medication   Obesity Deteriorated. Patient re-educated about  the importance of commitment to a  minimum of 150 minutes of exercise per week.  The importance of healthy food choices with portion control discussed. Encouraged to start a food diary, count calories and to consider  joining a support group. Sample diet sheets offered. Goals set by the patient for the next several months.   Weight /BMI 11/05/2016 03/12/2016 02/28/2016  WEIGHT 212 lb 1.9 oz 210 lb 1.9 oz 212 lb 6.4 oz  HEIGHT 5\' 5"  5\' 5"  5\' 5"   BMI 35.3 kg/m2 34.97 kg/m2 35.35 kg/m2

## 2016-11-05 NOTE — Patient Instructions (Addendum)
Physical exam  Sept 7 or after, call if you need me sooner  Medications sent in   You will be referred to Nutritionist, who will call to schedule your appointment  It is important that you exercise regularly at least 30 minutes 5 times a week. If you develop chest pain, have severe difficulty breathing, or feel very tired, stop exercising immediately and seek medical attention    50 % of lunch and dinner plaet needs to be plant, which means vegetable or fruit , not corm or potato  We will call with labs or mail letter also look at my chart

## 2016-11-06 ENCOUNTER — Encounter: Payer: Self-pay | Admitting: Family Medicine

## 2016-11-06 LAB — HEMOGLOBIN A1C
Hgb A1c MFr Bld: 6.9 % — ABNORMAL HIGH (ref <5.7)
Mean Plasma Glucose: 151 mg/dL

## 2016-11-06 NOTE — Assessment & Plan Note (Signed)
Symptomatic with abn UA, has recurrent UTI, 3 day course of cipro prescribed wit fluconazole

## 2016-11-06 NOTE — Assessment & Plan Note (Signed)
Controlled, no change in medication  

## 2016-11-06 NOTE — Assessment & Plan Note (Signed)
poitive rsponse to clotrimazole/ betamethasone cream in the past , same prescribed

## 2016-11-06 NOTE — Assessment & Plan Note (Signed)
Controlled, no change in medication DASH diet and commitment to daily physical activity for a minimum of 30 minutes discussed and encouraged, as a part of hypertension management. The importance of attaining a healthy weight is also discussed.  BP/Weight 11/05/2016 03/12/2016 02/28/2016 11/02/2015 08/01/2015 06/06/2015 03/23/2015  Systolic BP 120 118 130 120 124 128 124  Diastolic BP 84 82 100 84 80 78 80  Wt. (Lbs) 212.12 210.12 212.4 206.12 207 204 211  BMI 35.3 34.97 35.35 34.3 34.45 33.95 35.11

## 2016-11-06 NOTE — Assessment & Plan Note (Signed)
Deteriorated. Patient re-educated about  the importance of commitment to a  minimum of 150 minutes of exercise per week.  The importance of healthy food choices with portion control discussed. Encouraged to start a food diary, count calories and to consider  joining a support group. Sample diet sheets offered. Goals set by the patient for the next several months.   Weight /BMI 11/05/2016 03/12/2016 02/28/2016  WEIGHT 212 lb 1.9 oz 210 lb 1.9 oz 212 lb 6.4 oz  HEIGHT 5\' 5"  5\' 5"  5\' 5"   BMI 35.3 kg/m2 34.97 kg/m2 35.35 kg/m2

## 2016-11-06 NOTE — Assessment & Plan Note (Addendum)
Reports increased spasm, continue current baclofen dose , may need to consider dose increase, hopefully with exercise and improved health habits this will improve

## 2016-11-06 NOTE — Assessment & Plan Note (Signed)
Controlled, no change in medication Brittany Nunez is reminded of the importance of commitment to daily physical activity for 30 minutes or more, as able and the need to limit carbohydrate intake to 30 to 60 grams per meal to help with blood sugar control.   The need to take medication as prescribed, test blood sugar as directed, and to call between visits if there is a concern that blood sugar is uncontrolled is also discussed.   Brittany Nunez is reminded of the importance of daily foot exam, annual eye examination, and good blood sugar, blood pressure and cholesterol control.  Diabetic Labs Latest Ref Rng & Units 11/05/2016 02/23/2016 11/02/2015 07/19/2015 12/21/2014  HbA1c <5.7 % 6.9(H) 7.1(H) 6.1(H) 6.3(H) 6.3(H)  Microalbumin Not estab mg/dL - - - <1.6<0.2 -  Micro/Creat Ratio <30 mcg/mg creat - - - SEE NOTE -  Chol <200 mg/dL 109159 - - 604151 540168  HDL >98>50 mg/dL 11(B42(L) - - 14(N35(L) 52  Calc LDL <100 mg/dL 829(F105(H) - - 94 621(H103(H)  Triglycerides <150 mg/dL 58 - - 086110 64  Creatinine 0.50 - 1.05 mg/dL 5.780.88 4.690.93 6.290.96 5.280.93 4.130.95   BP/Weight 11/05/2016 03/12/2016 02/28/2016 11/02/2015 08/01/2015 06/06/2015 03/23/2015  Systolic BP 120 118 130 120 124 128 124  Diastolic BP 84 82 100 84 80 78 80  Wt. (Lbs) 212.12 210.12 212.4 206.12 207 204 211  BMI 35.3 34.97 35.35 34.3 34.45 33.95 35.11   Foot/eye exam completion dates Latest Ref Rng & Units 02/28/2016 02/15/2015  Eye Exam No Retinopathy - -  Foot Form Completion - Done Done

## 2016-11-08 ENCOUNTER — Other Ambulatory Visit: Payer: Self-pay | Admitting: Family Medicine

## 2016-11-08 LAB — URINE CULTURE

## 2016-11-09 ENCOUNTER — Encounter: Payer: Self-pay | Admitting: Family Medicine

## 2016-11-20 ENCOUNTER — Other Ambulatory Visit: Payer: Self-pay | Admitting: Family Medicine

## 2016-11-20 DIAGNOSIS — E119 Type 2 diabetes mellitus without complications: Secondary | ICD-10-CM

## 2016-12-04 ENCOUNTER — Telehealth: Payer: Self-pay | Admitting: Family Medicine

## 2016-12-04 NOTE — Telephone Encounter (Signed)
Patient in office now with a couple of Rx requests. She is completely out of both the metformin and temazepam.  She would like these called in @ [facility] ASAP   ([facility])

## 2016-12-05 ENCOUNTER — Other Ambulatory Visit: Payer: Self-pay

## 2016-12-05 DIAGNOSIS — E119 Type 2 diabetes mellitus without complications: Secondary | ICD-10-CM

## 2016-12-05 MED ORDER — METFORMIN HCL 1000 MG PO TABS: ORAL_TABLET | ORAL | 3 refills | Status: DC

## 2016-12-05 MED ORDER — TEMAZEPAM 30 MG PO CAPS: ORAL_CAPSULE | ORAL | 3 refills | Status: DC

## 2016-12-05 NOTE — Telephone Encounter (Signed)
Sent in

## 2016-12-18 ENCOUNTER — Other Ambulatory Visit: Payer: Self-pay

## 2016-12-18 ENCOUNTER — Telehealth: Payer: Self-pay | Admitting: Family Medicine

## 2016-12-18 MED ORDER — FLUOXETINE HCL 20 MG PO CAPS: 20.0000 mg | ORAL_CAPSULE | Freq: Every day | ORAL | 1 refills | Status: DC

## 2016-12-18 MED ORDER — VENLAFAXINE HCL ER 37.5 MG PO CP24: ORAL_CAPSULE | ORAL | 1 refills | Status: DC

## 2016-12-18 NOTE — Telephone Encounter (Signed)
meds refilled 

## 2016-12-18 NOTE — Telephone Encounter (Signed)
Brittany PikesSusan is requesting refill on Fluoxetine 20 mg and Venlafaxine er 37.5 mg [facility]) please advise?

## 2016-12-25 ENCOUNTER — Telehealth: Payer: Self-pay | Admitting: *Deleted

## 2016-12-25 NOTE — Telephone Encounter (Signed)
Patient called stating she needs the acute check clicker to be refilled, patient states it is the one that has the needle in them [facility]

## 2016-12-27 MED ORDER — ACCU-CHEK FASTCLIX LANCETS MISC: 2 refills | Status: DC

## 2016-12-27 NOTE — Telephone Encounter (Signed)
Rx sent to pharm

## 2017-01-23 ENCOUNTER — Other Ambulatory Visit: Payer: Self-pay | Admitting: Family Medicine

## 2017-02-21 ENCOUNTER — Other Ambulatory Visit: Payer: Self-pay | Admitting: Family Medicine

## 2017-02-26 ENCOUNTER — Telehealth: Payer: Self-pay | Admitting: Family Medicine

## 2017-02-26 ENCOUNTER — Other Ambulatory Visit: Payer: Self-pay | Admitting: *Deleted

## 2017-02-26 MED ORDER — BENAZEPRIL HCL 10 MG PO TABS: 10.0000 mg | ORAL_TABLET | Freq: Every day | ORAL | 1 refills | Status: DC

## 2017-02-26 NOTE — Telephone Encounter (Signed)
Patient is requesting the following Rx to be called in at [facility]  [place] [facility]   Omeprazole 20 mg cap Benazepril 10mg 

## 2017-02-26 NOTE — Telephone Encounter (Signed)
Sent as requested.

## 2017-02-28 ENCOUNTER — Other Ambulatory Visit: Payer: Self-pay | Admitting: *Deleted

## 2017-02-28 MED ORDER — OMEPRAZOLE 20 MG PO CPDR: DELAYED_RELEASE_CAPSULE | ORAL | 5 refills | Status: DC

## 2017-03-04 ENCOUNTER — Encounter: Payer: Self-pay | Admitting: Family Medicine

## 2017-03-04 ENCOUNTER — Ambulatory Visit (INDEPENDENT_AMBULATORY_CARE_PROVIDER_SITE_OTHER): Payer: BLUE CROSS/BLUE SHIELD | Admitting: Family Medicine

## 2017-03-04 VITALS — BP 170/100 | HR 104 | Temp 98.0°F | Resp 16 | Ht 65.0 in | Wt 223.0 lb

## 2017-03-04 DIAGNOSIS — I10 Essential (primary) hypertension: Secondary | ICD-10-CM | POA: Diagnosis not present

## 2017-03-04 DIAGNOSIS — E119 Type 2 diabetes mellitus without complications: Secondary | ICD-10-CM

## 2017-03-04 DIAGNOSIS — Z0000 Encounter for general adult medical examination without abnormal findings: Secondary | ICD-10-CM | POA: Diagnosis not present

## 2017-03-04 DIAGNOSIS — M5440 Lumbago with sciatica, unspecified side: Secondary | ICD-10-CM | POA: Diagnosis not present

## 2017-03-04 DIAGNOSIS — Z23 Encounter for immunization: Secondary | ICD-10-CM

## 2017-03-04 MED ORDER — PREDNISONE 5 MG PO TABS
5.0000 mg | ORAL_TABLET | Freq: Two times a day (BID) | ORAL | 0 refills | Status: AC
Start: 2017-03-04 — End: 2017-03-08

## 2017-03-04 MED ORDER — AMLODIPINE BESYLATE 5 MG PO TABS: 5.0000 mg | ORAL_TABLET | Freq: Every day | ORAL | 3 refills | Status: DC

## 2017-03-04 MED ORDER — BLOOD GLUCOSE METER KIT: PACK | 0 refills | Status: AC

## 2017-03-04 MED ORDER — METHYLPREDNISOLONE ACETATE 80 MG/ML IJ SUSP
80.0000 mg | Freq: Once | INTRAMUSCULAR | Status: AC
  Administered 2017-03-04: 80 mg via INTRAMUSCULAR

## 2017-03-04 NOTE — Patient Instructions (Addendum)
MD f/u in 6 to 7 weeks  Flu vaccine today'  Foot exam is good    HBA1C , chem 7 and EGFR and microalb today   Depo medrol 80 mg iM in office for back  pain and 5 day course of prednisone sent in   Stop benazepril due to side effect  New for blood pressure is amlodipine  5 mg daily  Need to change to pLANT based eating on a regular schedule limitportions, ea between 7am and 7 pm, need to lose weight   Please schedule your eye exam and mammogram  I have sent  A message to  The nutritionist regarding the fact that you have not been called  Thank you  for choosing [facility]. We consider it a privelige to serve you.  Delivering excellent health care in a caring and  compassionate way is our goal.  Partnering with you,  so that together we can achieve this goal is our strategy.

## 2017-03-04 NOTE — Assessment & Plan Note (Signed)
Annual exam as documented. Counseling done  re healthy lifestyle involving commitment to 150 minutes exercise per week, heart healthy diet, and attaining healthy weight.The importance of adequate sleep also discussed.  Immunization and cancer screening needs are specifically addressed at this visit.  

## 2017-03-05 ENCOUNTER — Telehealth: Payer: Self-pay | Admitting: Nutrition

## 2017-03-05 ENCOUNTER — Encounter: Payer: Self-pay | Admitting: Family Medicine

## 2017-03-05 NOTE — Progress Notes (Signed)
Brittany Nunez     MRN: [id number]      DOB: 06/29/1960  HPI: Patient is in for annual physical exam. 3 day h/o increased back pain, no aggravating factor New diagnosis of hypertension, and marked weight gain driven by poor food choice and lack of exercise Immunization is reviewed , and  updated    PE: BP (!) 170/100   Pulse (!) 104   Temp 98 F (36.7 C) (Other (Comment))   Resp 16   Ht 5\' 5"  (1.651 m)   Wt 223 lb (101.2 kg)   SpO2 98%   BMI 37.11 kg/m   Pleasant  female, alert and oriented x 3, in no cardio-pulmonary distress. Afebrile. HEENT No facial trauma or asymetry. Sinuses non tender.  Extra occullar muscles intact, . External ears normal, tympanic membranes clear. Oropharynx moist, no exudate. Neck: supple, no adenopathy,JVD or thyromegaly.No bruits.  Chest: Clear to ascultation bilaterally.No crackles or wheezes. Non tender to palpation  Breast: No asymetry,no masses or lumps. No tenderness. No nipple discharge or inversion. No axillary or supraclavicular adenopathy  Cardiovascular system; Heart sounds normal,  S1 and  S2 ,no S3.  No murmur, or thrill. Apical beat not displaced Peripheral pulses normal.  Abdomen: Soft, non tender, no organomegaly or masses. No bruits. Bowel sounds normal. No guarding, tenderness or rebound.  Rectal:  Normal sphincter tone. No rectal mass. Guaiac negative stool.  GU: Not examined, asymptomatic   Musculoskeletal exam: Decreased  ROM of lumbar  spine,  Normal in hips , shoulders and knees. No deformity ,swelling or crepitus noted. No muscle wasting or atrophy.   Neurologic: Cranial nerves 2 to 12 intact. Power, tone ,sensation and reflexes normal throughout. No disturbance in gait. No tremor.  Skin: Intact, no ulceration, erythema , scaling or rash noted. Pigmentation normal throughout  Psych; Normal mood and affect. Judgement and concentration normal   Assessment & Plan:  Annual physical  exam Annual exam as documented. Counseling done  re healthy lifestyle involving commitment to 150 minutes exercise per week, heart healthy diet, and attaining healthy weight.The importance of adequate sleep also discussed. . Immunization and cancer screening needs are specifically addressed at this visit.   Back pain Uncontrolled.epo medrol administered IM in the office , to be followed by a short course of oral prednisone .   Controlled type 2 diabetes mellitus without complication (HCC) Updated lab needed at/ before next visit. Brittany Nunez is reminded of the importance of commitment to daily physical activity for 30 minutes or more, as able and the need to limit carbohydrate intake to 30 to 60 grams per meal to help with blood sugar control.   The need to take medication as prescribed, test blood sugar as directed, and to call between visits if there is a concern that blood sugar is uncontrolled is also discussed.   Brittany Nunez is reminded of the importance of daily foot exam, annual eye examination, and good blood sugar, blood pressure and cholesterol control.  Diabetic Labs Latest Ref Rng & Units 11/05/2016 02/23/2016 11/02/2015 07/19/2015 12/21/2014  HbA1c <5.7 % 6.9(H) 7.1(H) 6.1(H) 6.3(H) 6.3(H)  Microalbumin Not estab mg/dL - - - <4.0<0.2 -  Micro/Creat Ratio <30 mcg/mg creat - - - SEE NOTE -  Chol <200 mg/dL 981159 - - 191151 478168  HDL >29>50 mg/dL 56(O42(L) - - 13(Y35(L) 52  Calc LDL <100 mg/dL 865(H105(H) - - 94 846(N103(H)  Triglycerides <150 mg/dL 58 - - 629110 64  Creatinine 0.50 - 1.05  mg/dL 0.98 1.19 1.47 8.29 5.62   BP/Weight 03/04/2017 11/05/2016 03/12/2016 02/28/2016 11/02/2015 08/01/2015 06/06/2015  Systolic BP 170 120 118 130 120 124 128  Diastolic BP 100 84 82 100 84 80 78  Wt. (Lbs) 223 212.12 210.12 212.4 206.12 207 204  BMI 37.11 35.3 34.97 35.35 34.3 34.45 33.95   Foot/eye exam completion dates Latest Ref Rng & Units 03/04/2017 02/28/2016  Eye Exam No Retinopathy - -  Foot Form Completion - Done Done  Pt to  schedule ey exam       Essential hypertension Uncontrolled with markedly elevated blood pressure. Needs to commit to medication, stopped benazepril, states it caused cramps, also she has gained 12 pounds since her last visit DASH diet and commitment to daily physical activity for a minimum of 30 minutes discussed and encouraged, as a part of hypertension management. The importance of attaining a healthy weight is also discussed.  BP/Weight 03/04/2017 11/05/2016 03/12/2016 02/28/2016 11/02/2015 08/01/2015 06/06/2015  Systolic BP 170 120 118 130 120 124 128  Diastolic BP 100 84 82 100 84 80 78  Wt. (Lbs) 223 212.12 210.12 212.4 206.12 207 204  BMI 37.11 35.3 34.97 35.35 34.3 34.45 33.95     Amlodipine prescribed  Morbid obesity (HCC) Deteriorated. Patient re-educated about  the importance of commitment to a  minimum of 150 minutes of exercise per week.  The importance of healthy food choices with portion control discussed. Encouraged to start a food diary, count calories and to consider  joining a support group. Sample diet sheets offered. Goals set by the patient for the next several months.   Weight /BMI 03/04/2017 11/05/2016 03/12/2016  WEIGHT 223 lb 212 lb 1.9 oz 210 lb 1.9 oz  HEIGHT     BMI 37.11 kg/m2 35.3 kg/m2 34.97 kg/m2   Message to diabetic educator

## 2017-03-05 NOTE — Assessment & Plan Note (Signed)
Deteriorated. Patient re-educated about  the importance of commitment to a  minimum of 150 minutes of exercise per week.  The importance of healthy food choices with portion control discussed. Encouraged to start a food diary, count calories and to consider  joining a support group. Sample diet sheets offered. Goals set by the patient for the next several months.   Weight /BMI 03/04/2017 11/05/2016 03/12/2016  WEIGHT 223 lb 212 lb 1.9 oz 210 lb 1.9 oz  HEIGHT 5\' 5"  5\' 5"  5\' 5"   BMI 37.11 kg/m2 35.3 kg/m2 34.97 kg/m2   Message to diabetic educator

## 2017-03-05 NOTE — Assessment & Plan Note (Signed)
Updated lab needed at/ before next visit. Brittany Nunez is reminded of the importance of commitment to daily physical activity for 30 minutes or more, as able and the need to limit carbohydrate intake to 30 to 60 grams per meal to help with blood sugar control.   The need to take medication as prescribed, test blood sugar as directed, and to call between visits if there is a concern that blood sugar is uncontrolled is also discussed.   Brittany Nunez is reminded of the importance of daily foot exam, annual eye examination, and good blood sugar, blood pressure and cholesterol control.  Diabetic Labs Latest Ref Rng & Units 11/05/2016 02/23/2016 11/02/2015 07/19/2015 12/21/2014  HbA1c <5.7 % 6.9(H) 7.1(H) 6.1(H) 6.3(H) 6.3(H)  Microalbumin Not estab mg/dL - - - <6.5<0.2 -  Micro/Creat Ratio <30 mcg/mg creat - - - SEE NOTE -  Chol <200 mg/dL 784159 - - 696151 295168  HDL >28>50 mg/dL 41(L42(L) - - 24(M35(L) 52  Calc LDL <100 mg/dL 010(U105(H) - - 94 725(D103(H)  Triglycerides <150 mg/dL 58 - - 664110 64  Creatinine 0.50 - 1.05 mg/dL 4.030.88 4.740.93 2.590.96 5.630.93 8.750.95   BP/Weight 03/04/2017 11/05/2016 03/12/2016 02/28/2016 11/02/2015 08/01/2015 06/06/2015  Systolic BP 170 120 118 130 120 124 128  Diastolic BP 100 84 82 100 84 80 78  Wt. (Lbs) 223 212.12 210.12 212.4 206.12 207 204  BMI 37.11 35.3 34.97 35.35 34.3 34.45 33.95   Foot/eye exam completion dates Latest Ref Rng & Units 03/04/2017 02/28/2016  Eye Exam No Retinopathy - -  Foot Form Completion - Done Done  Pt to schedule ey exam

## 2017-03-05 NOTE — Telephone Encounter (Signed)
NO vm set up to leave messsage to schedule appt.

## 2017-03-05 NOTE — Assessment & Plan Note (Signed)
Uncontrolled with markedly elevated blood pressure. Needs to commit to medication, stopped benazepril, states it caused cramps, also she has gained 12 pounds since her last visit DASH diet and commitment to daily physical activity for a minimum of 30 minutes discussed and encouraged, as a part of hypertension management. The importance of attaining a healthy weight is also discussed.  BP/Weight 03/04/2017 11/05/2016 03/12/2016 02/28/2016 11/02/2015 08/01/2015 06/06/2015  Systolic BP 170 120 118 130 120 124 128  Diastolic BP 100 84 82 100 84 80 78  Wt. (Lbs) 223 212.12 210.12 212.4 206.12 207 204  BMI 37.11 35.3 34.97 35.35 34.3 34.45 33.95     Amlodipine prescribed

## 2017-03-05 NOTE — Assessment & Plan Note (Signed)
Uncontrolled.epo medrol administered IM in the office , to be followed by a short course of oral prednisone .

## 2017-04-03 ENCOUNTER — Other Ambulatory Visit: Payer: Self-pay | Admitting: Family Medicine

## 2017-04-04 ENCOUNTER — Other Ambulatory Visit: Payer: Self-pay | Admitting: Family Medicine

## 2017-04-08 NOTE — Telephone Encounter (Signed)
Seen 9 10 18 

## 2017-04-25 DIAGNOSIS — I10 Essential (primary) hypertension: Secondary | ICD-10-CM | POA: Diagnosis not present

## 2017-04-25 DIAGNOSIS — E119 Type 2 diabetes mellitus without complications: Secondary | ICD-10-CM | POA: Diagnosis not present

## 2017-04-26 LAB — COMPLETE METABOLIC PANEL WITH GFR
AG RATIO: 1.3 (calc) (ref 1.0–2.5)
ALKALINE PHOSPHATASE (APISO): 87 U/L (ref 33–130)
ALT: 25 U/L (ref 6–29)
AST: 19 U/L (ref 10–35)
Albumin: 3.9 g/dL (ref 3.6–5.1)
BUN: 15 mg/dL (ref 7–25)
CO2: 29 mmol/L (ref 20–32)
CREATININE: 0.93 mg/dL (ref 0.50–1.05)
Calcium: 9.1 mg/dL (ref 8.6–10.4)
Chloride: 105 mmol/L (ref 98–110)
GFR, Est African American: 79 mL/min/{1.73_m2} (ref >=60)
GFR, Est Non African American: 68 mL/min/{1.73_m2} (ref >=60)
GLOBULIN: 3.1 g/dL (ref 1.9–3.7)
Glucose, Bld: 99 mg/dL (ref 65–99)
POTASSIUM: 4.9 mmol/L (ref 3.5–5.3)
SODIUM: 142 mmol/L (ref 135–146)
Total Bilirubin: 0.3 mg/dL (ref 0.2–1.2)
Total Protein: 7 g/dL (ref 6.1–8.1)

## 2017-04-26 LAB — MICROALBUMIN / CREATININE URINE RATIO
Creatinine, Urine: 144 mg/dL (ref 20–275)
Microalb Creat Ratio: 6 mcg/mg creat (ref <30)
Microalb, Ur: 0.9 mg/dL

## 2017-04-26 LAB — HEMOGLOBIN A1C
HEMOGLOBIN A1C: 6.7 %{Hb} — AB (ref <5.7)
Mean Plasma Glucose: 146 (calc)
eAG (mmol/L): 8.1 (calc)

## 2017-04-30 ENCOUNTER — Encounter: Payer: Self-pay | Admitting: Family Medicine

## 2017-04-30 ENCOUNTER — Ambulatory Visit (INDEPENDENT_AMBULATORY_CARE_PROVIDER_SITE_OTHER): Payer: BLUE CROSS/BLUE SHIELD | Admitting: Family Medicine

## 2017-04-30 VITALS — BP 110/80 | HR 93 | Resp 16 | Ht 65.0 in | Wt 196.0 lb

## 2017-04-30 DIAGNOSIS — M5440 Lumbago with sciatica, unspecified side: Secondary | ICD-10-CM

## 2017-04-30 DIAGNOSIS — E119 Type 2 diabetes mellitus without complications: Secondary | ICD-10-CM

## 2017-04-30 DIAGNOSIS — Z1231 Encounter for screening mammogram for malignant neoplasm of breast: Secondary | ICD-10-CM

## 2017-04-30 DIAGNOSIS — D508 Other iron deficiency anemias: Secondary | ICD-10-CM

## 2017-04-30 DIAGNOSIS — G8929 Other chronic pain: Secondary | ICD-10-CM | POA: Diagnosis not present

## 2017-04-30 DIAGNOSIS — I10 Essential (primary) hypertension: Secondary | ICD-10-CM

## 2017-04-30 DIAGNOSIS — B3731 Acute candidiasis of vulva and vagina: Secondary | ICD-10-CM | POA: Insufficient documentation

## 2017-04-30 DIAGNOSIS — E559 Vitamin D deficiency, unspecified: Secondary | ICD-10-CM | POA: Diagnosis not present

## 2017-04-30 DIAGNOSIS — B373 Candidiasis of vulva and vagina: Secondary | ICD-10-CM | POA: Diagnosis not present

## 2017-04-30 DIAGNOSIS — E669 Obesity, unspecified: Secondary | ICD-10-CM | POA: Diagnosis not present

## 2017-04-30 MED ORDER — FLUCONAZOLE 150 MG PO TABS: 150.0000 mg | ORAL_TABLET | Freq: Once | ORAL | 0 refills | Status: AC

## 2017-04-30 NOTE — Assessment & Plan Note (Signed)
3 week h/o yellow d/c , no odor , no itch, yellow, pt opts to treat empirically and will call back if symptoms persist

## 2017-04-30 NOTE — Patient Instructions (Signed)
F/u in 3.5 months, call if you need me sooner  CONGRATS!!!!   Please sched and keep mammogram appt , past due!  It is important that you exercise regularly at least 30 minutes 5 times a week. If you develop chest pain, have severe difficulty breathing, or feel very tired, stop exercising immediately and seek medical attention   Medication sent to local pharmacy as discussed   Fasting CBC, TSH, vit D, lipid, cmp and EGFR, HBA1C  1 week before next visit   Thank you  for choosing [facility]. We consider it a privelige to serve you.  Delivering excellent health care in a caring and  compassionate way is our goal.  Partnering with you,  so that together we can achieve this goal is our strategy.

## 2017-05-02 ENCOUNTER — Other Ambulatory Visit: Payer: Self-pay | Admitting: Family Medicine

## 2017-05-02 NOTE — Telephone Encounter (Signed)
Seen 11 6 18 

## 2017-05-04 ENCOUNTER — Encounter: Payer: Self-pay | Admitting: Family Medicine

## 2017-05-04 DIAGNOSIS — E669 Obesity, unspecified: Secondary | ICD-10-CM | POA: Insufficient documentation

## 2017-05-04 DIAGNOSIS — E663 Overweight: Secondary | ICD-10-CM | POA: Insufficient documentation

## 2017-05-04 NOTE — Assessment & Plan Note (Signed)
Improved. Patient re-educated about  the importance of commitment to a  minimum of 150 minutes of exercise per week.  The importance of healthy food choices with portion control discussed. Encouraged to start a food diary, count calories and to consider  joining a support group. Sample diet sheets offered. Goals set by the patient for the next several months.   Weight /BMI 04/30/2017 03/04/2017 11/05/2016  WEIGHT 196 lb 223 lb 212 lb 1.9 oz  HEIGHT 5\' 5"  5\' 5"  5\' 5"   BMI 32.62 kg/m2 37.11 kg/m2 35.3 kg/m2

## 2017-05-04 NOTE — Assessment & Plan Note (Signed)
Controlled no change inmmanagement

## 2017-05-04 NOTE — Assessment & Plan Note (Signed)
Brittany Nunez is reminded of the importance of commitment to daily physical activity for 30 minutes or more, as able and the need to limit carbohydrate intake to 30 to 60 grams per meal to help with blood sugar control.   The need to take medication as prescribed, test blood sugar as directed, and to call between visits if there is a concern that blood sugar is uncontrolled is also discussed.   Brittany Nunez is reminded of the importance of daily foot exam, annual eye examination, and good blood sugar, blood pressure and cholesterol control.  Diabetic Labs Latest Ref Rng & Units 04/25/2017 11/05/2016 02/23/2016 11/02/2015 07/19/2015  HbA1c <5.7 % of total Hgb 6.7(H) 6.9(H) 7.1(H) 6.1(H) 6.3(H)  Microalbumin mg/dL 0.9 - - - <1.6<0.2  Micro/Creat Ratio <30 mcg/mg creat 6 - - - SEE NOTE  Chol <200 mg/dL - 109159 - - 604151  HDL >54>50 mg/dL - 09(W42(L) - - 11(B35(L)  Calc LDL <100 mg/dL - 147(W105(H) - - 94  Triglycerides <150 mg/dL - 58 - - 295110  Creatinine 0.50 - 1.05 mg/dL 6.210.93 3.080.88 6.570.93 8.460.96 9.620.93   BP/Weight 04/30/2017 03/04/2017 11/05/2016 03/12/2016 02/28/2016 11/02/2015 08/01/2015  Systolic BP 110 170 120 118 130 120 124  Diastolic BP 80 100 84 82 100 84 80  Wt. (Lbs) 196 223 212.12 210.12 212.4 206.12 207  BMI 32.62 37.11 35.3 34.97 35.35 34.3 34.45   Foot/eye exam completion dates Latest Ref Rng & Units 03/04/2017 02/28/2016  Eye Exam No Retinopathy - -  Foot Form Completion - Done Done

## 2017-05-04 NOTE — Assessment & Plan Note (Signed)
Controlled, no change in medication DASH diet and commitment to daily physical activity for a minimum of 30 minutes discussed and encouraged, as a part of hypertension management. The importance of attaining a healthy weight is also discussed.  BP/Weight 04/30/2017 03/04/2017 11/05/2016 03/12/2016 02/28/2016 11/02/2015 08/01/2015  Systolic BP 110 170 120 118 130 120 124  Diastolic BP 80 100 84 82 100 84 80  Wt. (Lbs) 196 223 212.12 210.12 212.4 206.12 207  BMI 32.62 37.11 35.3 34.97 35.35 34.3 34.45

## 2017-05-04 NOTE — Progress Notes (Signed)
Brittany Nunez     MRN: [id number]      DOB: 04-11-1961   HPI [name] is here for follow up and re-evaluation of chronic medical conditions, medication management and review of any available recent lab and radiology data.  Preventive health is updated, specifically  Cancer screening and Immunization.   Questions or concerns regarding consultations or procedures which the PT has had in the interim are  addressed. The PT denies any adverse reactions to current medications since the last visit.  C/o tender lesion behind left ear , hurts with direct pressure C/o  Thick white vaginal d/c , no odor  Excellent weight loss and reports FBG less than 120 , has changed diet , cut out sweets and sodas, has started some exercise  ROS Denies recent fever or chills. Denies sinus pressure, nasal congestion, ear pain or sore throat. Denies chest congestion, productive cough or wheezing. Denies chest pains, palpitations and leg swelling Denies abdominal pain, nausea, vomiting,diarrhea or constipation.   Denies dysuria, frequency, hesitancy or incontinence. Denies joint pain, swelling and limitation in mobility. Denies headaches, seizures, numbness, or tingling. Denies depression, anxiety or insomnia. Denies skin break down or rash.   PE  BP 110/80   Pulse 93   Resp 16   Ht 5\' 5"  (1.651 m)   Wt 196 lb (88.9 kg)   SpO2 96%   BMI 32.62 kg/m   Patient alert and oriented and in no cardiopulmonary distress.  HEENT: No facial asymmetry, EOMI,   oropharynx pink and moist.  Neck supple no JVD, no mass.  Chest: Clear to auscultation bilaterally.  CVS: S1, S2 no murmurs, no S3.Regular rate.  ABD: Soft non tender.  Pelvic:no examined, will treat based on history for yeast, specimens for submission later if needed  Ext: No edema  MS: Adequate ROM spine, shoulders, hips and knees.  Skin: Intact, no ulcerations or rash noted.Cysic nodule , pea sized behind left ear, not warm or  erythematous  Psych: Good eye contact, normal affect. Memory intact not anxious or depressed appearing.  CNS: CN 2-12 intact, power,  normal throughout.no focal deficits noted.   Assessment & Plan  Vulvovaginal candidiasis 3 week h/o yellow d/c , no odor , no itch, yellow, pt opts to treat empirically and will call back if symptoms persist  Essential hypertension Controlled, no change in medication DASH diet and commitment to daily physical activity for a minimum of 30 minutes discussed and encouraged, as a part of hypertension management. The importance of attaining a healthy weight is also discussed.  BP/Weight 04/30/2017 03/04/2017 11/05/2016 03/12/2016 02/28/2016 11/02/2015 08/01/2015  Systolic BP 110 170 120 118 130 120 124  Diastolic BP 80 100 84 82 100 84 80  Wt. (Lbs) 196 223 212.12 210.12 212.4 206.12 207  BMI 32.62 37.11 35.3 34.97 35.35 34.3 34.45       Controlled type 2 diabetes mellitus without complication [facility](HCC) [name] is reminded of the importance of commitment to daily physical activity for 30 minutes or more, as able and the need to limit carbohydrate intake to 30 to 60 grams per meal to help with blood sugar control.   The need to take medication as prescribed, test blood sugar as directed, and to call between visits if there is a concern that blood sugar is uncontrolled is also discussed.   [name] is reminded of the importance of daily foot exam, annual eye examination, and good blood sugar, blood pressure and cholesterol control.  Diabetic Labs Latest Ref Rng & Units 04/25/2017 11/05/2016 02/23/2016 11/02/2015 07/19/2015  HbA1c <5.7 % of total Hgb 6.7(H) 6.9(H) 7.1(H) 6.1(H) 6.3(H)  Microalbumin mg/dL 0.9 - - - <1.6<0.2  Micro/Creat Ratio <30 mcg/mg creat 6 - - - SEE NOTE  Chol <200 mg/dL - 109159 - - 604151  HDL >54>50 mg/dL - 09(W42(L) - - 11(B35(L)  Calc LDL <100 mg/dL - 147(W105(H) - - 94  Triglycerides <150 mg/dL - 58 - - 295110  Creatinine 0.50 - 1.05 mg/dL 6.210.93 3.080.88 6.570.93 8.460.96 9.620.93    BP/Weight 04/30/2017 03/04/2017 11/05/2016 03/12/2016 02/28/2016 11/02/2015 08/01/2015  Systolic BP 110 170 120 118 130 120 124  Diastolic BP 80 100 84 82 100 84 80  Wt. (Lbs) 196 223 212.12 210.12 212.4 206.12 207  BMI 32.62 37.11 35.3 34.97 35.35 34.3 34.45   Foot/eye exam completion dates Latest Ref Rng & Units 03/04/2017 02/28/2016  Eye Exam No Retinopathy - -  Foot Form Completion - Done Done        Back pain Controlled no change inmmanagement  Morbid obesity (HCC) Improved. Patient re-educated about  the importance of commitment to a  minimum of 150 minutes of exercise per week.  The importance of healthy food choices with portion control discussed. Encouraged to start a food diary, count calories and to consider  joining a support group. Sample diet sheets offered. Goals set by the patient for the next several months.   Weight /BMI 04/30/2017 03/04/2017 11/05/2016  WEIGHT 196 lb 223 lb 212 lb 1.9 oz  HEIGHT 5\' 5"  5\' 5"  5\' 5"   BMI 32.62 kg/m2 37.11 kg/m2 35.3 kg/m2

## 2017-05-07 ENCOUNTER — Other Ambulatory Visit: Payer: Self-pay | Admitting: Family Medicine

## 2017-05-07 DIAGNOSIS — E119 Type 2 diabetes mellitus without complications: Secondary | ICD-10-CM

## 2017-05-10 ENCOUNTER — Telehealth: Payer: Self-pay | Admitting: Family Medicine

## 2017-05-10 NOTE — Telephone Encounter (Signed)
PATIENT IS REQUESTING REFILL FOR OMEPRAZOLE 20MG   PLEASE SEND TO [facility] IN [place] *SHE IS NO LONGER USING [facility]. CB# [phone]

## 2017-05-13 ENCOUNTER — Other Ambulatory Visit: Payer: Self-pay

## 2017-05-13 MED ORDER — OMEPRAZOLE 20 MG PO CPDR: DELAYED_RELEASE_CAPSULE | ORAL | 5 refills | Status: DC

## 2017-05-13 NOTE — Telephone Encounter (Signed)
MED SENT IN 

## 2017-06-06 ENCOUNTER — Other Ambulatory Visit: Payer: Self-pay | Admitting: Family Medicine

## 2017-06-07 ENCOUNTER — Other Ambulatory Visit: Payer: Self-pay | Admitting: Family Medicine

## 2017-06-07 MED ORDER — TEMAZEPAM 30 MG PO CAPS: 30.0000 mg | ORAL_CAPSULE | Freq: Every evening | ORAL | 5 refills | Status: DC | PRN

## 2017-06-14 ENCOUNTER — Other Ambulatory Visit: Payer: Self-pay | Admitting: Family Medicine

## 2017-07-13 ENCOUNTER — Other Ambulatory Visit: Payer: Self-pay | Admitting: Family Medicine

## 2017-07-15 ENCOUNTER — Other Ambulatory Visit: Payer: Self-pay | Admitting: Family Medicine

## 2017-07-15 ENCOUNTER — Ambulatory Visit (HOSPITAL_COMMUNITY)
Admission: RE | Admit: 2017-07-15 | Discharge: 2017-07-15 | Disposition: A | Discharge status: Home / Self Care | Payer: BLUE CROSS/BLUE SHIELD | Source: Ambulatory Visit | Attending: Family Medicine | Admitting: Family Medicine

## 2017-07-15 ENCOUNTER — Encounter (HOSPITAL_COMMUNITY): Payer: Self-pay

## 2017-07-15 DIAGNOSIS — Z1231 Encounter for screening mammogram for malignant neoplasm of breast: Secondary | ICD-10-CM

## 2017-07-18 ENCOUNTER — Other Ambulatory Visit: Payer: Self-pay | Admitting: Family Medicine

## 2017-07-18 MED ORDER — TEMAZEPAM 30 MG PO CAPS: 30.0000 mg | ORAL_CAPSULE | Freq: Every evening | ORAL | 5 refills | Status: DC | PRN

## 2017-08-01 ENCOUNTER — Ambulatory Visit: Payer: BLUE CROSS/BLUE SHIELD | Admitting: Family Medicine

## 2017-08-09 DIAGNOSIS — D508 Other iron deficiency anemias: Secondary | ICD-10-CM | POA: Diagnosis not present

## 2017-08-09 DIAGNOSIS — E119 Type 2 diabetes mellitus without complications: Secondary | ICD-10-CM | POA: Diagnosis not present

## 2017-08-10 LAB — COMPLETE METABOLIC PANEL WITH GFR
AG Ratio: 1.3 (calc) (ref 1.0–2.5)
ALKALINE PHOSPHATASE (APISO): 88 U/L (ref 33–130)
ALT: 16 U/L (ref 6–29)
AST: 15 U/L (ref 10–35)
Albumin: 3.7 g/dL (ref 3.6–5.1)
BUN: 9 mg/dL (ref 7–25)
CO2: 27 mmol/L (ref 20–32)
CREATININE: 0.93 mg/dL (ref 0.50–1.05)
Calcium: 8.8 mg/dL (ref 8.6–10.4)
Chloride: 106 mmol/L (ref 98–110)
GFR, Est African American: 79 mL/min/{1.73_m2} (ref >=60)
GFR, Est Non African American: 68 mL/min/{1.73_m2} (ref >=60)
GLOBULIN: 2.9 g/dL (ref 1.9–3.7)
GLUCOSE: 106 mg/dL — AB (ref 65–99)
POTASSIUM: 4.2 mmol/L (ref 3.5–5.3)
SODIUM: 142 mmol/L (ref 135–146)
Total Bilirubin: 0.3 mg/dL (ref 0.2–1.2)
Total Protein: 6.6 g/dL (ref 6.1–8.1)

## 2017-08-10 LAB — CBC
HEMATOCRIT: 33.2 % — AB (ref 35.0–45.0)
Hemoglobin: 11 g/dL — ABNORMAL LOW (ref 11.7–15.5)
MCH: 25.6 pg — ABNORMAL LOW (ref 27.0–33.0)
MCHC: 33.1 g/dL (ref 32.0–36.0)
MCV: 77.2 fL — ABNORMAL LOW (ref 80.0–100.0)
MPV: 9.2 fL (ref 7.5–12.5)
PLATELETS: 402 10*3/uL — AB (ref 140–400)
RBC: 4.3 10*6/uL (ref 3.80–5.10)
RDW: 14.4 % (ref 11.0–15.0)
WBC: 5.8 10*3/uL (ref 3.8–10.8)

## 2017-08-10 LAB — TSH: TSH: 2.06 mIU/L (ref 0.40–4.50)

## 2017-08-10 LAB — LIPID PANEL
CHOL/HDL RATIO: 3.8 (calc) (ref <5.0)
CHOLESTEROL: 155 mg/dL (ref <200)
HDL: 41 mg/dL — AB (ref >50)
LDL Cholesterol (Calc): 98 mg/dL (calc)
Non-HDL Cholesterol (Calc): 114 mg/dL (calc) (ref <130)
Triglycerides: 70 mg/dL (ref <150)

## 2017-08-10 LAB — VITAMIN D 25 HYDROXY (VIT D DEFICIENCY, FRACTURES): Vit D, 25-Hydroxy: 17 ng/mL — ABNORMAL LOW (ref 30–100)

## 2017-08-10 LAB — HEMOGLOBIN A1C
EAG (MMOL/L): 7.6 (calc)
HEMOGLOBIN A1C: 6.4 %{Hb} — AB (ref <5.7)
Mean Plasma Glucose: 137 (calc)

## 2017-08-13 ENCOUNTER — Other Ambulatory Visit: Payer: Self-pay | Admitting: Family Medicine

## 2017-09-16 ENCOUNTER — Ambulatory Visit (INDEPENDENT_AMBULATORY_CARE_PROVIDER_SITE_OTHER): Payer: BLUE CROSS/BLUE SHIELD | Admitting: Family Medicine

## 2017-09-16 ENCOUNTER — Encounter: Payer: Self-pay | Admitting: Family Medicine

## 2017-09-16 VITALS — BP 110/78 | HR 66 | Resp 16 | Ht 65.0 in | Wt 190.0 lb

## 2017-09-16 DIAGNOSIS — E119 Type 2 diabetes mellitus without complications: Secondary | ICD-10-CM

## 2017-09-16 DIAGNOSIS — M5440 Lumbago with sciatica, unspecified side: Secondary | ICD-10-CM | POA: Diagnosis not present

## 2017-09-16 DIAGNOSIS — I10 Essential (primary) hypertension: Secondary | ICD-10-CM | POA: Diagnosis not present

## 2017-09-16 DIAGNOSIS — E669 Obesity, unspecified: Secondary | ICD-10-CM | POA: Diagnosis not present

## 2017-09-16 DIAGNOSIS — G8929 Other chronic pain: Secondary | ICD-10-CM | POA: Diagnosis not present

## 2017-09-16 DIAGNOSIS — F3289 Other specified depressive episodes: Secondary | ICD-10-CM

## 2017-09-16 MED ORDER — TEMAZEPAM 30 MG PO CAPS: 30.0000 mg | ORAL_CAPSULE | Freq: Every day | ORAL | 5 refills | Status: DC

## 2017-09-16 NOTE — Patient Instructions (Addendum)
Wellness visit with nurse nurse in 4 months  MD visit early October and labs 1 week before  Please start OTC iron 325 mg one twice daily  Please start OTC vitamin D3 2000 IU one daily  Send alternative for gabapentin, the list please or you may want to check cost of out of pocket   Please return 3 stool cards to ensure no blood in stool since you are anemic  Excellent blood pressure, sugar and weight loss, also cholesterol, keep all up  Call about eye Doc you chose , you can actually schedule as you have private ins   MD visit in early October, call if you need me sooner  Non fasting HBa1C, chem 7 and EGFr, CBC , iron and ferritin  It is important that you exercise regularly at least 30 minutes 5 times a week. If you develop chest pain, have severe difficulty breathing, or feel very tired, stop exercising immediately and seek medical attention    Thank you  for choosing [facility]. We consider it a privelige to serve you.  Delivering excellent health care in a caring and  compassionate way is our goal.  Partnering with you,  so that together we can achieve this goal is our strategy.

## 2017-09-16 NOTE — Progress Notes (Signed)
Dm  ? ?

## 2017-09-22 NOTE — Assessment & Plan Note (Signed)
Controlled, no change in medication  

## 2017-09-22 NOTE — Progress Notes (Signed)
Brittany Nunez     MRN: [id number]      DOB: [birth year]   HPI Brittany Nunez is here for follow up and re-evaluation of chronic medical conditions, medication management and review of any available recent lab and radiology data.  Preventive health is updated, specifically  Cancer screening and Immunization.   Questions or concerns regarding consultations or procedures which the PT has had in the interim are  addressed. C/o no coverage for gabapentin which she has relied on, will get back in touch with alternatives after cost comparing.Denies polyuria, polydipsia, blurred vision , or hypoglycemic episodes. Has continued to work on weight loss through modifying her diet with great success, exercise is still sub optimal, but she does feel well  ROS Denies recent fever or chills. Denies sinus pressure, nasal congestion, ear pain or sore throat. Denies chest congestion, productive cough or wheezing. Denies chest pains, palpitations and leg swelling Denies abdominal pain, nausea, vomiting,diarrhea or constipation.   Denies dysuria, frequency, hesitancy or incontinence. Denies uncontrolled  joint pain, swelling and limitation in mobility. Denies headaches, seizures, numbness, or tingling. Denies depression, anxiety or insomnia. Denies skin break down or rash.   PE  BP 110/78   Pulse 66   Resp 16   Ht 5\' 5"  (1.651 m)   Wt 190 lb (86.2 kg)   SpO2 98%   BMI 31.62 kg/m   Patient alert and oriented and in no cardiopulmonary distress.  HEENT: No facial asymmetry, EOMI,   oropharynx pink and moist.  Neck supple no JVD, no mass.  Chest: Clear to auscultation bilaterally.  CVS: S1, S2 no murmurs, no S3.Regular rate.  ABD: Soft non tender.   Ext: No edema  MS: decreased  ROM spine, shoulders, hips and knees.  Skin: Intact, no ulcerations or rash noted.  Psych: Good eye contact, normal affect. Memory intact not anxious or depressed appearing.  CNS: CN 2-12 intact, power,  normal  throughout.no focal deficits noted.   Assessment & Plan  Essential hypertension Controlled, no change in medication DASH diet and commitment to daily physical activity for a minimum of 30 minutes discussed and encouraged, as a part of hypertension management. The importance of attaining a healthy weight is also discussed.  BP/Weight 09/16/2017 04/30/2017 03/04/2017 11/05/2016 03/12/2016 02/28/2016 11/02/2015  Systolic BP 110 110 170 120 118 130 120  Diastolic BP 78 80 100 84 82 100 84  Wt. (Lbs) 190 196 223 212.12 210.12 212.4 206.12  BMI 31.62 32.62 37.11 35.3 34.97 35.35 34.3       Obesity (BMI 30-39.9) Improved Patient re-educated about  the importance of commitment to a  minimum of 150 minutes of exercise per week.  The importance of healthy food choices with portion control discussed.  Goals set by the patient for the next several months.   Weight /BMI 09/16/2017 04/30/2017 03/04/2017  WEIGHT 190 lb 196 lb 223 lb  HEIGHT 5\' 5"  5\' 5"  5\' 5"   BMI 31.62 kg/m2 32.62 kg/m2 37.11 kg/m2      Controlled type 2 diabetes mellitus without complication (HCC) Controlled, no change in medication Brittany Nunez is reminded of the importance of commitment to daily physical activity for 30 minutes or more, as able and the need to limit carbohydrate intake to 30 to 60 grams per meal to help with blood sugar control.   The need to take medication as prescribed, test blood sugar as directed, and to call between visits if there is a concern that blood sugar is  uncontrolled is also discussed.   Brittany Nunez is reminded of the importance of daily foot exam, annual eye examination, and good blood sugar, blood pressure and cholesterol control.  Diabetic Labs Latest Ref Rng & Units 08/09/2017 04/25/2017 11/05/2016 02/23/2016 11/02/2015  HbA1c <5.7 % of total Hgb 6.4(H) 6.7(H) 6.9(H) 7.1(H) 6.1(H)  Microalbumin mg/dL - 0.9 - - -  Micro/Creat Ratio <30 mcg/mg creat - 6 - - -  Chol <200 mg/dL 161 - 096 - -  HDL >04  mg/dL 54(U) - 98(J) - -  Calc LDL mg/dL (calc) 98 - 191(Y) - -  Triglycerides <150 mg/dL 70 - 58 - -  Creatinine 0.50 - 1.05 mg/dL 7.82 9.56 2.13 0.86 5.78   BP/Weight 09/16/2017 04/30/2017 03/04/2017 11/05/2016 03/12/2016 02/28/2016 11/02/2015  Systolic BP 110 110 170 120 118 130 120  Diastolic BP 78 80 100 84 82 100 84  Wt. (Lbs) 190 196 223 212.12 210.12 212.4 206.12  BMI 31.62 32.62 37.11 35.3 34.97 35.35 34.3   Foot/eye exam completion dates Latest Ref Rng & Units 03/04/2017 02/28/2016  Eye Exam No Retinopathy - -  Foot Form Completion - Done Done        Back pain Controlled with gabapentin needs to seek substitute or actual  cost with use of her insurances or cash paying  Depression Controlled, no change in medication

## 2017-09-22 NOTE — Assessment & Plan Note (Signed)
Controlled, no change in medication DASH diet and commitment to daily physical activity for a minimum of 30 minutes discussed and encouraged, as a part of hypertension management. The importance of attaining a healthy weight is also discussed.  BP/Weight 09/16/2017 04/30/2017 03/04/2017 11/05/2016 03/12/2016 02/28/2016 11/02/2015  Systolic BP 110 110 170 120 118 130 120  Diastolic BP 78 80 100 84 82 100 84  Wt. (Lbs) 190 196 223 212.12 210.12 212.4 206.12  BMI 31.62 32.62 37.11 35.3 34.97 35.35 34.3

## 2017-09-22 NOTE — Assessment & Plan Note (Signed)
Controlled, no change in medication [name] is reminded of the importance of commitment to daily physical activity for 30 minutes or more, as able and the need to limit carbohydrate intake to 30 to 60 grams per meal to help with blood sugar control.   The need to take medication as prescribed, test blood sugar as directed, and to call between visits if there is a concern that blood sugar is uncontrolled is also discussed.   [name] is reminded of the importance of daily foot exam, annual eye examination, and good blood sugar, blood pressure and cholesterol control.  Diabetic Labs Latest Ref Rng & Units 08/09/2017 04/25/2017 11/05/2016 02/23/2016 11/02/2015  HbA1c <5.7 % of total Hgb 6.4(H) 6.7(H) 6.9(H) 7.1(H) 6.1(H)  Microalbumin mg/dL - 0.9 - - -  Micro/Creat Ratio <30 mcg/mg creat - 6 - - -  Chol <200 mg/dL 782155 - 956159 - -  HDL >21>50 mg/dL 30(Q41(L) - 65(H42(L) - -  Calc LDL mg/dL (calc) 98 - 846(N105(H) - -  Triglycerides <150 mg/dL 70 - 58 - -  Creatinine 0.50 - 1.05 mg/dL 6.290.93 5.280.93 4.130.88 2.440.93 0.100.96   BP/Weight 09/16/2017 04/30/2017 03/04/2017 11/05/2016 03/12/2016 02/28/2016 11/02/2015  Systolic BP 110 110 170 120 118 130 120  Diastolic BP 78 80 100 84 82 100 84  Wt. (Lbs) 190 196 223 212.12 210.12 212.4 206.12  BMI 31.62 32.62 37.11 35.3 34.97 35.35 34.3   Foot/eye exam completion dates Latest Ref Rng & Units 03/04/2017 02/28/2016  Eye Exam No Retinopathy - -  Foot Form Completion - Done Done

## 2017-09-22 NOTE — Assessment & Plan Note (Signed)
Controlled with gabapentin needs to seek substitute or actual  cost with use of her insurances or cash paying

## 2017-09-22 NOTE — Assessment & Plan Note (Signed)
Improved Patient re-educated about  the importance of commitment to a  minimum of 150 minutes of exercise per week.  The importance of healthy food choices with portion control discussed.  Goals set by the patient for the next several months.   Weight /BMI 09/16/2017 04/30/2017 03/04/2017  WEIGHT 190 lb 196 lb 223 lb  HEIGHT 5\' 5"  5\' 5"  5\' 5"   BMI 31.62 kg/m2 32.62 kg/m2 37.11 kg/m2

## 2017-09-24 ENCOUNTER — Other Ambulatory Visit: Payer: Self-pay

## 2017-09-24 ENCOUNTER — Telehealth: Payer: Self-pay | Admitting: Family Medicine

## 2017-09-24 MED ORDER — GABAPENTIN 800 MG PO TABS
800.0000 mg | ORAL_TABLET | Freq: Three times a day (TID) | ORAL | 1 refills | Status: DC
Start: 2017-09-24 — End: 2017-12-23

## 2017-10-14 DIAGNOSIS — N390 Urinary tract infection, site not specified: Secondary | ICD-10-CM | POA: Diagnosis not present

## 2017-10-28 ENCOUNTER — Other Ambulatory Visit: Payer: Self-pay | Admitting: Family Medicine

## 2017-10-29 ENCOUNTER — Other Ambulatory Visit: Payer: Self-pay | Admitting: Family Medicine

## 2017-10-29 MED ORDER — BACLOFEN 10 MG PO TABS: 10.0000 mg | ORAL_TABLET | Freq: Four times a day (QID) | ORAL | 5 refills | Status: DC

## 2017-10-29 NOTE — Progress Notes (Signed)
Baclofen  

## 2017-12-10 ENCOUNTER — Other Ambulatory Visit: Payer: Self-pay | Admitting: Family Medicine

## 2017-12-23 ENCOUNTER — Other Ambulatory Visit: Payer: Self-pay | Admitting: Family Medicine

## 2018-01-15 ENCOUNTER — Other Ambulatory Visit: Payer: Self-pay | Admitting: Family Medicine

## 2018-01-20 ENCOUNTER — Ambulatory Visit: Payer: BLUE CROSS/BLUE SHIELD

## 2018-01-27 ENCOUNTER — Other Ambulatory Visit: Payer: Self-pay | Admitting: Family Medicine

## 2018-02-17 ENCOUNTER — Other Ambulatory Visit: Payer: Self-pay | Admitting: Family Medicine

## 2018-02-17 DIAGNOSIS — E119 Type 2 diabetes mellitus without complications: Secondary | ICD-10-CM

## 2018-03-18 ENCOUNTER — Other Ambulatory Visit: Payer: Self-pay | Admitting: Family Medicine

## 2018-03-20 ENCOUNTER — Telehealth: Payer: Self-pay | Admitting: Family Medicine

## 2018-03-20 NOTE — Telephone Encounter (Signed)
Called patient to let her know that script has to be signed by Dr.Simpson. No answer. Left generic message requesting call back.

## 2018-03-20 NOTE — Telephone Encounter (Signed)
Prescription has been printed but has to be signed by Dr.Simpson before it can be faxed in.

## 2018-03-20 NOTE — Telephone Encounter (Signed)
PT LVM that she needs her Temazepam called in

## 2018-03-21 NOTE — Telephone Encounter (Signed)
Called patient to let her know that the script she is requesting has to be signed. No answer. 3rd attempt to speak with patient.

## 2018-03-31 ENCOUNTER — Other Ambulatory Visit: Payer: Self-pay

## 2018-03-31 ENCOUNTER — Ambulatory Visit (INDEPENDENT_AMBULATORY_CARE_PROVIDER_SITE_OTHER): Payer: BLUE CROSS/BLUE SHIELD | Admitting: Family Medicine

## 2018-03-31 ENCOUNTER — Encounter: Payer: Self-pay | Admitting: Family Medicine

## 2018-03-31 VITALS — BP 130/72 | HR 100 | Resp 12 | Ht 66.0 in | Wt 201.1 lb

## 2018-03-31 DIAGNOSIS — E785 Hyperlipidemia, unspecified: Secondary | ICD-10-CM | POA: Diagnosis not present

## 2018-03-31 DIAGNOSIS — Z23 Encounter for immunization: Secondary | ICD-10-CM

## 2018-03-31 DIAGNOSIS — E669 Obesity, unspecified: Secondary | ICD-10-CM | POA: Diagnosis not present

## 2018-03-31 DIAGNOSIS — F3289 Other specified depressive episodes: Secondary | ICD-10-CM

## 2018-03-31 DIAGNOSIS — Z1211 Encounter for screening for malignant neoplasm of colon: Secondary | ICD-10-CM | POA: Diagnosis not present

## 2018-03-31 DIAGNOSIS — E119 Type 2 diabetes mellitus without complications: Secondary | ICD-10-CM

## 2018-03-31 DIAGNOSIS — I10 Essential (primary) hypertension: Secondary | ICD-10-CM

## 2018-03-31 LAB — COMPLETE METABOLIC PANEL WITH GFR
AG RATIO: 1.2 (calc) (ref 1.0–2.5)
ALT: 12 U/L (ref 6–29)
AST: 16 U/L (ref 10–35)
Albumin: 4.1 g/dL (ref 3.6–5.1)
Alkaline phosphatase (APISO): 89 U/L (ref 33–130)
BUN: 13 mg/dL (ref 7–25)
CO2: 28 mmol/L (ref 20–32)
Calcium: 9.3 mg/dL (ref 8.6–10.4)
Chloride: 104 mmol/L (ref 98–110)
Creat: 0.92 mg/dL (ref 0.50–1.05)
GFR, EST AFRICAN AMERICAN: 80 mL/min/{1.73_m2} (ref >=60)
GFR, Est Non African American: 69 mL/min/{1.73_m2} (ref >=60)
GLOBULIN: 3.3 g/dL (ref 1.9–3.7)
Glucose, Bld: 127 mg/dL — ABNORMAL HIGH (ref 65–99)
POTASSIUM: 4.4 mmol/L (ref 3.5–5.3)
SODIUM: 140 mmol/L (ref 135–146)
Total Bilirubin: 0.4 mg/dL (ref 0.2–1.2)
Total Protein: 7.4 g/dL (ref 6.1–8.1)

## 2018-03-31 LAB — LIPID PANEL
CHOL/HDL RATIO: 4.4 (calc) (ref <5.0)
CHOLESTEROL: 194 mg/dL (ref <200)
HDL: 44 mg/dL — AB (ref >50)
LDL Cholesterol (Calc): 134 mg/dL (calc) — ABNORMAL HIGH
Non-HDL Cholesterol (Calc): 150 mg/dL (calc) — ABNORMAL HIGH (ref <130)
Triglycerides: 65 mg/dL (ref <150)

## 2018-03-31 LAB — HEMOGLOBIN A1C
Hgb A1c MFr Bld: 6.8 % of total Hgb — ABNORMAL HIGH (ref <5.7)
MEAN PLASMA GLUCOSE: 148 (calc)
eAG (mmol/L): 8.2 (calc)

## 2018-03-31 LAB — CBC
HEMATOCRIT: 36.6 % (ref 35.0–45.0)
HEMOGLOBIN: 11.9 g/dL (ref 11.7–15.5)
MCH: 25.3 pg — AB (ref 27.0–33.0)
MCHC: 32.5 g/dL (ref 32.0–36.0)
MCV: 77.7 fL — AB (ref 80.0–100.0)
MPV: 9.2 fL (ref 7.5–12.5)
Platelets: 411 10*3/uL — ABNORMAL HIGH (ref 140–400)
RBC: 4.71 10*6/uL (ref 3.80–5.10)
RDW: 14 % (ref 11.0–15.0)
WBC: 6.1 10*3/uL (ref 3.8–10.8)

## 2018-03-31 MED ORDER — SAXAGLIPTIN-METFORMIN ER 2.5-1000 MG PO TB24: ORAL_TABLET | ORAL | 5 refills | Status: DC

## 2018-03-31 MED ORDER — TEMAZEPAM 30 MG PO CAPS: ORAL_CAPSULE | ORAL | 0 refills | Status: DC

## 2018-03-31 MED ORDER — GABAPENTIN 800 MG PO TABS: 800.0000 mg | ORAL_TABLET | Freq: Three times a day (TID) | ORAL | 1 refills | Status: DC

## 2018-03-31 MED ORDER — VENLAFAXINE HCL ER 37.5 MG PO CP24: ORAL_CAPSULE | ORAL | 1 refills | Status: DC

## 2018-03-31 MED ORDER — FLUOXETINE HCL 20 MG PO CAPS: 20.0000 mg | ORAL_CAPSULE | Freq: Every day | ORAL | 1 refills | Status: DC

## 2018-03-31 MED ORDER — TEMAZEPAM 30 MG PO CAPS: 30.0000 mg | ORAL_CAPSULE | Freq: Every evening | ORAL | 5 refills | Status: DC | PRN

## 2018-03-31 NOTE — Patient Instructions (Addendum)
Wellness with nurse past due, please schedule before end 2019, start shingrix #1 at that visit , schedule in 4 weeks please Annual physical exam with MD early January, call if you need me sooner  Flu vaccine today  We will call with appt info re Eye exam  Labs past due ,, get these today Pls lipid cmp and eGFR, hBa1C  And CBC, f/u with nurse tomorrow re results  Needs cologuard arranged for office  Today,  She is anemic   Will send in for [place] and try to overide  Due to intolerance to metformin, also check on saving coupon with your ins [employer], let us know if cannot afford  It is important that you exercise regularly at least 30 minutes 5 times a week. If you develop chest pain, have severe difficulty breathing, or feel very tired, stop exercising immediately and seek medical attention  Please work on good  health habits so that your health will improve. 1. Commitment to daily physical activity for 30 to 60  minutes, if you are able to do this.  2. Commitment to wise food choices. Aim for half of your  food intake to be vegetable and fruit, one quarter starchy foods, and one quarter protein. Try to eat on a regular schedule  3 meals per day, snacking between meals should be limited to vegetables or fruits or small portions of nuts. 64 ounces of water per day is generally recommended, unless you have specific health conditions, like heart failure or kidney failure where you will need to limit fluid intake.  3. Commitment to sufficient and a  good quality of physical and mental rest daily, generally between 6 to 8 hours per day.  WITH PERSISTANCE AND PERSEVERANCE, THE IMPOSSIBLE , BECOMES THE NORM!   Thank you  for choosing [facility]. We consider it a privelige to serve you.  Delivering excellent health care in a caring and  compassionate way is our goal.  Partnering with you,  so that together we can achieve this goal is our strategy.

## 2018-03-31 NOTE — Assessment & Plan Note (Signed)
After obtaining informed consent, the vaccine is  administered by LPN.  

## 2018-03-31 NOTE — Assessment & Plan Note (Signed)
Updated lab needed at/ before next visit. Now reports intolerance to metformin x 12 month Brittany Nunez is reminded of the importance of commitment to daily physical activity for 30 minutes or more, as able and the need to limit carbohydrate intake to 30 to 60 grams per meal to help with blood sugar control.   The need to take medication as prescribed, test blood sugar as directed, and to call between visits if there is a concern that blood sugar is uncontrolled is also discussed.   Brittany Nunez is reminded of the importance of daily foot exam, annual eye examination, and good blood sugar, blood pressure and cholesterol control.  Diabetic Labs Latest Ref Rng & Units 08/09/2017 04/25/2017 11/05/2016 02/23/2016 11/02/2015  HbA1c <5.7 % of total Hgb 6.4(H) 6.7(H) 6.9(H) 7.1(H) 6.1(H)  Microalbumin mg/dL - 0.9 - - -  Micro/Creat Ratio <30 mcg/mg creat - 6 - - -  Chol <200 mg/dL 161 - 096 - -  HDL >04 mg/dL 54(U) - 98(J) - -  Calc LDL mg/dL (calc) 98 - 191(Y) - -  Triglycerides <150 mg/dL 70 - 58 - -  Creatinine 0.50 - 1.05 mg/dL 7.82 9.56 2.13 0.86 5.78   BP/Weight 03/31/2018 09/16/2017 04/30/2017 03/04/2017 11/05/2016 03/12/2016 02/28/2016  Systolic BP 130 110 110 170 120 118 130  Diastolic BP 72 78 80 100 84 82 100  Wt. (Lbs) 201.12 190 196 223 212.12 210.12 212.4  BMI 32.46 31.62 32.62 37.11 35.3 34.97 35.35   Foot/eye exam completion dates Latest Ref Rng & Units 03/04/2017 02/28/2016  Eye Exam No Retinopathy - -  Foot Form Completion - Done Done

## 2018-03-31 NOTE — Assessment & Plan Note (Signed)
Controlled, no change in medication DASH diet and commitment to daily physical activity for a minimum of 30 minutes discussed and encouraged, as a part of hypertension management. The importance of attaining a healthy weight is also discussed.  BP/Weight 03/31/2018 09/16/2017 04/30/2017 03/04/2017 11/05/2016 03/12/2016 02/28/2016  Systolic BP 130 110 110 170 120 118 130  Diastolic BP 72 78 80 100 84 82 100  Wt. (Lbs) 201.12 190 196 223 212.12 210.12 212.4  BMI 32.46 31.62 32.62 37.11 35.3 34.97 35.35

## 2018-03-31 NOTE — Progress Notes (Signed)
Brittany Nunez     MRN: [id number]      DOB: Feb 27, 1961   HPI Brittany Nunez is here for follow up and re-evaluation of chronic medical conditions, medication management and review of any available recent lab and radiology data.  Preventive health is updated, specifically  Cancer screening and Immunization.   1 month h/o diarrhea and fecal incontinence associated with metformin, states she cannot take the med, and FBG increased to 140 since not being on medication Tolerated kombiglyze in the past but ins would not cover , will try to override. Never did stool cards and anemic, will arrange for cologuard Has not been exercising and has been eating indiscrimantely, 11 pound weight gain, will work on this  ROS Denies recent fever or chills. Denies sinus pressure, nasal congestion, ear pain or sore throat. Denies chest congestion, productive cough or wheezing. Denies chest pains, palpitations and leg swelling  Denies dysuria, frequency, hesitancy or incontinence. Denies joint pain, swelling and limitation in mobility. Denies headaches, seizures, numbness, or tingling. Denies depression, anxiety or insomnia. Denies skin break down or rash.   PE BP 130/72 (BP Location: Right Arm, Patient Position: Sitting, Cuff Size: Large)   Pulse 100   Resp 12   Ht 5\' 6"  (1.676 m)   Wt 201 lb 1.9 oz (91.2 kg)   SpO2 95% Comment: room air  BMI 32.46 kg/m    Patient alert and oriented and in no cardiopulmonary distress.  HEENT: No facial asymmetry, EOMI,   oropharynx pink and moist.  Neck supple no JVD, no mass.  Chest: Clear to auscultation bilaterally.  CVS: S1, S2 no murmurs, no S3.Regular rate.  ABD: Soft non tender.   Ext: No edema  MS: Adequate ROM spine, shoulders, hips and knees.  Skin: Intact, no ulcerations or rash noted.  Psych: Good eye contact, normal affect. Memory intact not anxious or depressed appearing.  CNS: CN 2-12 intact, power,  normal throughout.no focal deficits  noted.   Assessment & Plan  Essential hypertension Controlled, no change in medication DASH diet and commitment to daily physical activity for a minimum of 30 minutes discussed and encouraged, as a part of hypertension management. The importance of attaining a healthy weight is also discussed.  BP/Weight 03/31/2018 09/16/2017 04/30/2017 03/04/2017 11/05/2016 03/12/2016 02/28/2016  Systolic BP 130 110 110 170 120 118 130  Diastolic BP 72 78 80 100 84 82 100  Wt. (Lbs) 201.12 190 196 223 212.12 210.12 212.4  BMI 32.46 31.62 32.62 37.11 35.3 34.97 35.35       Controlled type 2 diabetes mellitus without complication (HCC) Updated lab needed at/ before next visit. Now reports intolerance to metformin x 12 month Brittany Nunez is reminded of the importance of commitment to daily physical activity for 30 minutes or more, as able and the need to limit carbohydrate intake to 30 to 60 grams per meal to help with blood sugar control.   The need to take medication as prescribed, test blood sugar as directed, and to call between visits if there is a concern that blood sugar is uncontrolled is also discussed.   Brittany Nunez is reminded of the importance of daily foot exam, annual eye examination, and good blood sugar, blood pressure and cholesterol control.  Diabetic Labs Latest Ref Rng & Units 08/09/2017 04/25/2017 11/05/2016 02/23/2016 11/02/2015  HbA1c <5.7 % of total Hgb 6.4(H) 6.7(H) 6.9(H) 7.1(H) 6.1(H)  Microalbumin mg/dL - 0.9 - - -  Micro/Creat Ratio <30 mcg/mg creat - 6 - - -  Chol <200 mg/dL 811 - 914 - -  HDL >78 mg/dL 29(F) - 62(Z) - -  Calc LDL mg/dL (calc) 98 - 308(M) - -  Triglycerides <150 mg/dL 70 - 58 - -  Creatinine 0.50 - 1.05 mg/dL 5.78 4.69 6.29 5.28 4.13   BP/Weight 03/31/2018 09/16/2017 04/30/2017 03/04/2017 11/05/2016 03/12/2016 02/28/2016  Systolic BP 130 110 110 170 120 118 130  Diastolic BP 72 78 80 100 84 82 100  Wt. (Lbs) 201.12 190 196 223 212.12 210.12 212.4  BMI 32.46 31.62 32.62  37.11 35.3 34.97 35.35   Foot/eye exam completion dates Latest Ref Rng & Units 03/04/2017 02/28/2016  Eye Exam No Retinopathy - -  Foot Form Completion - Done Done        Depression Controlled, no change in medication   Obesity (BMI 30-39.9) Deteriorated. Patient re-educated about  the importance of commitment to a  minimum of 150 minutes of exercise per week.  The importance of healthy food choices with portion control discussed. Encouraged to start a food diary, count calories and to consider  joining a support group. Sample diet sheets offered. Goals set by the patient for the next several months.   Weight /BMI 03/31/2018 09/16/2017 04/30/2017  WEIGHT 201 lb 1.9 oz 190 lb 196 lb  HEIGHT 5\' 6"  5\' 5"  5\' 5"   BMI 32.46 kg/m2 31.62 kg/m2 32.62 kg/m2      Need for immunization against influenza After obtaining informed consent, the vaccine is  administered by LPN.

## 2018-03-31 NOTE — Assessment & Plan Note (Signed)
Deteriorated. Patient re-educated about  the importance of commitment to a  minimum of 150 minutes of exercise per week.  The importance of healthy food choices with portion control discussed. Encouraged to start a food diary, count calories and to consider  joining a support group. Sample diet sheets offered. Goals set by the patient for the next several months.   Weight /BMI 03/31/2018 09/16/2017 04/30/2017  WEIGHT 201 lb 1.9 oz 190 lb 196 lb  HEIGHT 5\' 6"  5\' 5"  5\' 5"   BMI 32.46 kg/m2 31.62 kg/m2 32.62 kg/m2

## 2018-03-31 NOTE — Assessment & Plan Note (Signed)
Controlled, no change in medication  

## 2018-04-01 ENCOUNTER — Encounter: Payer: Self-pay | Admitting: Family Medicine

## 2018-04-03 ENCOUNTER — Telehealth: Payer: Self-pay

## 2018-04-03 MED ORDER — SITAGLIP PHOS-METFORMIN HCL ER 50-1000 MG PO TB24: 2.0000 | ORAL_TABLET | Freq: Every day | ORAL | 2 refills | Status: DC

## 2018-04-03 NOTE — Telephone Encounter (Signed)
Medication changed from Kombiglyze xr to Janumet ER due to insurance coverage per Dr.Simpson

## 2018-04-04 ENCOUNTER — Other Ambulatory Visit: Payer: Self-pay | Admitting: Family Medicine

## 2018-04-04 MED ORDER — ROSUVASTATIN CALCIUM 5 MG PO TABS: 5.0000 mg | ORAL_TABLET | Freq: Every day | ORAL | 3 refills | Status: DC

## 2018-04-04 NOTE — Progress Notes (Signed)
crestor added as pt is diabetic also LDL is elevated

## 2018-04-09 ENCOUNTER — Ambulatory Visit (INDEPENDENT_AMBULATORY_CARE_PROVIDER_SITE_OTHER): Payer: BLUE CROSS/BLUE SHIELD

## 2018-04-09 VITALS — BP 128/82 | HR 96 | Resp 12 | Ht 66.0 in | Wt 202.8 lb

## 2018-04-09 DIAGNOSIS — Z0000 Encounter for general adult medical examination without abnormal findings: Secondary | ICD-10-CM | POA: Diagnosis not present

## 2018-04-09 NOTE — Patient Instructions (Signed)
Brittany Nunez , Thank you for taking time to come for your Medicare Wellness Visit. I appreciate your ongoing commitment to your health goals. Please review the following plan we discussed and let me know if I can assist you in the future.   Screening recommendations/referrals: Colonoscopy: up to date  Mammogram: up to date  Bone Density: up to date  Recommended yearly ophthalmology/optometry visit for glaucoma screening and checkup Recommended yearly dental visit for hygiene and checkup  Vaccinations: Influenza vaccine: up to date  Pneumococcal vaccine: up to date  Tdap vaccine: up to  date  Shingles vaccine: next visit     Advanced directives: information given   Conditions/risks identified: sedentary life style,   Next appointment: wellness visit in one year   Preventive Care 40-64 Years, Female Preventive care refers to lifestyle choices and visits with your health care provider that can promote health and wellness. What does preventive care include?  A yearly physical exam. This is also called an annual well check.  Dental exams once or twice a year.  Routine eye exams. Ask your health care provider how often you should have your eyes checked.  Personal lifestyle choices, including:  Daily care of your teeth and gums.  Regular physical activity.  Eating a healthy diet.  Avoiding tobacco and drug use.  Limiting alcohol use.  Practicing safe sex.  Taking low-dose aspirin daily starting at age 7.  Taking vitamin and mineral supplements as recommended by your health care provider. What happens during an annual well check? The services and screenings done by your health care provider during your annual well check will depend on your age, overall health, lifestyle risk factors, and family history of disease. Counseling  Your health care provider may ask you questions about your:  Alcohol use.  Tobacco use.  Drug use.  Emotional well-being.  Home and  relationship well-being.  Sexual activity.  Eating habits.  Work and work Statistician.  Method of birth control.  Menstrual cycle.  Pregnancy history. Screening  You may have the following tests or measurements:  Height, weight, and BMI.  Blood pressure.  Lipid and cholesterol levels. These may be checked every 5 years, or more frequently if you are over 47 years old.  Skin check.  Lung cancer screening. You may have this screening every year starting at age 22 if you have a 30-pack-year history of smoking and currently smoke or have quit within the past 15 years.  Fecal occult blood test (FOBT) of the stool. You may have this test every year starting at age 19.  Flexible sigmoidoscopy or colonoscopy. You may have a sigmoidoscopy every 5 years or a colonoscopy every 10 years starting at age 28.  Hepatitis C blood test.  Hepatitis B blood test.  Sexually transmitted disease (STD) testing.  Diabetes screening. This is done by checking your blood sugar (glucose) after you have not eaten for a while (fasting). You may have this done every 1-3 years.  Mammogram. This may be done every 1-2 years. Talk to your health care provider about when you should start having regular mammograms. This may depend on whether you have a family history of breast cancer.  BRCA-related cancer screening. This may be done if you have a family history of breast, ovarian, tubal, or peritoneal cancers.  Pelvic exam and Pap test. This may be done every 3 years starting at age 37. Starting at age 32, this may be done every 5 years if you have a Pap  test in combination with an HPV test.  Bone density scan. This is done to screen for osteoporosis. You may have this scan if you are at high risk for osteoporosis. Discuss your test results, treatment options, and if necessary, the need for more tests with your health care provider. Vaccines  Your health care provider may recommend certain vaccines, such  as:  Influenza vaccine. This is recommended every year.  Tetanus, diphtheria, and acellular pertussis (Tdap, Td) vaccine. You may need a Td booster every 10 years.  Zoster vaccine. You may need this after age 40.  Pneumococcal 13-valent conjugate (PCV13) vaccine. You may need this if you have certain conditions and were not previously vaccinated.  Pneumococcal polysaccharide (PPSV23) vaccine. You may need one or two doses if you smoke cigarettes or if you have certain conditions. Talk to your health care provider about which screenings and vaccines you need and how often you need them. This information is not intended to replace advice given to you by your health care provider. Make sure you discuss any questions you have with your health care provider. Document Released: 07/08/2015 Document Revised: 02/29/2016 Document Reviewed: 04/12/2015 Elsevier Interactive Patient Education  2017 [employer] Prevention in the Home Falls can cause injuries. They can happen to people of all ages. There are many things you can do to make your home safe and to help prevent falls. What can I do on the outside of my home?  Regularly fix the edges of walkways and driveways and fix any cracks.  Remove anything that might make you trip as you walk through a door, such as a raised step or threshold.  Trim any bushes or trees on the path to your home.  Use bright outdoor lighting.  Clear any walking paths of anything that might make someone trip, such as rocks or tools.  Regularly check to see if handrails are loose or broken. Make sure that both sides of any steps have handrails.  Any raised decks and porches should have guardrails on the edges.  Have any leaves, snow, or ice cleared regularly.  Use sand or salt on walking paths during winter.  Clean up any spills in your garage right away. This includes oil or grease spills. What can I do in the bathroom?  Use night  lights.  Install grab bars by the toilet and in the tub and shower. Do not use towel bars as grab bars.  Use non-skid mats or decals in the tub or shower.  If you need to sit down in the shower, use a plastic, non-slip stool.  Keep the floor dry. Clean up any water that spills on the floor as soon as it happens.  Remove soap buildup in the tub or shower regularly.  Attach bath mats securely with double-sided non-slip rug tape.  Do not have throw rugs and other things on the floor that can make you trip. What can I do in the bedroom?  Use night lights.  Make sure that you have a light by your bed that is easy to reach.  Do not use any sheets or blankets that are too big for your bed. They should not hang down onto the floor.  Have a firm chair that has side arms. You can use this for support while you get dressed.  Do not have throw rugs and other things on the floor that can make you trip. What can I do in the kitchen?  Clean up  any spills right away.  Avoid walking on wet floors.  Keep items that you use a lot in easy-to-reach places.  If you need to reach something above you, use a strong step stool that has a grab bar.  Keep electrical cords out of the way.  Do not use floor polish or wax that makes floors slippery. If you must use wax, use non-skid floor wax.  Do not have throw rugs and other things on the floor that can make you trip. What can I do with my stairs?  Do not leave any items on the stairs.  Make sure that there are handrails on both sides of the stairs and use them. Fix handrails that are broken or loose. Make sure that handrails are as long as the stairways.  Check any carpeting to make sure that it is firmly attached to the stairs. Fix any carpet that is loose or worn.  Avoid having throw rugs at the top or bottom of the stairs. If you do have throw rugs, attach them to the floor with carpet tape.  Make sure that you have a light switch at the  top of the stairs and the bottom of the stairs. If you do not have them, ask someone to add them for you. What else can I do to help prevent falls?  Wear shoes that:  Do not have high heels.  Have rubber bottoms.  Are comfortable and fit you well.  Are closed at the toe. Do not wear sandals.  If you use a stepladder:  Make sure that it is fully opened. Do not climb a closed stepladder.  Make sure that both sides of the stepladder are locked into place.  Ask someone to hold it for you, if possible.  Clearly mark and make sure that you can see:  Any grab bars or handrails.  First and last steps.  Where the edge of each step is.  Use tools that help you move around (mobility aids) if they are needed. These include:  Canes.  Walkers.  Scooters.  Crutches.  Turn on the lights when you go into a dark area. Replace any light bulbs as soon as they burn out.  Set up your furniture so you have a clear path. Avoid moving your furniture around.  If any of your floors are uneven, fix them.  If there are any pets around you, be aware of where they are.  Review your medicines with your doctor. Some medicines can make you feel dizzy. This can increase your chance of falling. Ask your doctor what other things that you can do to help prevent falls. This information is not intended to replace advice given to you by your health care provider. Make sure you discuss any questions you have with your health care provider. Document Released: 04/07/2009 Document Revised: 11/17/2015 Document Reviewed: 07/16/2014 Elsevier Interactive Patient Education  2017 [employer].
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
## 2018-04-18 DIAGNOSIS — E119 Type 2 diabetes mellitus without complications: Secondary | ICD-10-CM

## 2018-04-18 DIAGNOSIS — I10 Essential (primary) hypertension: Secondary | ICD-10-CM

## 2018-04-24 DIAGNOSIS — Z1211 Encounter for screening for malignant neoplasm of colon: Secondary | ICD-10-CM | POA: Diagnosis not present

## 2018-05-02 LAB — COLOGUARD: Cologuard: NEGATIVE

## 2018-05-12 ENCOUNTER — Other Ambulatory Visit: Payer: Self-pay | Admitting: Family Medicine

## 2018-06-16 ENCOUNTER — Other Ambulatory Visit: Payer: Self-pay | Admitting: Family Medicine

## 2018-06-26 ENCOUNTER — Encounter: Payer: Self-pay | Admitting: *Deleted

## 2018-07-03 ENCOUNTER — Ambulatory Visit (INDEPENDENT_AMBULATORY_CARE_PROVIDER_SITE_OTHER): Payer: BLUE CROSS/BLUE SHIELD | Admitting: Family Medicine

## 2018-07-03 ENCOUNTER — Other Ambulatory Visit (HOSPITAL_COMMUNITY)
Admission: AD | Admit: 2018-07-03 | Discharge: 2018-07-03 | Disposition: A | Discharge status: Home / Self Care | Payer: BLUE CROSS/BLUE SHIELD | Source: Other Acute Inpatient Hospital | Attending: Family Medicine | Admitting: Family Medicine

## 2018-07-03 ENCOUNTER — Encounter: Payer: Self-pay | Admitting: Family Medicine

## 2018-07-03 VITALS — BP 120/80 | HR 94 | Resp 15 | Ht 66.0 in | Wt 203.0 lb

## 2018-07-03 DIAGNOSIS — E669 Obesity, unspecified: Secondary | ICD-10-CM | POA: Diagnosis not present

## 2018-07-03 DIAGNOSIS — M62838 Other muscle spasm: Secondary | ICD-10-CM

## 2018-07-03 DIAGNOSIS — E1159 Type 2 diabetes mellitus with other circulatory complications: Secondary | ICD-10-CM

## 2018-07-03 DIAGNOSIS — M51369 Other intervertebral disc degeneration, lumbar region without mention of lumbar back pain or lower extremity pain: Secondary | ICD-10-CM

## 2018-07-03 DIAGNOSIS — E119 Type 2 diabetes mellitus without complications: Secondary | ICD-10-CM | POA: Diagnosis not present

## 2018-07-03 DIAGNOSIS — I10 Essential (primary) hypertension: Secondary | ICD-10-CM

## 2018-07-03 DIAGNOSIS — M5126 Other intervertebral disc displacement, lumbar region: Secondary | ICD-10-CM | POA: Diagnosis not present

## 2018-07-03 DIAGNOSIS — E785 Hyperlipidemia, unspecified: Secondary | ICD-10-CM | POA: Diagnosis not present

## 2018-07-03 DIAGNOSIS — F329 Major depressive disorder, single episode, unspecified: Secondary | ICD-10-CM

## 2018-07-03 DIAGNOSIS — M5136 Other intervertebral disc degeneration, lumbar region: Secondary | ICD-10-CM

## 2018-07-03 MED ORDER — IBUPROFEN 800 MG PO TABS: 800.0000 mg | ORAL_TABLET | Freq: Three times a day (TID) | ORAL | 0 refills | Status: DC | PRN

## 2018-07-03 MED ORDER — OMEPRAZOLE 20 MG PO CPDR: DELAYED_RELEASE_CAPSULE | ORAL | 5 refills | Status: DC

## 2018-07-03 MED ORDER — GABAPENTIN 800 MG PO TABS: 800.0000 mg | ORAL_TABLET | Freq: Three times a day (TID) | ORAL | 1 refills | Status: DC

## 2018-07-03 MED ORDER — PREDNISONE 10 MG (21) PO TBPK: ORAL_TABLET | ORAL | 0 refills | Status: DC

## 2018-07-03 MED ORDER — SITAGLIP PHOS-METFORMIN HCL ER 50-1000 MG PO TB24: 2.0000 | ORAL_TABLET | Freq: Every day | ORAL | 5 refills | Status: DC

## 2018-07-03 MED ORDER — KETOROLAC TROMETHAMINE 60 MG/2ML IM SOLN
60.0000 mg | Freq: Once | INTRAMUSCULAR | Status: AC
  Administered 2018-07-03: 60 mg via INTRAMUSCULAR

## 2018-07-03 MED ORDER — FAMOTIDINE 40 MG PO TABS: 40.0000 mg | ORAL_TABLET | Freq: Every day | ORAL | 0 refills | Status: DC

## 2018-07-03 MED ORDER — METHYLPREDNISOLONE ACETATE 80 MG/ML IJ SUSP
80.0000 mg | Freq: Once | INTRAMUSCULAR | Status: AC
  Administered 2018-07-03: 80 mg via INTRAMUSCULAR

## 2018-07-03 NOTE — Assessment & Plan Note (Signed)
Uncontrolled.Toradol and depo medrol administered IM in the office , to be followed by a short course of oral prednisone and NSAIDS.  

## 2018-07-03 NOTE — Patient Instructions (Addendum)
F/U end March , call if you need me sooner  Toradol and depo medrol injected in low back for pain , and ibuprofen and prednisone are prescribed  Please submit urine for micro albumin today  Your foot exam is good today  Need fasting lipid, cmp and egFR, HBA1c and TSH 1 week before next visit  Best for 2020  You will be contacted about the telephone call, again we are sorry that this occured

## 2018-07-03 NOTE — Progress Notes (Signed)
Brittany Nunez     MRN: [id number]      DOB: [birth year]   HPI Brittany Nunez is here for follow up and re-evaluation of chronic medical conditions, medication management and review of any available recent lab and radiology data.  Preventive health is updated, specifically  Cancer screening and Immunization.   Questions or concerns regarding consultations or procedures which the PT has had in the interim are  addressed. The PT denies any adverse reactions to current medications since the last visit.  Increased upper and lower back pain radiaiting to right knee since bending forward on 12/24 to take something from the oven Pain is rated at currently at 8 was a 10 plus, states she was screaming and could not walk for 1 q week ROS Denies recent fever or chills. Denies sinus pressure, nasal congestion, ear pain or sore throat. Denies chest congestion, productive cough or wheezing. Denies chest pains, palpitations and leg swelling Denies abdominal pain, nausea, vomiting,diarrhea or constipation.   Denies dysuria, frequency, hesitancy or incontinence.  Denies headaches, seizures, numbness, or tingling. Denies depression, anxiety or insomnia. Denies skin break down or rash.   PE  BP 120/80   Pulse 94   Resp 15   Ht 5\' 6"  (1.676 m)   Wt 203 lb (92.1 kg)   SpO2 99%   BMI 32.77 kg/m   Patient alert and oriented and in no cardiopulmonary distress.  HEENT: No facial asymmetry, EOMI,   oropharynx pink and moist.  Neck supple no JVD, no mass.  Chest: Clear to auscultation bilaterally.  CVS: S1, S2 no murmurs, no S3.Regular rate.  ABD: Soft non tender.   Ext: No edema  WU:[id number]:decreased  ROM spine,normal in  shoulders, hips and knees.  Skin: Intact, no ulcerations or rash noted.  Psych: Good eye contact, normal affect. Memory intact not anxious or depressed appearing.  CNS: CN 2-12 intact, power,  normal throughout.no focal deficits noted.   Assessment & Plan  Bulge of lumbar disc  without myelopathy Uncontrolled.Toradol and depo medrol administered IM in the office , to be followed by a short course of oral prednisone and NSAIDS.   Essential hypertension Controlled, no change in medication DASH diet and commitment to daily physical activity for a minimum of 30 minutes discussed and encouraged, as a part of hypertension management. The importance of attaining a healthy weight is also discussed.  BP/Weight 07/03/2018 04/09/2018 03/31/2018 09/16/2017 04/30/2017 03/04/2017 11/05/2016  Systolic BP 120 128 130 110 110 170 120  Diastolic BP 80 82 72 78 80 100 84  Wt. (Lbs) 203 202.8 201.12 190 196 223 212.12  BMI 32.77 32.73 32.46 31.62 32.62 37.11 35.3       Obesity (BMI 30-39.9) Deteriorated. Patient re-educated about  the importance of commitment to a  minimum of 150 minutes of exercise per week.  The importance of healthy food choices with portion control discussed. Encouraged to start a food diary, count calories and to consider  joining a support group. Sample diet sheets offered. Goals set by the patient for the next several months.   Weight /BMI 07/03/2018 04/09/2018 03/31/2018  WEIGHT 203 lb 202 lb 12.8 oz 201 lb 1.9 oz  HEIGHT 5\' 6"  5\' 6"  5\' 6"   BMI 32.77 kg/m2 32.73 kg/m2 32.46 kg/m2      Spasm of muscle Controlled with n medication, continue same  Depression Controlled, no change in medication   Type 2 diabetes mellitus with vascular disease (HCC) Controlled, no change in medication  Brittany Nunez is reminded of the importance of commitment to daily physical activity for 30 minutes or more, as able and the need to limit carbohydrate intake to 30 to 60 grams per meal to help with blood sugar control.   The need to take medication as prescribed, test blood sugar as directed, and to call between visits if there is a concern that blood sugar is uncontrolled is also discussed.   Brittany Nunez is reminded of the importance of daily foot exam, annual eye examination,  and good blood sugar, blood pressure and cholesterol control.  Diabetic Labs Latest Ref Rng & Units 07/03/2018 03/31/2018 08/09/2017 04/25/2017 11/05/2016  HbA1c <5.7 % of total Hgb - 6.8(H) 6.4(H) 6.7(H) 6.9(H)  Microalbumin Not Estab. ug/mL 6.2(H) - - 0.9 -  Micro/Creat Ratio <30 mcg/mg creat - - - 6 -  Chol <200 mg/dL - 426 834 - 196  HDL >22 mg/dL - 29(N) 98(X) - 21(J)  Calc LDL mg/dL (calc) - 941(D) 98 - 408(X)  Triglycerides <150 mg/dL - 65 70 - 58  Creatinine 0.50 - 1.05 mg/dL - 4.48 1.85 6.31 4.97   BP/Weight 07/03/2018 04/09/2018 03/31/2018 09/16/2017 04/30/2017 03/04/2017 11/05/2016  Systolic BP 120 128 130 110 110 170 120  Diastolic BP 80 82 72 78 80 100 84  Wt. (Lbs) 203 202.8 201.12 190 196 223 212.12  BMI 32.77 32.73 32.46 31.62 32.62 37.11 35.3   Foot/eye exam completion dates Latest Ref Rng & Units 07/03/2018 03/04/2017  Eye Exam No Retinopathy - -  Foot Form Completion - Done Done

## 2018-07-04 LAB — MICROALBUMIN, URINE: Microalb, Ur: 6.2 ug/mL — ABNORMAL HIGH

## 2018-07-11 LAB — HM DIABETES EYE EXAM

## 2018-07-12 ENCOUNTER — Encounter: Payer: Self-pay | Admitting: Family Medicine

## 2018-07-12 NOTE — Assessment & Plan Note (Signed)
Controlled, no change in medication  

## 2018-07-12 NOTE — Assessment & Plan Note (Signed)
Controlled, no change in medication DASH diet and commitment to daily physical activity for a minimum of 30 minutes discussed and encouraged, as a part of hypertension management. The importance of attaining a healthy weight is also discussed.  BP/Weight 07/03/2018 04/09/2018 03/31/2018 09/16/2017 04/30/2017 03/04/2017 11/05/2016  Systolic BP 120 128 130 110 110 170 120  Diastolic BP 80 82 72 78 80 100 84  Wt. (Lbs) 203 202.8 201.12 190 196 223 212.12  BMI 32.77 32.73 32.46 31.62 32.62 37.11 35.3

## 2018-07-12 NOTE — Assessment & Plan Note (Signed)
Controlled, no change in medication Brittany Nunez is reminded of the importance of commitment to daily physical activity for 30 minutes or more, as able and the need to limit carbohydrate intake to 30 to 60 grams per meal to help with blood sugar control.   The need to take medication as prescribed, test blood sugar as directed, and to call between visits if there is a concern that blood sugar is uncontrolled is also discussed.   Brittany Nunez is reminded of the importance of daily foot exam, annual eye examination, and good blood sugar, blood pressure and cholesterol control.  Diabetic Labs Latest Ref Rng & Units 07/03/2018 03/31/2018 08/09/2017 04/25/2017 11/05/2016  HbA1c <5.7 % of total Hgb - 6.8(H) 6.4(H) 6.7(H) 6.9(H)  Microalbumin Not Estab. ug/mL 6.2(H) - - 0.9 -  Micro/Creat Ratio <30 mcg/mg creat - - - 6 -  Chol <200 mg/dL - 174 081 - 448  HDL >18 mg/dL - 56(D) 14(H) - 70(Y)  Calc LDL mg/dL (calc) - 637(C) 98 - 588(F)  Triglycerides <150 mg/dL - 65 70 - 58  Creatinine 0.50 - 1.05 mg/dL - 0.27 7.41 2.87 8.67   BP/Weight 07/03/2018 04/09/2018 03/31/2018 09/16/2017 04/30/2017 03/04/2017 11/05/2016  Systolic BP 120 128 130 110 110 170 120  Diastolic BP 80 82 72 78 80 100 84  Wt. (Lbs) 203 202.8 201.12 190 196 223 212.12  BMI 32.77 32.73 32.46 31.62 32.62 37.11 35.3   Foot/eye exam completion dates Latest Ref Rng & Units 07/03/2018 03/04/2017  Eye Exam No Retinopathy - -  Foot Form Completion - Done Done

## 2018-07-12 NOTE — Assessment & Plan Note (Signed)
Controlled with n medication, continue same

## 2018-07-12 NOTE — Assessment & Plan Note (Signed)
Deteriorated. Patient re-educated about  the importance of commitment to a  minimum of 150 minutes of exercise per week.  The importance of healthy food choices with portion control discussed. Encouraged to start a food diary, count calories and to consider  joining a support group. Sample diet sheets offered. Goals set by the patient for the next several months.   Weight /BMI 07/03/2018 04/09/2018 03/31/2018  WEIGHT 203 lb 202 lb 12.8 oz 201 lb 1.9 oz  HEIGHT 5\' 6"  5\' 6"  5\' 6"   BMI 32.77 kg/m2 32.73 kg/m2 32.46 kg/m2

## 2018-08-18 ENCOUNTER — Telehealth: Payer: Self-pay

## 2018-08-18 NOTE — Telephone Encounter (Signed)
Called patient about reducing medication, no answer, no way to leave a message

## 2018-08-19 ENCOUNTER — Telehealth: Payer: Self-pay

## 2018-08-19 NOTE — Telephone Encounter (Signed)
Tried to call patient, no answer, no way to leave message. Called patient's husband Brittany Nunez, he stated that he would have her call us back

## 2018-08-19 NOTE — Telephone Encounter (Signed)
Tried to call patient again, no answer, can't leave a message  

## 2018-08-20 ENCOUNTER — Telehealth: Payer: Self-pay

## 2018-08-20 NOTE — Telephone Encounter (Signed)
Tried to call patient again about her medication Baclofen. No answer, will send her a letter

## 2018-08-20 NOTE — Telephone Encounter (Signed)
PT lvm returning your call °

## 2018-08-21 NOTE — Telephone Encounter (Signed)
Pt is returning your call

## 2018-08-21 NOTE — Telephone Encounter (Signed)
Tried to call patient back no answer.  

## 2018-08-27 ENCOUNTER — Other Ambulatory Visit: Payer: Self-pay | Admitting: Family Medicine

## 2018-08-27 ENCOUNTER — Other Ambulatory Visit: Payer: Self-pay

## 2018-08-27 MED ORDER — BACLOFEN 10 MG PO TABS: 10.0000 mg | ORAL_TABLET | Freq: Three times a day (TID) | ORAL | 5 refills | Status: DC

## 2018-08-27 NOTE — Progress Notes (Signed)
Baclofen three times daily al;ready ordered

## 2018-09-12 DIAGNOSIS — E119 Type 2 diabetes mellitus without complications: Secondary | ICD-10-CM | POA: Diagnosis not present

## 2018-09-12 DIAGNOSIS — E785 Hyperlipidemia, unspecified: Secondary | ICD-10-CM | POA: Diagnosis not present

## 2018-09-12 DIAGNOSIS — I10 Essential (primary) hypertension: Secondary | ICD-10-CM | POA: Diagnosis not present

## 2018-09-13 LAB — COMPLETE METABOLIC PANEL WITH GFR
AG RATIO: 1.4 (calc) (ref 1.0–2.5)
ALT: 11 U/L (ref 6–29)
AST: 15 U/L (ref 10–35)
Albumin: 4 g/dL (ref 3.6–5.1)
Alkaline phosphatase (APISO): 95 U/L (ref 37–153)
BUN/Creatinine Ratio: 12 (calc) (ref 6–22)
BUN: 13 mg/dL (ref 7–25)
CALCIUM: 8.7 mg/dL (ref 8.6–10.4)
CO2: 27 mmol/L (ref 20–32)
Chloride: 106 mmol/L (ref 98–110)
Creat: 1.06 mg/dL — ABNORMAL HIGH (ref 0.50–1.05)
GFR, EST NON AFRICAN AMERICAN: 57 mL/min/{1.73_m2} — AB (ref >=60)
GFR, Est African American: 67 mL/min/{1.73_m2} (ref >=60)
GLOBULIN: 2.8 g/dL (ref 1.9–3.7)
Glucose, Bld: 122 mg/dL — ABNORMAL HIGH (ref 65–99)
POTASSIUM: 4.1 mmol/L (ref 3.5–5.3)
Sodium: 143 mmol/L (ref 135–146)
Total Bilirubin: 0.3 mg/dL (ref 0.2–1.2)
Total Protein: 6.8 g/dL (ref 6.1–8.1)

## 2018-09-13 LAB — LIPID PANEL
Cholesterol: 111 mg/dL (ref <200)
HDL: 37 mg/dL — AB (ref >=50)
LDL Cholesterol (Calc): 58 mg/dL (calc)
Non-HDL Cholesterol (Calc): 74 mg/dL (calc) (ref <130)
Total CHOL/HDL Ratio: 3 (calc) (ref <5.0)
Triglycerides: 80 mg/dL (ref <150)

## 2018-09-13 LAB — TSH: TSH: 2.28 mIU/L (ref 0.40–4.50)

## 2018-09-13 LAB — HEMOGLOBIN A1C
Hgb A1c MFr Bld: 6.4 % of total Hgb — ABNORMAL HIGH (ref <5.7)
Mean Plasma Glucose: 137 (calc)
eAG (mmol/L): 7.6 (calc)

## 2018-09-15 ENCOUNTER — Other Ambulatory Visit: Payer: Self-pay

## 2018-09-15 ENCOUNTER — Ambulatory Visit (INDEPENDENT_AMBULATORY_CARE_PROVIDER_SITE_OTHER): Payer: BLUE CROSS/BLUE SHIELD | Admitting: Family Medicine

## 2018-09-15 DIAGNOSIS — E669 Obesity, unspecified: Secondary | ICD-10-CM

## 2018-09-15 DIAGNOSIS — F329 Major depressive disorder, single episode, unspecified: Secondary | ICD-10-CM | POA: Diagnosis not present

## 2018-09-15 DIAGNOSIS — E1159 Type 2 diabetes mellitus with other circulatory complications: Secondary | ICD-10-CM | POA: Diagnosis not present

## 2018-09-15 DIAGNOSIS — E1169 Type 2 diabetes mellitus with other specified complication: Secondary | ICD-10-CM | POA: Diagnosis not present

## 2018-09-15 DIAGNOSIS — M62838 Other muscle spasm: Secondary | ICD-10-CM | POA: Diagnosis not present

## 2018-09-15 DIAGNOSIS — Z1231 Encounter for screening mammogram for malignant neoplasm of breast: Secondary | ICD-10-CM

## 2018-09-15 DIAGNOSIS — E785 Hyperlipidemia, unspecified: Secondary | ICD-10-CM

## 2018-09-15 DIAGNOSIS — I10 Essential (primary) hypertension: Secondary | ICD-10-CM

## 2018-09-15 MED ORDER — AMLODIPINE BESYLATE 5 MG PO TABS: 5.0000 mg | ORAL_TABLET | Freq: Every day | ORAL | 5 refills | Status: DC

## 2018-09-15 MED ORDER — VENLAFAXINE HCL ER 75 MG PO CP24: 75.0000 mg | ORAL_CAPSULE | Freq: Every day | ORAL | 5 refills | Status: DC

## 2018-09-15 MED ORDER — GABAPENTIN 800 MG PO TABS: 800.0000 mg | ORAL_TABLET | Freq: Three times a day (TID) | ORAL | 5 refills | Status: DC

## 2018-09-15 NOTE — Progress Notes (Signed)
Virtual Visit via Telephone Note   I connected with Brittany Nunez on 09/15/18 at  9:00 AM EDT by (9;47am) telephone and verified that I am speaking with the correct person using two identifiers.Ms Karpinski is in her home and I am at the office, she has agreed to televist   I discussed the limitations, risks, security and privacy concerns of performing an evaluation and management service by telephone and the availability of in person appointments. I also discussed with the patient that there may be a patient responsible charge related to this service. The patient expressed understanding and agreed to proceed.   History of Present Illness:follow up chronic illnesses, review of recent labs and medication refills. Has a had no consultations since last visit No new concerns, except that she does report not exercising as much as she used to , however , has maintained diabetic diet Has been taking  Reduced dose of baclofen and has no problem with this Labs are reviewed at this visit    Observations/Objective: ENT: Denies nasal congestion, sinus pressure , sore throat or ear pain  RESP: Denies cough, sputum production , shortness of breath or wheezing  CVS: Denies chest pain, palpitation , leg swelling or orthopnea  GI:  Denies abdominal pain, nausea, vomiting , diarrhea or constipation  GU: Denies dysuria , frequency or incontinence  MS: denies uncontrolled  joint pain, swelling or instability  SKIN: Denies rash or skin breakdown  PSYCH: Denies depression, anxiety  or insomnia     Assessment and Plan: Essential hypertension DASH diet and commitment to daily physical activity for a minimum of 30 minutes discussed and encouraged, as a part of hypertension management. The importance of attaining a healthy weight is also discussed. No med change as review shows control BP/Weight 07/03/2018 04/09/2018 03/31/2018 09/16/2017 04/30/2017 03/04/2017 11/05/2016  Systolic BP 120 128 130 110 110 170 120   Diastolic BP 80 82 72 78 80 100 84  Wt. (Lbs) 203 202.8 201.12 190 196 223 212.12  BMI 32.77 32.73 32.46 31.62 32.62 37.11 35.3       Obesity (BMI 30-39.9)   Patient re-educated about  the importance of commitment to a  minimum of 150 minutes of exercise per week as able.  The importance of healthy food choices with portion control discussed, as well as eating regularly and within a 12 hour window most days. The need to choose "clean , green" food 50 to 75% of the time is discussed, as well as to make water the primary drink and set a goal of 64 ounces water daily.  Encouraged to start a food diary,  and to consider  joining a support group. Sample diet sheets offered. Goals set by the patient for the next several months.   Weight /BMI 07/03/2018 04/09/2018 03/31/2018  WEIGHT 203 lb 202 lb 12.8 oz 201 lb 1.9 oz  HEIGHT 5\' 6"  5\' 6"  5\' 6"   BMI 32.77 kg/m2 32.73 kg/m2 32.46 kg/m2      Type 2 diabetes mellitus with vascular disease (HCC) Ms. Borton is reminded of the importance of commitment to daily physical activity for 30 minutes or more, as able and the need to limit carbohydrate intake to 30 to 60 grams per meal to help with blood sugar control.   The need to take medication as prescribed, test blood sugar as directed, and to call between visits if there is a concern that blood sugar is uncontrolled is also discussed.   Ms. Misa is reminded of  the importance of daily foot exam, annual eye examination, and good blood sugar, blood  pressure and cholesterol control. Improved, no med change  Diabetic Labs Latest Ref Rng & Units 09/12/2018 07/03/2018 03/31/2018 08/09/2017 04/25/2017  HbA1c <5.7 % of total Hgb 6.4(H) - 6.8(H) 6.4(H) 6.7(H)  Microalbumin Not Estab. ug/mL - 6.2(H) - - 0.9  Micro/Creat Ratio <30 mcg/mg creat - - - - 6  Chol <200 mg/dL 893 - 810 175 -  HDL > OR = 50 mg/dL 10(C) - 58(N) 27(P) -  Calc LDL mg/dL (calc) 58 - 824(M) 98 -  Triglycerides <150 mg/dL 80 - 65 70 -   Creatinine 0.50 - 1.05 mg/dL 3.53(I) - 1.44 3.15 4.00   BP/Weight 07/03/2018 04/09/2018 03/31/2018 09/16/2017 04/30/2017 03/04/2017 11/05/2016  Systolic BP 120 128 130 110 110 170 120  Diastolic BP 80 82 72 78 80 100 84  Wt. (Lbs) 203 202.8 201.12 190 196 223 212.12  BMI 32.77 32.73 32.46 31.62 32.62 37.11 35.3   Foot/eye exam completion dates Latest Ref Rng & Units 07/11/2018 07/03/2018  Eye Exam No Retinopathy No Retinopathy -  Foot Form Completion - - Done        Spasm of muscle Controlled on current dose of baclofen  Depression Controlled, however , will simplify to 1 agent, effexor and wean off of fluoxetine. Take fluoxetine on mon, Wed and Friday of this week only then stop, and increase effexor to 75 mg dose starting on Sunday  Hyperlipidemia associated with type 2 diabetes mellitus (HCC) Hyperlipidemia:Low fat diet discussed and encouraged.   Lipid Panel  Lab Results  Component Value Date   CHOL 111 09/12/2018   HDL 37 (L) 09/12/2018   LDLCALC 58 09/12/2018   TRIG 80 09/12/2018   CHOLHDL 3.0 09/12/2018   Needs to increase exercise, no med change      Follow Up Instructions:    I discussed the assessment and treatment plan with the patient. The patient was provided an opportunity to ask questions and all were answered. The patient agreed with the plan and demonstrated an understanding of the instructions.   The patient was advised to call back or seek an in-person evaluation if the symptoms worsen or if the condition fails to improve as anticipated.  I provided 30  minutes of non-face-to-face time during this encounter.   [name], MD

## 2018-09-17 ENCOUNTER — Encounter: Payer: Self-pay | Admitting: Family Medicine

## 2018-09-17 ENCOUNTER — Telehealth: Payer: Self-pay

## 2018-09-17 DIAGNOSIS — E1159 Type 2 diabetes mellitus with other circulatory complications: Secondary | ICD-10-CM

## 2018-09-17 DIAGNOSIS — E785 Hyperlipidemia, unspecified: Secondary | ICD-10-CM

## 2018-09-17 DIAGNOSIS — I10 Essential (primary) hypertension: Secondary | ICD-10-CM

## 2018-09-17 DIAGNOSIS — E1169 Type 2 diabetes mellitus with other specified complication: Secondary | ICD-10-CM | POA: Insufficient documentation

## 2018-09-17 NOTE — Assessment & Plan Note (Signed)
Hyperlipidemia:Low fat diet discussed and encouraged.   Lipid Panel  Lab Results  Component Value Date   CHOL 111 09/12/2018   HDL 37 (L) 09/12/2018   LDLCALC 58 09/12/2018   TRIG 80 09/12/2018   CHOLHDL 3.0 09/12/2018   Needs to increase exercise, no med change

## 2018-09-17 NOTE — Assessment & Plan Note (Signed)
DASH diet and commitment to daily physical activity for a minimum of 30 minutes discussed and encouraged, as a part of hypertension management. The importance of attaining a healthy weight is also discussed. No med change as review shows control BP/Weight 07/03/2018 04/09/2018 03/31/2018 09/16/2017 04/30/2017 03/04/2017 11/05/2016  Systolic BP 120 128 130 110 110 170 120  Diastolic BP 80 82 72 78 80 100 84  Wt. (Lbs) 203 202.8 201.12 190 196 223 212.12  BMI 32.77 32.73 32.46 31.62 32.62 37.11 35.3

## 2018-09-17 NOTE — Assessment & Plan Note (Signed)
   Patient re-educated about  the importance of commitment to a  minimum of 150 minutes of exercise per week as able.  The importance of healthy food choices with portion control discussed, as well as eating regularly and within a 12 hour window most days. The need to choose "clean , green" food 50 to 75% of the time is discussed, as well as to make water the primary drink and set a goal of 64 ounces water daily.  Encouraged to start a food diary,  and to consider  joining a support group. Sample diet sheets offered. Goals set by the patient for the next several months.   Weight /BMI 07/03/2018 04/09/2018 03/31/2018  WEIGHT 203 lb 202 lb 12.8 oz 201 lb 1.9 oz  HEIGHT 5\' 6"  5\' 6"  5\' 6"   BMI 32.77 kg/m2 32.73 kg/m2 32.46 kg/m2

## 2018-09-17 NOTE — Assessment & Plan Note (Signed)
Brittany Nunez is reminded of the importance of commitment to daily physical activity for 30 minutes or more, as able and the need to limit carbohydrate intake to 30 to 60 grams per meal to help with blood sugar control.   The need to take medication as prescribed, test blood sugar as directed, and to call between visits if there is a concern that blood sugar is uncontrolled is also discussed.   Brittany Nunez is reminded of the importance of daily foot exam, annual eye examination, and good blood sugar, blood  pressure and cholesterol control. Improved, no med change  Diabetic Labs Latest Ref Rng & Units 09/12/2018 07/03/2018 03/31/2018 08/09/2017 04/25/2017  HbA1c <5.7 % of total Hgb 6.4(H) - 6.8(H) 6.4(H) 6.7(H)  Microalbumin Not Estab. ug/mL - 6.2(H) - - 0.9  Micro/Creat Ratio <30 mcg/mg creat - - - - 6  Chol <200 mg/dL 527 - 782 423 -  HDL > OR = 50 mg/dL 53(I) - 14(E) 31(V) -  Calc LDL mg/dL (calc) 58 - 400(Q) 98 -  Triglycerides <150 mg/dL 80 - 65 70 -  Creatinine 0.50 - 1.05 mg/dL 6.76(P) - 9.50 9.32 6.71   BP/Weight 07/03/2018 04/09/2018 03/31/2018 09/16/2017 04/30/2017 03/04/2017 11/05/2016  Systolic BP 120 128 130 110 110 170 120  Diastolic BP 80 82 72 78 80 100 84  Wt. (Lbs) 203 202.8 201.12 190 196 223 212.12  BMI 32.77 32.73 32.46 31.62 32.62 37.11 35.3   Foot/eye exam completion dates Latest Ref Rng & Units 07/11/2018 07/03/2018  Eye Exam No Retinopathy No Retinopathy -  Foot Form Completion - - Done

## 2018-09-17 NOTE — Assessment & Plan Note (Signed)
Controlled on current dose of baclofen

## 2018-09-17 NOTE — Assessment & Plan Note (Signed)
Controlled, however , will simplify to 1 agent, effexor and wean off of fluoxetine. Take fluoxetine on mon, Wed and Friday of this week only then stop, and increase effexor to 75 mg dose starting on Sunday

## 2018-09-17 NOTE — Patient Instructions (Addendum)
F/U in early  September, call if you need me sooner  Please schedule screening mammogram for August , if possible   Please start once daily coated aspirin 81 mg , for heart protection   Take fluoxetine once daily this week Monday, Wednesday and Friday only, and then STOP.  Starting on Sunday March 29, INCREASED DOSE OF VENLAFAXINE  to 75 mg once daily, and NO MORE fluoxetine.You may take two venlafaxine 37.5 mg tablets together every day, until they are finished , before filling the new higher tablet  Non fasting HBa1C, cmp and EGFR last week in August  It is important that you exercise regularly at least 30 minutes 5 times a week. If you develop chest pain, have severe difficulty breathing, or feel very tired, stop exercising immediately and seek medical attention  Think about what you will eat, plan ahead. Choose " clean, green, fresh or frozen" over canned, processed or packaged foods which are more sugary, salty and fatty. 70 to 75% of food eaten should be vegetables and fruit. Three meals at set times with snacks allowed between meals, but they must be fruit or vegetables. Aim to eat over a 12 hour period , example 7 am to 7 pm, and STOP after  your last meal of the day. Drink water,generally about 64 ounces per day, no other drink is as healthy. Fruit juice is best enjoyed in a healthy way, by EATING the fruit. Thank you  for choosing [facility]. We consider it a privelige to serve you.  Delivering excellent health care in a caring and  compassionate way is our goal.  Partnering with you,  so that together we can achieve this goal is our strategy.

## 2018-09-17 NOTE — Telephone Encounter (Signed)
Labs ordered per AVS 

## 2018-10-27 ENCOUNTER — Other Ambulatory Visit: Payer: Self-pay | Admitting: Family Medicine

## 2018-10-30 ENCOUNTER — Other Ambulatory Visit: Payer: Self-pay

## 2018-10-30 MED ORDER — VENLAFAXINE HCL ER 75 MG PO CP24: 75.0000 mg | ORAL_CAPSULE | Freq: Every day | ORAL | 5 refills | Status: DC

## 2018-11-06 ENCOUNTER — Other Ambulatory Visit: Payer: Self-pay | Admitting: Family Medicine

## 2018-11-10 ENCOUNTER — Other Ambulatory Visit: Payer: Self-pay | Admitting: Family Medicine

## 2018-11-20 ENCOUNTER — Encounter: Payer: Self-pay | Admitting: *Deleted

## 2018-12-03 ENCOUNTER — Other Ambulatory Visit: Payer: Self-pay | Admitting: Family Medicine

## 2018-12-12 ENCOUNTER — Other Ambulatory Visit: Payer: Self-pay | Admitting: Family Medicine

## 2018-12-16 ENCOUNTER — Telehealth: Payer: Self-pay | Admitting: Family Medicine

## 2018-12-16 NOTE — Telephone Encounter (Signed)
rx faxed to [facility].  

## 2018-12-16 NOTE — Telephone Encounter (Signed)
Beau Fanny is calling from [employer]- to get a verbal or rx for Temazepam 30mg 

## 2018-12-23 ENCOUNTER — Other Ambulatory Visit: Payer: Self-pay | Admitting: Family Medicine

## 2019-01-12 ENCOUNTER — Other Ambulatory Visit: Payer: Self-pay | Admitting: Family Medicine

## 2019-01-14 ENCOUNTER — Telehealth: Payer: Self-pay | Admitting: *Deleted

## 2019-01-14 NOTE — Telephone Encounter (Signed)
Pt called needing her baclofen sent in to [facility] in [place]

## 2019-01-15 ENCOUNTER — Other Ambulatory Visit: Payer: Self-pay

## 2019-01-15 MED ORDER — BACLOFEN 10 MG PO TABS: ORAL_TABLET | ORAL | 5 refills | Status: DC

## 2019-01-15 NOTE — Telephone Encounter (Signed)
Sent in

## 2019-02-11 ENCOUNTER — Other Ambulatory Visit: Payer: Self-pay

## 2019-02-11 ENCOUNTER — Ambulatory Visit (HOSPITAL_COMMUNITY)
Admission: RE | Admit: 2019-02-11 | Discharge: 2019-02-11 | Disposition: A | Discharge status: Home / Self Care | Payer: BC Managed Care – PPO | Source: Ambulatory Visit | Attending: Family Medicine | Admitting: Family Medicine

## 2019-02-11 DIAGNOSIS — Z1231 Encounter for screening mammogram for malignant neoplasm of breast: Secondary | ICD-10-CM | POA: Diagnosis not present

## 2019-02-19 ENCOUNTER — Other Ambulatory Visit: Payer: Self-pay | Admitting: Family Medicine

## 2019-03-09 ENCOUNTER — Other Ambulatory Visit (HOSPITAL_COMMUNITY)
Admission: RE | Admit: 2019-03-09 | Discharge: 2019-03-09 | Disposition: A | Discharge status: Home / Self Care | Payer: BC Managed Care – PPO | Source: Ambulatory Visit | Attending: Family Medicine | Admitting: Family Medicine

## 2019-03-09 ENCOUNTER — Other Ambulatory Visit: Payer: Self-pay

## 2019-03-09 ENCOUNTER — Encounter: Payer: Self-pay | Admitting: Family Medicine

## 2019-03-09 ENCOUNTER — Ambulatory Visit (INDEPENDENT_AMBULATORY_CARE_PROVIDER_SITE_OTHER): Payer: BC Managed Care – PPO | Admitting: Family Medicine

## 2019-03-09 VITALS — BP 110/80 | HR 96 | Temp 97.4°F | Resp 15 | Ht 66.0 in | Wt 213.0 lb

## 2019-03-09 DIAGNOSIS — Z90710 Acquired absence of both cervix and uterus: Secondary | ICD-10-CM | POA: Insufficient documentation

## 2019-03-09 DIAGNOSIS — E669 Obesity, unspecified: Secondary | ICD-10-CM | POA: Diagnosis not present

## 2019-03-09 DIAGNOSIS — F329 Major depressive disorder, single episode, unspecified: Secondary | ICD-10-CM

## 2019-03-09 DIAGNOSIS — G8929 Other chronic pain: Secondary | ICD-10-CM

## 2019-03-09 DIAGNOSIS — R87811 Vaginal high risk human papillomavirus (HPV) DNA test positive: Secondary | ICD-10-CM | POA: Diagnosis not present

## 2019-03-09 DIAGNOSIS — E1169 Type 2 diabetes mellitus with other specified complication: Secondary | ICD-10-CM

## 2019-03-09 DIAGNOSIS — M5136 Other intervertebral disc degeneration, lumbar region: Secondary | ICD-10-CM

## 2019-03-09 DIAGNOSIS — E785 Hyperlipidemia, unspecified: Secondary | ICD-10-CM

## 2019-03-09 DIAGNOSIS — N760 Acute vaginitis: Secondary | ICD-10-CM

## 2019-03-09 DIAGNOSIS — E559 Vitamin D deficiency, unspecified: Secondary | ICD-10-CM | POA: Diagnosis not present

## 2019-03-09 DIAGNOSIS — Z1272 Encounter for screening for malignant neoplasm of vagina: Secondary | ICD-10-CM | POA: Diagnosis not present

## 2019-03-09 DIAGNOSIS — M5126 Other intervertebral disc displacement, lumbar region: Secondary | ICD-10-CM | POA: Diagnosis not present

## 2019-03-09 DIAGNOSIS — B379 Candidiasis, unspecified: Secondary | ICD-10-CM | POA: Diagnosis not present

## 2019-03-09 DIAGNOSIS — M5441 Lumbago with sciatica, right side: Secondary | ICD-10-CM | POA: Diagnosis not present

## 2019-03-09 DIAGNOSIS — Z124 Encounter for screening for malignant neoplasm of cervix: Secondary | ICD-10-CM | POA: Diagnosis present

## 2019-03-09 DIAGNOSIS — E1159 Type 2 diabetes mellitus with other circulatory complications: Secondary | ICD-10-CM | POA: Diagnosis not present

## 2019-03-09 DIAGNOSIS — Z1151 Encounter for screening for human papillomavirus (HPV): Secondary | ICD-10-CM | POA: Insufficient documentation

## 2019-03-09 DIAGNOSIS — I10 Essential (primary) hypertension: Secondary | ICD-10-CM | POA: Diagnosis not present

## 2019-03-09 DIAGNOSIS — Z23 Encounter for immunization: Secondary | ICD-10-CM

## 2019-03-09 MED ORDER — OMEPRAZOLE 20 MG PO CPDR: DELAYED_RELEASE_CAPSULE | ORAL | 1 refills | Status: DC

## 2019-03-09 MED ORDER — SITAGLIP PHOS-METFORMIN HCL ER 50-1000 MG PO TB24: 2.0000 | ORAL_TABLET | Freq: Every day | ORAL | 5 refills | Status: DC

## 2019-03-09 MED ORDER — KETOROLAC TROMETHAMINE 60 MG/2ML IM SOLN
60.0000 mg | Freq: Once | INTRAMUSCULAR | Status: AC
  Administered 2019-03-09: 60 mg via INTRAMUSCULAR

## 2019-03-09 MED ORDER — BACLOFEN 10 MG PO TABS: 10.0000 mg | ORAL_TABLET | Freq: Three times a day (TID) | ORAL | 3 refills | Status: DC

## 2019-03-09 MED ORDER — METHYLPREDNISOLONE ACETATE 80 MG/ML IJ SUSP
80.0000 mg | Freq: Once | INTRAMUSCULAR | Status: AC
  Administered 2019-03-09: 80 mg via INTRAMUSCULAR

## 2019-03-09 MED ORDER — ROSUVASTATIN CALCIUM 5 MG PO TABS: 5.0000 mg | ORAL_TABLET | Freq: Every day | ORAL | 3 refills | Status: DC

## 2019-03-09 MED ORDER — IBUPROFEN 800 MG PO TABS: 800.0000 mg | ORAL_TABLET | Freq: Three times a day (TID) | ORAL | 0 refills | Status: DC | PRN

## 2019-03-09 MED ORDER — FLUCONAZOLE 150 MG PO TABS: ORAL_TABLET | ORAL | 1 refills | Status: DC

## 2019-03-09 MED ORDER — PREDNISONE 5 MG (21) PO TBPK: 5.0000 mg | ORAL_TABLET | ORAL | 0 refills | Status: DC

## 2019-03-09 MED ORDER — CLOTRIMAZOLE-BETAMETHASONE 1-0.05 % EX CREA: 1.0000 "application " | TOPICAL_CREAM | Freq: Two times a day (BID) | CUTANEOUS | 1 refills | Status: DC

## 2019-03-09 MED ORDER — TERBINAFINE HCL 250 MG PO TABS: 250.0000 mg | ORAL_TABLET | Freq: Every day | ORAL | 0 refills | Status: DC

## 2019-03-09 NOTE — Assessment & Plan Note (Addendum)
Terbinafine x 2 weeks, and specimen sent for testing, also fluconazole

## 2019-03-09 NOTE — Assessment & Plan Note (Signed)
Reports uncontrolled sugar x 1 month Lab today  Brittany Nunez is reminded of the importance of commitment to daily physical activity for 30 minutes or more, as able and the need to limit carbohydrate intake to 30 to 60 grams per meal to help with blood sugar control.   The need to take medication as prescribed, test blood sugar as directed, and to call between visits if there is a concern that blood sugar is uncontrolled is also discussed.   Brittany Nunez is reminded of the importance of daily foot exam, annual eye examination, and good blood sugar, blood pressure and cholesterol control.  Diabetic Labs Latest Ref Rng & Units 09/12/2018 07/03/2018 03/31/2018 08/09/2017 04/25/2017  HbA1c <5.7 % of total Hgb 6.4(H) - 6.8(H) 6.4(H) 6.7(H)  Microalbumin Not Estab. ug/mL - 6.2(H) - - 0.9  Micro/Creat Ratio <30 mcg/mg creat - - - - 6  Chol <200 mg/dL 111 - 194 155 -  HDL > OR = 50 mg/dL 37(L) - 44(L) 41(L) -  Calc LDL mg/dL (calc) 58 - 134(H) 98 -  Triglycerides <150 mg/dL 80 - 65 70 -  Creatinine 0.50 - 1.05 mg/dL 1.06(H) - 0.92 0.93 0.93   BP/Weight 03/09/2019 07/03/2018 04/09/2018 03/31/2018 09/16/2017 04/30/2017 2/68/3419  Systolic BP 622 297 989 211 941 740 814  Diastolic BP 80 80 82 72 78 80 100  Wt. (Lbs) 213 203 202.8 201.12 190 196 223  BMI 34.38 32.77 32.73 32.46 31.62 32.62 37.11   Foot/eye exam completion dates Latest Ref Rng & Units 07/11/2018 07/03/2018  Eye Exam No Retinopathy No Retinopathy -  Foot Form Completion - - Done

## 2019-03-09 NOTE — Assessment & Plan Note (Signed)
Controlled, no change in medication DASH diet and commitment to daily physical activity for a minimum of 30 minutes discussed and encouraged, as a part of hypertension management. The importance of attaining a healthy weight is also discussed.  BP/Weight 03/09/2019 07/03/2018 04/09/2018 03/31/2018 09/16/2017 04/30/2017 10/01/1446  Systolic BP 185 631 497 026 378 588 502  Diastolic BP 80 80 82 72 78 80 100  Wt. (Lbs) 213 203 202.8 201.12 190 196 223  BMI 34.38 32.77 32.73 32.46 31.62 32.62 37.11

## 2019-03-09 NOTE — Assessment & Plan Note (Signed)
Controlled, no change in medication  

## 2019-03-09 NOTE — Assessment & Plan Note (Signed)
  Patient re-educated about  the importance of commitment to a  minimum of 150 minutes of exercise per week as able.  The importance of healthy food choices with portion control discussed, as well as eating regularly and within a 12 hour window most days. The need to choose "clean , green" food 50 to 75% of the time is discussed, as well as to make water the primary drink and set a goal of 64 ounces water daily.    Weight /BMI 03/09/2019 07/03/2018 04/09/2018  WEIGHT 213 lb 203 lb 202 lb 12.8 oz  HEIGHT 5\' 6"  5\' 6"  5\' 6"   BMI 34.38 kg/m2 32.77 kg/m2 32.73 kg/m2

## 2019-03-09 NOTE — Assessment & Plan Note (Signed)
Uncontrolled.Toradol and depo medrol administered IM in the office , to be followed by a short course of oral prednisone and NSAIDS.  

## 2019-03-09 NOTE — Assessment & Plan Note (Signed)
Current flare in groin with thick white vaginal d/c , oral fluconazole and specimen sent for testing

## 2019-03-09 NOTE — Progress Notes (Signed)
Brittany Nunez     MRN: [id number]      DOB: 01-Jul-1960   HPI Brittany Nunez is here for follow up and re-evaluation of chronic medical conditions, medication management and review of any available recent lab and radiology data.  Preventive health is updated, specifically  Cancer screening and Immunization.   Questions or concerns regarding consultations or procedures which the PT has had in the interim are  addressed. The PT denies any adverse reactions to current medications since the last visit.  Flare of groin rash x 1 month, no drainage Notes that blood sugar control has not been good in the past 1 month, as high as 176 No fever , chills or purulent drainage 1 week of  Right sciatica to knee Eating poorly with 15 pound weight gain, and is exercising less, will change both ROS Denies recent fever or chills. Denies sinus pressure, nasal congestion, ear pain or sore throat. Denies chest congestion, productive cough or wheezing. Denies chest pains, palpitations and leg swelling Denies abdominal pain, nausea, vomiting,diarrhea or constipation.   Denies dysuria, frequency, hesitancy or incontinence.  Denies headaches, seizures, numbness, or tingling. Denies depression, anxiety or insomnia. Denies skin break down or rash.   PE  BP 104/74   Pulse (!) 104   Temp (!) 97.4 F (36.3 C) (Temporal)   Resp 15   Ht 5\' 6"  (1.676 m)   Wt 213 lb (96.6 kg)   SpO2 98%   BMI 34.38 kg/m   Patient alert and oriented and in no cardiopulmonary distress.  HEENT: No facial asymmetry, EOMI,   oropharynx pink and moist.  Neck supple no JVD, no mass.  Chest: Clear to auscultation bilaterally.  CVS: S1, S2 no murmurs, no S3.Regular rate.  ABD: Soft non tender.  Pelvic: thick white discharge in vaginal canal and tissue erythematous. No ulcers. Skin in inguinal folds erythematous and thin Cervix and womb absent, no adnexal mass or tenderness.External genitalia normal.  Ext: No edema  MS: decreased   ROM lumbar spine,adequate in  shoulders, hips and knees.  Skin: erythematous rash with peeling of skin in groin  Psych: Good eye contact, normal affect. Memory intact not anxious or depressed appearing.  CNS: CN 2-12 intact, power,  normal throughout.no focal deficits noted.   Assessment & Plan  Back pain Uncontrolled.Toradol and depo medrol administered IM in the office , to be followed by a short course of oral prednisone and NSAIDS.   Essential hypertension Controlled, no change in medication DASH diet and commitment to daily physical activity for a minimum of 30 minutes discussed and encouraged, as a part of hypertension management. The importance of attaining a healthy weight is also discussed.  BP/Weight 03/09/2019 07/03/2018 04/09/2018 03/31/2018 09/16/2017 04/30/2017 03/04/2017  Systolic BP 110 120 128 130 110 110 170  Diastolic BP 80 80 82 72 78 80 409100  Wt. (Lbs) 213 203 202.8 201.12 190 196 223  BMI 34.38 32.77 32.73 32.46 31.62 32.62 37.11       Bulge of lumbar disc without myelopathy Uncontrolled.Toradol and depo medrol administered IM in the office , to be followed by a short course of oral prednisone and NSAIDS.   Candidiasis Current flare in groin with thick white vaginal d/c , oral fluconazole and specimen sent for testing  Vulvovaginitis Terbinafine x 2 weeks, and specimen sent for testing, also fluconazole   Type 2 diabetes mellitus with vascular disease (HCC) Reports uncontrolled sugar x 1 month Lab today  Brittany Nunez is  reminded of the importance of commitment to daily physical activity for 30 minutes or more, as able and the need to limit carbohydrate intake to 30 to 60 grams per meal to help with blood sugar control.   The need to take medication as prescribed, test blood sugar as directed, and to call between visits if there is a concern that blood sugar is uncontrolled is also discussed.   Brittany Nunez is reminded of the importance of daily foot exam, annual  eye examination, and good blood sugar, blood pressure and cholesterol control.  Diabetic Labs Latest Ref Rng & Units 09/12/2018 07/03/2018 03/31/2018 08/09/2017 04/25/2017  HbA1c <5.7 % of total Hgb 6.4(H) - 6.8(H) 6.4(H) 6.7(H)  Microalbumin Not Estab. ug/mL - 6.2(H) - - 0.9  Micro/Creat Ratio <30 mcg/mg creat - - - - 6  Chol <200 mg/dL 111 - 194 155 -  HDL > OR = 50 mg/dL 37(L) - 44(L) 41(L) -  Calc LDL mg/dL (calc) 58 - 134(H) 98 -  Triglycerides <150 mg/dL 80 - 65 70 -  Creatinine 0.50 - 1.05 mg/dL 1.06(H) - 0.92 0.93 0.93   BP/Weight 03/09/2019 07/03/2018 04/09/2018 03/31/2018 09/16/2017 04/30/2017 2/70/3500  Systolic BP 938 182 993 716 967 893 810  Diastolic BP 80 80 82 72 78 80 100  Wt. (Lbs) 213 203 202.8 201.12 190 196 223  BMI 34.38 32.77 32.73 32.46 31.62 32.62 37.11   Foot/eye exam completion dates Latest Ref Rng & Units 07/11/2018 07/03/2018  Eye Exam No Retinopathy No Retinopathy -  Foot Form Completion - - Done        Obesity (BMI 30-39.9)  Patient re-educated about  the importance of commitment to a  minimum of 150 minutes of exercise per week as able.  The importance of healthy food choices with portion control discussed, as well as eating regularly and within a 12 hour window most days. The need to choose "clean , green" food 50 to 75% of the time is discussed, as well as to make water the primary drink and set a goal of 64 ounces water daily.    Weight /BMI 03/09/2019 07/03/2018 04/09/2018  WEIGHT 213 lb 203 lb 202 lb 12.8 oz  HEIGHT 5\' 6"  5\' 6"  5\' 6"   BMI 34.38 kg/m2 32.77 kg/m2 32.73 kg/m2      Depression Controlled, no change in medication

## 2019-03-09 NOTE — Patient Instructions (Addendum)
F/u in 4 months, call if you need me sooner  Please help pt with My chart sign up at checkout  Lipid, cmp and EGr , HBA1C, CBC, vit D today Flu vaccine today  Injections for sciatica today followed by short course of ibuprofen and prednisone   Medication is sent for rash, two tablets and one cream  Pap sent today  It is important that you exercise regularly at least 30 minutes 5 times a week. If you develop chest pain, have severe difficulty breathing, or feel very tired, stop exercising immediately and seek medical attention  Think about what you will eat, plan ahead. Choose " clean, green, fresh or frozen" over canned, processed or packaged foods which are more sugary, salty and fatty. 70 to 75% of food eaten should be vegetables and fruit. Three meals at set times with snacks allowed between meals, but they must be fruit or vegetables. Aim to eat over a 12 hour period , example 7 am to 7 pm, and STOP after  your last meal of the day. Drink water,generally about 64 ounces per day, no other drink is as healthy. Fruit juice is best enjoyed in a healthy way, by EATING the fruit.

## 2019-03-10 LAB — CYTOLOGY - PAP
Diagnosis: NEGATIVE
HPV: DETECTED — AB

## 2019-03-11 LAB — CERVICOVAGINAL ANCILLARY ONLY
Bacterial vaginitis: NEGATIVE
Candida vaginitis: POSITIVE — AB
Chlamydia: NEGATIVE
Neisseria Gonorrhea: NEGATIVE
Trichomonas: NEGATIVE

## 2019-03-12 LAB — LIPID PANEL
Cholesterol: 112 mg/dL (ref <200)
HDL: 39 mg/dL — ABNORMAL LOW (ref >=50)
LDL Cholesterol (Calc): 59 mg/dL (calc)
Non-HDL Cholesterol (Calc): 73 mg/dL (calc) (ref <130)
Total CHOL/HDL Ratio: 2.9 (calc) (ref <5.0)
Triglycerides: 64 mg/dL (ref <150)

## 2019-03-12 LAB — COMPLETE METABOLIC PANEL WITH GFR
AG Ratio: 1.5 (calc) (ref 1.0–2.5)
ALT: 15 U/L (ref 6–29)
AST: 14 U/L (ref 10–35)
Albumin: 4 g/dL (ref 3.6–5.1)
Alkaline phosphatase (APISO): 93 U/L (ref 37–153)
BUN/Creatinine Ratio: 8 (calc) (ref 6–22)
BUN: 9 mg/dL (ref 7–25)
CO2: 28 mmol/L (ref 20–32)
Calcium: 9 mg/dL (ref 8.6–10.4)
Chloride: 105 mmol/L (ref 98–110)
Creat: 1.09 mg/dL — ABNORMAL HIGH (ref 0.50–1.05)
GFR, Est African American: 64 mL/min/{1.73_m2} (ref >=60)
GFR, Est Non African American: 56 mL/min/{1.73_m2} — ABNORMAL LOW (ref >=60)
Globulin: 2.7 g/dL (calc) (ref 1.9–3.7)
Glucose, Bld: 141 mg/dL — ABNORMAL HIGH (ref 65–99)
Potassium: 4.3 mmol/L (ref 3.5–5.3)
Sodium: 141 mmol/L (ref 135–146)
Total Bilirubin: 0.3 mg/dL (ref 0.2–1.2)
Total Protein: 6.7 g/dL (ref 6.1–8.1)

## 2019-03-12 LAB — VITAMIN D 25 HYDROXY (VIT D DEFICIENCY, FRACTURES): Vit D, 25-Hydroxy: 20 ng/mL — ABNORMAL LOW (ref 30–100)

## 2019-03-12 LAB — HEMOGLOBIN A1C
Hgb A1c MFr Bld: 7.4 % of total Hgb — ABNORMAL HIGH (ref <5.7)
Mean Plasma Glucose: 166 (calc)
eAG (mmol/L): 9.2 (calc)

## 2019-03-12 LAB — CBC
HCT: 34.9 % — ABNORMAL LOW (ref 35.0–45.0)
Hemoglobin: 11.3 g/dL — ABNORMAL LOW (ref 11.7–15.5)
MCH: 25.6 pg — ABNORMAL LOW (ref 27.0–33.0)
MCHC: 32.4 g/dL (ref 32.0–36.0)
MCV: 79 fL — ABNORMAL LOW (ref 80.0–100.0)
MPV: 9.5 fL (ref 7.5–12.5)
Platelets: 379 10*3/uL (ref 140–400)
RBC: 4.42 10*6/uL (ref 3.80–5.10)
RDW: 14.5 % (ref 11.0–15.0)
WBC: 8.3 10*3/uL (ref 3.8–10.8)

## 2019-03-12 LAB — FERRITIN: Ferritin: 26 ng/mL (ref 16–232)

## 2019-03-12 LAB — TEST AUTHORIZATION

## 2019-03-12 LAB — IRON: Iron: 49 ug/dL (ref 45–160)

## 2019-03-16 ENCOUNTER — Encounter: Payer: Self-pay | Admitting: Family Medicine

## 2019-04-07 ENCOUNTER — Other Ambulatory Visit: Payer: Self-pay | Admitting: Family Medicine

## 2019-04-15 ENCOUNTER — Ambulatory Visit: Payer: BLUE CROSS/BLUE SHIELD | Admitting: Family Medicine

## 2019-04-15 ENCOUNTER — Ambulatory Visit: Payer: BLUE CROSS/BLUE SHIELD

## 2019-04-20 ENCOUNTER — Ambulatory Visit: Payer: BLUE CROSS/BLUE SHIELD

## 2019-04-23 ENCOUNTER — Other Ambulatory Visit: Payer: Self-pay

## 2019-04-23 ENCOUNTER — Encounter: Payer: Medicare Other | Admitting: Family Medicine

## 2019-04-27 ENCOUNTER — Other Ambulatory Visit: Payer: Self-pay | Admitting: Family Medicine

## 2019-04-27 ENCOUNTER — Other Ambulatory Visit: Payer: Self-pay

## 2019-04-27 ENCOUNTER — Encounter: Payer: Self-pay | Admitting: Family Medicine

## 2019-04-27 ENCOUNTER — Ambulatory Visit (INDEPENDENT_AMBULATORY_CARE_PROVIDER_SITE_OTHER): Payer: BC Managed Care – PPO | Admitting: Family Medicine

## 2019-04-27 VITALS — BP 119/82 | Ht 66.0 in | Wt 215.0 lb

## 2019-04-27 DIAGNOSIS — Z0000 Encounter for general adult medical examination without abnormal findings: Secondary | ICD-10-CM

## 2019-04-27 NOTE — Patient Instructions (Signed)
Brittany Nunez , Thank you for taking time to come for your Medicare Wellness Visit. I appreciate your ongoing commitment to your health goals. Please review the following plan we discussed and let me know if I can assist you in the future.   Please continue to practice social distancing to keep you, your family, and our community safe.  If you must go out, please wear a Mask and practice good handwashing.  We have that you have a happy, safe, wonderful, healthy holiday season please let us know if you need [place] before your next appointment.  Screening recommendations/referrals: Colonoscopy: Due 2023 Mammogram: Up-to-date Bone Density: Due at age 42 Recommended yearly ophthalmology/optometry visit for glaucoma screening and checkup Recommended yearly dental visit for hygiene and checkup  Vaccinations: Influenza vaccine: Completed, due again in the fall 2021 Pneumococcal vaccine: Up-to-date, needs 82 after age 46 Tdap vaccine: Up-to-date Shingles vaccine: Call insurance company for coverage  Advanced directives: Let us know if you want any paperwork on this  Conditions/risks identified: Falls  Next appointment: 07/09/2019   Preventive Care 40-64 Years, Female Preventive care refers to lifestyle choices and visits with your health care provider that can promote health and wellness. What does preventive care include?  A yearly physical exam. This is also called an annual well check.  Dental exams once or twice a year.  Routine eye exams. Ask your health care provider how often you should have your eyes checked.  Personal lifestyle choices, including:  Daily care of your teeth and gums.  Regular physical activity.  Eating a healthy diet.  Avoiding tobacco and drug use.  Limiting alcohol use.  Practicing safe sex.  Taking low-dose aspirin daily starting at age 68.  Taking vitamin and mineral supplements as recommended by your health care provider. What happens during an  annual well check? The services and screenings done by your health care provider during your annual well check will depend on your age, overall health, lifestyle risk factors, and family history of disease. Counseling  Your health care provider may ask you questions about your:  Alcohol use.  Tobacco use.  Drug use.  Emotional well-being.  Home and relationship well-being.  Sexual activity.  Eating habits.  Work and work Statistician.  Method of birth control.  Menstrual cycle.  Pregnancy history. Screening  You may have the following tests or measurements:  Height, weight, and BMI.  Blood pressure.  Lipid and cholesterol levels. These may be checked every 5 years, or more frequently if you are over 65 years old.  Skin check.  Lung cancer screening. You may have this screening every year starting at age 22 if you have a 30-pack-year history of smoking and currently smoke or have quit within the past 15 years.  Fecal occult blood test (FOBT) of the stool. You may have this test every year starting at age 20.  Flexible sigmoidoscopy or colonoscopy. You may have a sigmoidoscopy every 5 years or a colonoscopy every 10 years starting at age 57.  Hepatitis C blood test.  Hepatitis B blood test.  Sexually transmitted disease (STD) testing.  Diabetes screening. This is done by checking your blood sugar (glucose) after you have not eaten for a while (fasting). You may have this done every 1-3 years.  Mammogram. This may be done every 1-2 years. Talk to your health care provider about when you should start having regular mammograms. This may depend on whether you have a family history of breast cancer.  BRCA-related cancer  screening. This may be done if you have a family history of breast, ovarian, tubal, or peritoneal cancers.  Pelvic exam and Pap test. This may be done every 3 years starting at age 84. Starting at age 61, this may be done every 5 years if you have a Pap  test in combination with an HPV test.  Bone density scan. This is done to screen for osteoporosis. You may have this scan if you are at high risk for osteoporosis. Discuss your test results, treatment options, and if necessary, the need for more tests with your health care provider. Vaccines  Your health care provider may recommend certain vaccines, such as:  Influenza vaccine. This is recommended every year.  Tetanus, diphtheria, and acellular pertussis (Tdap, Td) vaccine. You may need a Td booster every 10 years.  Zoster vaccine. You may need this after age 23.  Pneumococcal 13-valent conjugate (PCV13) vaccine. You may need this if you have certain conditions and were not previously vaccinated.  Pneumococcal polysaccharide (PPSV23) vaccine. You may need one or two doses if you smoke cigarettes or if you have certain conditions. Talk to your health care provider about which screenings and vaccines you need and how often you need them. This information is not intended to replace advice given to you by your health care provider. Make sure you discuss any questions you have with your health care provider. Document Released: 07/08/2015 Document Revised: 02/29/2016 Document Reviewed: 04/12/2015 Elsevier Interactive Patient Education  2017 [place] Prevention in the Home Falls can cause injuries. They can happen to people of all ages. There are many things you can do to make your home safe and to help prevent falls. What can I do on the outside of my home?  Regularly fix the edges of walkways and driveways and fix any cracks.  Remove anything that might make you trip as you walk through a door, such as a raised step or threshold.  Trim any bushes or trees on the path to your home.  Use bright outdoor lighting.  Clear any walking paths of anything that might make someone trip, such as rocks or tools.  Regularly check to see if handrails are loose or broken. Make sure that  both sides of any steps have handrails.  Any raised decks and porches should have guardrails on the edges.  Have any leaves, snow, or ice cleared regularly.  Use sand or salt on walking paths during winter.  Clean up any spills in your garage right away. This includes oil or grease spills. What can I do in the bathroom?  Use night lights.  Install grab bars by the toilet and in the tub and shower. Do not use towel bars as grab bars.  Use non-skid mats or decals in the tub or shower.  If you need to sit down in the shower, use a plastic, non-slip stool.  Keep the floor dry. Clean up any water that spills on the floor as soon as it happens.  Remove soap buildup in the tub or shower regularly.  Attach bath mats securely with double-sided non-slip rug tape.  Do not have throw rugs and other things on the floor that can make you trip. What can I do in the bedroom?  Use night lights.  Make sure that you have a light by your bed that is easy to reach.  Do not use any sheets or blankets that are too big for your bed. They should not  hang down onto the floor.  Have a firm chair that has side arms. You can use this for support while you get dressed.  Do not have throw rugs and other things on the floor that can make you trip. What can I do in the kitchen?  Clean up any spills right away.  Avoid walking on wet floors.  Keep items that you use a lot in easy-to-reach places.  If you need to reach something above you, use a strong step stool that has a grab bar.  Keep electrical cords out of the way.  Do not use floor polish or wax that makes floors slippery. If you must use wax, use non-skid floor wax.  Do not have throw rugs and other things on the floor that can make you trip. What can I do with my stairs?  Do not leave any items on the stairs.  Make sure that there are handrails on both sides of the stairs and use them. Fix handrails that are broken or loose. Make sure  that handrails are as long as the stairways.  Check any carpeting to make sure that it is firmly attached to the stairs. Fix any carpet that is loose or worn.  Avoid having throw rugs at the top or bottom of the stairs. If you do have throw rugs, attach them to the floor with carpet tape.  Make sure that you have a light switch at the top of the stairs and the bottom of the stairs. If you do not have them, ask someone to add them for you. What else can I do to help prevent falls?  Wear shoes that:  Do not have high heels.  Have rubber bottoms.  Are comfortable and fit you well.  Are closed at the toe. Do not wear sandals.  If you use a stepladder:  Make sure that it is fully opened. Do not climb a closed stepladder.  Make sure that both sides of the stepladder are locked into place.  Ask someone to hold it for you, if possible.  Clearly mark and make sure that you can see:  Any grab bars or handrails.  First and last steps.  Where the edge of each step is.  Use tools that help you move around (mobility aids) if they are needed. These include:  Canes.  Walkers.  Scooters.  Crutches.  Turn on the lights when you go into a dark area. Replace any light bulbs as soon as they burn out.  Set up your furniture so you have a clear path. Avoid moving your furniture around.  If any of your floors are uneven, fix them.  If there are any pets around you, be aware of where they are.  Review your medicines with your doctor. Some medicines can make you feel dizzy. This can increase your chance of falling. Ask your doctor what other things that you can do to help prevent falls. This information is not intended to replace advice given to you by your health care provider. Make sure you discuss any questions you have with your health care provider. Document Released: 04/07/2009 Document Revised: 11/17/2015 Document Reviewed: 07/16/2014 Elsevier Interactive Patient Education   2017 [employer].
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
## 2019-06-09 ENCOUNTER — Telehealth: Payer: Self-pay | Admitting: *Deleted

## 2019-06-09 ENCOUNTER — Encounter: Payer: Self-pay | Admitting: Family Medicine

## 2019-06-09 NOTE — Telephone Encounter (Signed)
Pt sent picture through [employer]

## 2019-06-09 NOTE — Telephone Encounter (Signed)
Pt called said her top lip is swelling no shortness of breath. It started yesterday. She wanted to know what Dr Moshe Cipro recommended for her to do.

## 2019-06-09 NOTE — Telephone Encounter (Signed)
No itching or any other symptoms, started yesterday, just top lip swollen but not getting any worse. Will try to upload a picture to send to the dr to see what she recommends

## 2019-06-10 NOTE — Telephone Encounter (Signed)
Called patient and left message for them to return call at the office   

## 2019-06-10 NOTE — Telephone Encounter (Signed)
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

## 2019-06-10 NOTE — Telephone Encounter (Signed)
Patient aware.

## 2019-07-09 ENCOUNTER — Ambulatory Visit: Payer: Medicare Other | Admitting: Family Medicine

## 2019-07-11 ENCOUNTER — Other Ambulatory Visit: Payer: Self-pay | Admitting: Family Medicine

## 2019-07-13 ENCOUNTER — Other Ambulatory Visit: Payer: Self-pay | Admitting: Family Medicine

## 2019-07-15 ENCOUNTER — Other Ambulatory Visit: Payer: Self-pay | Admitting: Family Medicine

## 2019-07-15 ENCOUNTER — Other Ambulatory Visit: Payer: Self-pay

## 2019-07-15 ENCOUNTER — Encounter: Payer: Self-pay | Admitting: Family Medicine

## 2019-07-15 ENCOUNTER — Ambulatory Visit (INDEPENDENT_AMBULATORY_CARE_PROVIDER_SITE_OTHER): Payer: BC Managed Care – PPO | Admitting: Family Medicine

## 2019-07-15 VITALS — BP 119/82 | Ht 66.0 in | Wt 225.0 lb

## 2019-07-15 DIAGNOSIS — I10 Essential (primary) hypertension: Secondary | ICD-10-CM | POA: Diagnosis not present

## 2019-07-15 DIAGNOSIS — E1159 Type 2 diabetes mellitus with other circulatory complications: Secondary | ICD-10-CM | POA: Diagnosis not present

## 2019-07-15 DIAGNOSIS — D539 Nutritional anemia, unspecified: Secondary | ICD-10-CM

## 2019-07-15 DIAGNOSIS — N760 Acute vaginitis: Secondary | ICD-10-CM

## 2019-07-15 DIAGNOSIS — E559 Vitamin D deficiency, unspecified: Secondary | ICD-10-CM

## 2019-07-15 DIAGNOSIS — E1169 Type 2 diabetes mellitus with other specified complication: Secondary | ICD-10-CM

## 2019-07-15 DIAGNOSIS — E785 Hyperlipidemia, unspecified: Secondary | ICD-10-CM

## 2019-07-15 MED ORDER — CLOTRIMAZOLE 2 % VA CREA: 1.0000 | TOPICAL_CREAM | Freq: Every day | VAGINAL | 0 refills | Status: DC

## 2019-07-15 NOTE — Progress Notes (Signed)
Virtual Visit via Telephone Note  I connected with Brittany Nunez on 07/15/19 at  1:40 PM EST by telephone and verified that I am speaking with the correct person using two identifiers.  Location: Patient: home Provider: office  I discussed the limitations, risks, security and privacy concerns of performing an evaluation and management service by telephone and the availability of in person appointments. I also discussed with the patient that there may be a patient responsible charge related to this service. The patient expressed understanding and agreed to proceed.   History of Present Illness: 1 week h/o burning in upper vaginal canal no discharge, denies dysuria , frequency, flank pain, nausea  Or chills Denies polyuria, polydipsia, blurred vision , or hypoglycemic episodes. Otherwise doing well  Observations/Objective: BP 119/82   Ht 5\' 6"  (1.676 m)   Wt 225 lb (102.1 kg)   BMI 36.32 kg/m  Good communication with no confusion and intact memory. Alert and oriented x 3 No signs of respiratory distress during speech    Assessment and Plan: Vulvovaginitis Current flare ,topical medication prescribed  Essential hypertension Controlled, no change in medication   Type 2 diabetes mellitus with vascular disease (HCC) Brittany Nunez is reminded of the importance of commitment to daily physical activity for 30 minutes or more, as able and the need to limit carbohydrate intake to 30 to 60 grams per meal to help with blood sugar control.   The need to take medication as prescribed, test blood sugar as directed, and to call between visits if there is a concern that blood sugar is uncontrolled is also discussed.   Brittany Nunez is reminded of the importance of daily foot exam, annual eye examination, and good blood sugar, blood pressure and cholesterol control. Updated lab needed at/ before next visit.  Diabetic Labs Latest Ref Rng & Units 03/09/2019 09/12/2018 07/03/2018 03/31/2018 08/09/2017  HbA1c  <5.7 % of total Hgb 7.4(H) 6.4(H) - 6.8(H) 6.4(H)  Microalbumin Not Estab. ug/mL - - 6.2(H) - -  Micro/Creat Ratio <30 mcg/mg creat - - - - -  Chol <200 mg/dL 08/11/2017 756 - 433 295  HDL > OR = 50 mg/dL 188) 41(Y) - 60(Y) 30(Z)  Calc LDL mg/dL (calc) 59 58 - 60(F) 98  Triglycerides <150 mg/dL 64 80 - 65 70  Creatinine 0.50 - 1.05 mg/dL 093(A) 3.55(D) - 3.22(G 0.93   BP/Weight 07/15/2019 04/27/2019 03/09/2019 07/03/2018 04/09/2018 03/31/2018 09/16/2017  Systolic BP 119 119 110 120 128 130 110  Diastolic BP 82 82 80 80 82 72 78  Wt. (Lbs) 225 215 213 203 202.8 201.12 190  BMI 36.32 34.7 34.38 32.77 32.73 32.46 31.62   Foot/eye exam completion dates Latest Ref Rng & Units 07/11/2018 07/03/2018  Eye Exam No Retinopathy No Retinopathy -  Foot Form Completion - - Done        Morbid obesity (HCC) Obesity linked with hypertension and diabetes  Patient re-educated about  the importance of commitment to a  minimum of 150 minutes of exercise per week as able.  The importance of healthy food choices with portion control discussed, as well as eating regularly and within a 12 hour window most days. The need to choose "clean , green" food 50 to 75% of the time is discussed, as well as to make water the primary drink and set a goal of 64 ounces water daily.    Weight /BMI 07/15/2019 04/27/2019 03/09/2019  WEIGHT 225 lb 215 lb 213 lb  HEIGHT 5\' 6"  5'  6" 5\' 6"   BMI 36.32 kg/m2 34.7 kg/m2 34.38 kg/m2         Follow Up Instructions:    I discussed the assessment and treatment plan with the patient. The patient was provided an opportunity to ask questions and all were answered. The patient agreed with the plan and demonstrated an understanding of the instructions.   The patient was advised to call back or seek an in-person evaluation if the symptoms worsen or if the condition fails to improve as anticipated.  I provided 12  minutes of non-face-to-face time during this encounter.   Tula Nakayama,  MD

## 2019-07-15 NOTE — Patient Instructions (Addendum)
F/U in office in 3 weeks pt to have labs before visit, and she is to have foot examine and gyne exam to send for HPV serotypes  I have prescribed cream to be applies for 3 days only   Pleae get fasting lipid, cmp and eGFr, HBA1C,  microalb TSH, iron and B12 levels 1 week before next appointment  Thanks for choosing [facility] ([place]), we consider it a privelige to serve you.

## 2019-07-16 ENCOUNTER — Other Ambulatory Visit: Payer: Self-pay | Admitting: Family Medicine

## 2019-07-19 ENCOUNTER — Encounter: Payer: Self-pay | Admitting: Family Medicine

## 2019-07-19 NOTE — Assessment & Plan Note (Signed)
Obesity linked with hypertension and diabetes  Patient re-educated about  the importance of commitment to a  minimum of 150 minutes of exercise per week as able.  The importance of healthy food choices with portion control discussed, as well as eating regularly and within a 12 hour window most days. The need to choose "clean , green" food 50 to 75% of the time is discussed, as well as to make water the primary drink and set a goal of 64 ounces water daily.    Weight /BMI 07/15/2019 04/27/2019 03/09/2019  WEIGHT 225 lb 215 lb 213 lb  HEIGHT 5\' 6"  5\' 6"  5\' 6"   BMI 36.32 kg/m2 34.7 kg/m2 34.38 kg/m2

## 2019-07-19 NOTE — Assessment & Plan Note (Signed)
Brittany Nunez is reminded of the importance of commitment to daily physical activity for 30 minutes or more, as able and the need to limit carbohydrate intake to 30 to 60 grams per meal to help with blood sugar control.   The need to take medication as prescribed, test blood sugar as directed, and to call between visits if there is a concern that blood sugar is uncontrolled is also discussed.   Brittany Nunez is reminded of the importance of daily foot exam, annual eye examination, and good blood sugar, blood pressure and cholesterol control. Updated lab needed at/ before next visit.  Diabetic Labs Latest Ref Rng & Units 03/09/2019 09/12/2018 07/03/2018 03/31/2018 08/09/2017  HbA1c <5.7 % of total Hgb 7.4(H) 6.4(H) - 6.8(H) 6.4(H)  Microalbumin Not Estab. ug/mL - - 6.2(H) - -  Micro/Creat Ratio <30 mcg/mg creat - - - - -  Chol <200 mg/dL 932 671 - 245 809  HDL > OR = 50 mg/dL 98(P) 38(S) - 50(N) 39(J)  Calc LDL mg/dL (calc) 59 58 - 673(A) 98  Triglycerides <150 mg/dL 64 80 - 65 70  Creatinine 0.50 - 1.05 mg/dL 1.93(X) 9.02(I) - 0.97 0.93   BP/Weight 07/15/2019 04/27/2019 03/09/2019 07/03/2018 04/09/2018 03/31/2018 09/16/2017  Systolic BP 119 119 110 120 128 130 110  Diastolic BP 82 82 80 80 82 72 78  Wt. (Lbs) 225 215 213 203 202.8 201.12 190  BMI 36.32 34.7 34.38 32.77 32.73 32.46 31.62   Foot/eye exam completion dates Latest Ref Rng & Units 07/11/2018 07/03/2018  Eye Exam No Retinopathy No Retinopathy -  Foot Form Completion - - Done

## 2019-07-19 NOTE — Assessment & Plan Note (Signed)
Current flare ,topical medication prescribed

## 2019-07-19 NOTE — Assessment & Plan Note (Signed)
Controlled, no change in medication  

## 2019-07-20 ENCOUNTER — Other Ambulatory Visit: Payer: Self-pay | Admitting: Family Medicine

## 2019-07-20 ENCOUNTER — Other Ambulatory Visit: Payer: Self-pay

## 2019-07-20 ENCOUNTER — Telehealth: Payer: Self-pay | Admitting: *Deleted

## 2019-07-20 MED ORDER — TEMAZEPAM 30 MG PO CAPS: ORAL_CAPSULE | ORAL | 5 refills | Status: DC

## 2019-07-20 NOTE — Telephone Encounter (Signed)
Thank you for printing and faxing script already prepared

## 2019-07-20 NOTE — Telephone Encounter (Signed)
Pt needs her tamazepam sent to [facility] she is out of medication

## 2019-07-20 NOTE — Telephone Encounter (Signed)
Med faxed to [facility]

## 2019-07-20 NOTE — Telephone Encounter (Signed)
Can you send this to [facility]. She is out

## 2019-07-28 ENCOUNTER — Ambulatory Visit: Payer: BC Managed Care – PPO

## 2019-07-28 LAB — POCT URINALYSIS DIP (CLINITEK)
Bilirubin, UA: NEGATIVE
Blood, UA: NEGATIVE
Glucose, UA: 500 mg/dL — AB
Ketones, POC UA: NEGATIVE mg/dL
Leukocytes, UA: NEGATIVE
Nitrite, UA: NEGATIVE
POC PROTEIN,UA: 30 — AB
Spec Grav, UA: 1.015 (ref 1.010–1.025)
Urobilinogen, UA: 0.2 E.U./dL
pH, UA: 5.5 (ref 5.0–8.0)

## 2019-07-29 ENCOUNTER — Ambulatory Visit (INDEPENDENT_AMBULATORY_CARE_PROVIDER_SITE_OTHER): Payer: BC Managed Care – PPO | Admitting: Family Medicine

## 2019-07-29 ENCOUNTER — Encounter: Payer: Self-pay | Admitting: Family Medicine

## 2019-07-29 ENCOUNTER — Other Ambulatory Visit: Payer: Self-pay

## 2019-07-29 VITALS — BP 119/82 | Ht 66.0 in | Wt 225.0 lb

## 2019-07-29 DIAGNOSIS — I10 Essential (primary) hypertension: Secondary | ICD-10-CM

## 2019-07-29 DIAGNOSIS — F321 Major depressive disorder, single episode, moderate: Secondary | ICD-10-CM

## 2019-07-29 DIAGNOSIS — N3000 Acute cystitis without hematuria: Secondary | ICD-10-CM | POA: Diagnosis not present

## 2019-07-29 DIAGNOSIS — E1159 Type 2 diabetes mellitus with other circulatory complications: Secondary | ICD-10-CM

## 2019-07-29 NOTE — Assessment & Plan Note (Signed)
Controlled, no change in medication  

## 2019-07-29 NOTE — Assessment & Plan Note (Signed)
Uncontrolled , glycosuria and symptomatic Needs hBa1C today and to change lifestyle

## 2019-07-29 NOTE — Assessment & Plan Note (Signed)
Symptomatic, however  No sign of infection, only excessive glycosuria

## 2019-07-29 NOTE — Progress Notes (Signed)
Virtual Visit via Telephone Note  I connected with Brittany Nunez on 07/29/19 at  9:40 AM EST by telephone and verified that I am speaking with the correct person using two identifiers.  Location: Patient: car Provider: office   I discussed the limitations, risks, security and privacy concerns of performing an evaluation and management service by telephone and the availability of in person appointments. I also discussed with the patient that there may be a patient responsible charge related to this service. The patient expressed understanding and agreed to proceed.   History of Present Illness:   1 month h/o polyuria , polydipsia, and blurred vision and fatigue 3 month h/o poor eating because of convenience, denies depression, has not been testing Urine specimen yesterday only shows glucosuria, no infection, Brittany Nunez thought symptoms were from an infection  Denies recent fever or chills. Denies sinus pressure, nasal congestion, ear pain or sore throat. Denies chest congestion, productive cough or wheezing. Denies chest pains, palpitations and leg swelling   Denies joint pain, swelling and limitation in mobility. Denies headaches, seizures, numbness, or tingling. Denies uncontrolled depression, anxiety or insomnia. Denies skin break down or rash.      Observations/Objective: BP 119/82   Ht 5\' 6"  (1.676 m)   Wt 225 lb (102.1 kg)   BMI 36.32 kg/m  Good communication with no confusion and intact memory. Alert and oriented x 3 No signs of respiratory distress during speech    Assessment and Plan: Acute cystitis without hematuria Symptomatic, however  No sign of infection, only excessive glycosuria  Type 2 diabetes mellitus with vascular disease (HCC) Uncontrolled , glycosuria and symptomatic Needs hBa1C today and to change lifestyle   Essential hypertension Controlled, no change in medication   Depression Controlled, no change in medication     Follow Up  Instructions:    I discussed the assessment and treatment plan with the patient. The patient was provided an opportunity to ask questions and all were answered. The patient agreed with the plan and demonstrated an understanding of the instructions.   The patient was advised to call back or seek an in-person evaluation if the symptoms worsen or if the condition fails to improve as anticipated.  I provided 10 minutes of non-face-to-face time during this encounter.   , MD

## 2019-07-29 NOTE — Patient Instructions (Signed)
Please keep appointment next week  Your blood sugar is very uncontrolled, no sign of UTI in specimen  Labs today  Please start testing and recording fasting and bedtime blood sugar   Please change food choice , as we discussed  It is important that you exercise regularly at least 30 minutes 5 times a week. If you develop chest pain, have severe difficulty breathing, or feel very tired, stop exercising immediately and seek medical attention  \  Thanks for choosing [facility], we consider it a privelige to serve you.

## 2019-07-30 DIAGNOSIS — E1169 Type 2 diabetes mellitus with other specified complication: Secondary | ICD-10-CM | POA: Diagnosis not present

## 2019-07-30 DIAGNOSIS — I10 Essential (primary) hypertension: Secondary | ICD-10-CM | POA: Diagnosis not present

## 2019-07-30 DIAGNOSIS — E785 Hyperlipidemia, unspecified: Secondary | ICD-10-CM | POA: Diagnosis not present

## 2019-07-30 DIAGNOSIS — E1159 Type 2 diabetes mellitus with other circulatory complications: Secondary | ICD-10-CM | POA: Diagnosis not present

## 2019-07-30 DIAGNOSIS — D539 Nutritional anemia, unspecified: Secondary | ICD-10-CM | POA: Diagnosis not present

## 2019-07-31 ENCOUNTER — Other Ambulatory Visit: Payer: Self-pay | Admitting: Family Medicine

## 2019-07-31 DIAGNOSIS — E1165 Type 2 diabetes mellitus with hyperglycemia: Secondary | ICD-10-CM

## 2019-07-31 LAB — COMPLETE METABOLIC PANEL WITH GFR
AG Ratio: 1.4 (calc) (ref 1.0–2.5)
ALT: 23 U/L (ref 6–29)
AST: 18 U/L (ref 10–35)
Albumin: 4 g/dL (ref 3.6–5.1)
Alkaline phosphatase (APISO): 100 U/L (ref 37–153)
BUN/Creatinine Ratio: 16 (calc) (ref 6–22)
BUN: 17 mg/dL (ref 7–25)
CO2: 25 mmol/L (ref 20–32)
Calcium: 8.9 mg/dL (ref 8.6–10.4)
Chloride: 103 mmol/L (ref 98–110)
Creat: 1.06 mg/dL — ABNORMAL HIGH (ref 0.50–0.99)
GFR, Est African American: 66 mL/min/{1.73_m2} (ref >=60)
GFR, Est Non African American: 57 mL/min/{1.73_m2} — ABNORMAL LOW (ref >=60)
Globulin: 2.8 g/dL (calc) (ref 1.9–3.7)
Glucose, Bld: 373 mg/dL — ABNORMAL HIGH (ref 65–99)
Potassium: 4.2 mmol/L (ref 3.5–5.3)
Sodium: 138 mmol/L (ref 135–146)
Total Bilirubin: 0.3 mg/dL (ref 0.2–1.2)
Total Protein: 6.8 g/dL (ref 6.1–8.1)

## 2019-07-31 LAB — HEMOGLOBIN A1C
Hgb A1c MFr Bld: 13.7 % of total Hgb — ABNORMAL HIGH (ref <5.7)
Mean Plasma Glucose: 346 (calc)
eAG (mmol/L): 19.2 (calc)

## 2019-07-31 LAB — IRON: Iron: 50 ug/dL (ref 45–160)

## 2019-07-31 LAB — TSH: TSH: 1.03 mIU/L (ref 0.40–4.50)

## 2019-07-31 LAB — MICROALBUMIN / CREATININE URINE RATIO
Creatinine, Urine: 96 mg/dL (ref 20–275)
Microalb Creat Ratio: 11 mcg/mg creat (ref <30)
Microalb, Ur: 1.1 mg/dL

## 2019-07-31 LAB — LIPID PANEL
Cholesterol: 109 mg/dL (ref <200)
HDL: 34 mg/dL — ABNORMAL LOW (ref >=50)
LDL Cholesterol (Calc): 54 mg/dL (calc)
Non-HDL Cholesterol (Calc): 75 mg/dL (calc) (ref <130)
Total CHOL/HDL Ratio: 3.2 (calc) (ref <5.0)
Triglycerides: 130 mg/dL (ref <150)

## 2019-07-31 LAB — VITAMIN B12: Vitamin B-12: 260 pg/mL (ref 200–1100)

## 2019-07-31 NOTE — Progress Notes (Unsigned)
amb endo  

## 2019-08-02 ENCOUNTER — Other Ambulatory Visit: Payer: Self-pay | Admitting: Family Medicine

## 2019-08-03 ENCOUNTER — Telehealth: Payer: Self-pay | Admitting: *Deleted

## 2019-08-03 NOTE — Telephone Encounter (Signed)
-----   Message from Kerri Perches, MD sent at 07/31/2019  9:15 AM EST ----- Please let pt know that her blood sugar is extremely UNCONTROLLED,  HBA1C is 13.7, twice the goal I am referring her to Dr Fransico Him to manage the diabetes, needs to change her diet and  start testing and recording fasting and bedtime. ( urgent referral and note sent) Remind her to keep appt next week with [place] also please

## 2019-08-03 NOTE — Telephone Encounter (Signed)
Spoke with pt let her know dr simpson recommendations and that dr nidas office should be reaching out. Let her know to keep fasting and bedtime blood sugars. Also to keep appt with Dahlia Client later this week. She verbalized understanding.

## 2019-08-05 ENCOUNTER — Ambulatory Visit: Payer: BC Managed Care – PPO | Admitting: Family Medicine

## 2019-08-05 ENCOUNTER — Other Ambulatory Visit: Payer: Self-pay

## 2019-08-05 ENCOUNTER — Ambulatory Visit (INDEPENDENT_AMBULATORY_CARE_PROVIDER_SITE_OTHER): Payer: BC Managed Care – PPO | Admitting: Family Medicine

## 2019-08-05 ENCOUNTER — Encounter: Payer: Self-pay | Admitting: Family Medicine

## 2019-08-05 VITALS — BP 132/76 | HR 103 | Temp 96.5°F | Resp 15 | Ht 66.0 in | Wt 208.1 lb

## 2019-08-05 DIAGNOSIS — E559 Vitamin D deficiency, unspecified: Secondary | ICD-10-CM

## 2019-08-05 DIAGNOSIS — D649 Anemia, unspecified: Secondary | ICD-10-CM

## 2019-08-05 DIAGNOSIS — E1159 Type 2 diabetes mellitus with other circulatory complications: Secondary | ICD-10-CM

## 2019-08-05 MED ORDER — GLUCOSE BLOOD VI STRP: ORAL_STRIP | 2 refills | Status: DC

## 2019-08-05 NOTE — Patient Instructions (Addendum)
Happy New Year! May you have a year filled with hope, love, happiness and laughter.  I appreciate the opportunity to provide you with care for your health and wellness. Today we discussed: overall health   Follow up: 4 weeks can be by phone   Labs- today or tomorrow Follow up with Dr Fransico Him for diabetes  GOALS:  1: Improve fasting blood sugars, to low 100 range 2: Walking more 3: Make sure eating well balanced for diabetes  Please continue to practice social distancing to keep you, your family, and our community safe.  If you must go out, please wear a mask and practice good handwashing.  It was a pleasure to see you and I look forward to continuing to work together on your health and well-being. Please do not hesitate to call the office if you need care or have questions about your care.  Have a wonderful day and week. With Gratitude, Tereasa Coop, DNP, AGNP-BC
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
## 2019-08-06 ENCOUNTER — Other Ambulatory Visit: Payer: Self-pay

## 2019-08-06 DIAGNOSIS — E1159 Type 2 diabetes mellitus with other circulatory complications: Secondary | ICD-10-CM

## 2019-08-06 MED ORDER — GLUCOSE BLOOD VI STRP: ORAL_STRIP | 2 refills | Status: DC

## 2019-08-08 ENCOUNTER — Encounter: Payer: Self-pay | Admitting: Family Medicine

## 2019-08-08 NOTE — Assessment & Plan Note (Signed)
Needs updated labs.

## 2019-08-08 NOTE — Assessment & Plan Note (Signed)
She reports improved fasting numbers over the last week.  Needs strong lifestyle changes. Brittany Nunez is encouraged to check blood sugar daily as directed. Continue current medications. Is on statin as well. Educated on importance of maintain a well balanced diabetic friendly diet.  She is reminded the importance of maintaining  good blood sugars,  taking medications as directed, daily foot care, annual eye exams. Additionally educated about keeping good control over blood pressure and cholesterol as well.

## 2019-08-08 NOTE — Assessment & Plan Note (Signed)
Getting up dated labs to check for anemia as she reported a desire to eat ice and drink soda.

## 2019-08-10 DIAGNOSIS — D649 Anemia, unspecified: Secondary | ICD-10-CM | POA: Diagnosis not present

## 2019-08-10 DIAGNOSIS — E559 Vitamin D deficiency, unspecified: Secondary | ICD-10-CM | POA: Diagnosis not present

## 2019-08-11 ENCOUNTER — Other Ambulatory Visit: Payer: Self-pay | Admitting: Family Medicine

## 2019-08-11 DIAGNOSIS — E559 Vitamin D deficiency, unspecified: Secondary | ICD-10-CM

## 2019-08-11 LAB — CBC WITH DIFFERENTIAL/PLATELET
Absolute Monocytes: 480 cells/uL (ref 200–950)
Basophils Absolute: 30 cells/uL (ref 0–200)
Basophils Relative: 0.4 %
Eosinophils Absolute: 90 cells/uL (ref 15–500)
Eosinophils Relative: 1.2 %
HCT: 35.4 % (ref 35.0–45.0)
Hemoglobin: 11.6 g/dL — ABNORMAL LOW (ref 11.7–15.5)
Lymphs Abs: 2393 cells/uL (ref 850–3900)
MCH: 26 pg — ABNORMAL LOW (ref 27.0–33.0)
MCHC: 32.8 g/dL (ref 32.0–36.0)
MCV: 79.2 fL — ABNORMAL LOW (ref 80.0–100.0)
MPV: 9.8 fL (ref 7.5–12.5)
Monocytes Relative: 6.4 %
Neutro Abs: 4508 cells/uL (ref 1500–7800)
Neutrophils Relative %: 60.1 %
Platelets: 344 10*3/uL (ref 140–400)
RBC: 4.47 10*6/uL (ref 3.80–5.10)
RDW: 14.6 % (ref 11.0–15.0)
Total Lymphocyte: 31.9 %
WBC: 7.5 10*3/uL (ref 3.8–10.8)

## 2019-08-11 LAB — FOLATE: Folate: 8 ng/mL

## 2019-08-11 LAB — VITAMIN B12: Vitamin B-12: 241 pg/mL (ref 200–1100)

## 2019-08-11 LAB — VITAMIN D 25 HYDROXY (VIT D DEFICIENCY, FRACTURES): Vit D, 25-Hydroxy: 14 ng/mL — ABNORMAL LOW (ref 30–100)

## 2019-08-11 MED ORDER — VITAMIN D (ERGOCALCIFEROL) 1.25 MG (50000 UNIT) PO CAPS: 50000.0000 [IU] | ORAL_CAPSULE | ORAL | 1 refills | Status: DC

## 2019-08-25 ENCOUNTER — Ambulatory Visit: Payer: BC Managed Care – PPO | Attending: Internal Medicine

## 2019-08-25 DIAGNOSIS — Z23 Encounter for immunization: Secondary | ICD-10-CM

## 2019-08-25 NOTE — Progress Notes (Signed)
   Covid-19 Vaccination Clinic  Name:  Brittany Nunez    MRN: [id number] DOB: Apr 27, 1961  08/25/2019  [name] was observed post Covid-19 immunization for 15 minutes without incident. She was provided with Vaccine Information Sheet and instruction to access the V-Safe system.   [name] was instructed to call 911 with any severe reactions post vaccine: Marland Kitchen Difficulty breathing  . Swelling of face and throat  . A fast heartbeat  . A bad rash all over body  . Dizziness and weakness   Immunizations Administered    Name Date Dose VIS Date Route   Moderna COVID-19 Vaccine 08/25/2019  1:06 PM 0.5 mL 05/26/2019 Intramuscular   Manufacturer: Moderna   Lot: [id number]   NDC: 11572-620-35

## 2019-08-31 ENCOUNTER — Other Ambulatory Visit: Payer: Self-pay | Admitting: Family Medicine

## 2019-09-02 ENCOUNTER — Other Ambulatory Visit: Payer: Self-pay

## 2019-09-02 ENCOUNTER — Ambulatory Visit: Payer: BC Managed Care – PPO | Admitting: Family Medicine

## 2019-09-07 ENCOUNTER — Other Ambulatory Visit: Payer: Self-pay | Admitting: Family Medicine

## 2019-09-10 ENCOUNTER — Other Ambulatory Visit: Payer: Self-pay

## 2019-09-10 ENCOUNTER — Ambulatory Visit (INDEPENDENT_AMBULATORY_CARE_PROVIDER_SITE_OTHER): Payer: BC Managed Care – PPO | Admitting: Family Medicine

## 2019-09-10 ENCOUNTER — Encounter: Payer: Self-pay | Admitting: Family Medicine

## 2019-09-10 VITALS — BP 132/76 | Ht 66.0 in | Wt 200.0 lb

## 2019-09-10 DIAGNOSIS — E785 Hyperlipidemia, unspecified: Secondary | ICD-10-CM | POA: Diagnosis not present

## 2019-09-10 DIAGNOSIS — E1165 Type 2 diabetes mellitus with hyperglycemia: Secondary | ICD-10-CM | POA: Diagnosis not present

## 2019-09-10 DIAGNOSIS — E1169 Type 2 diabetes mellitus with other specified complication: Secondary | ICD-10-CM

## 2019-09-10 NOTE — Assessment & Plan Note (Signed)
Obesity is linked to hypertension, diabetes, hyperlipidemia.  Improved  Brittany Nunez is educated about the importance of exercise daily to help with weight management. A minumum of 30 minutes daily is recommended. Additionally, importance of healthy food choices  with portion control discussed.   Wt Readings from Last 3 Encounters:  09/10/19 200 lb (90.7 kg)  08/05/19 208 lb 1.3 oz (94.4 kg)  07/29/19 225 lb (102.1 kg)
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
## 2019-09-10 NOTE — Assessment & Plan Note (Signed)
Advised to continue to eat a low-fat diet.  And to continue statin medication.  Recent labs demonstrated control no change

## 2019-09-10 NOTE — Assessment & Plan Note (Signed)
Will be getting rechecks ordered for labs 1 week prior to next appointment in 3 months.  Blood sugar levels fasting have come down into the low 100 range and she has been holding there.  She can identify when she is ate wrong and is excited to see what her numbers will be in the next couple months.  Brittany Nunez is encouraged to check blood sugar daily as directed. Continue current medications. Is on statin as well She is reminded the importance of maintaining  good blood sugars,  taking medications as directed, daily foot care, annual eye exams. Additionally educated about keeping good control over blood pressure and cholesterol as well.

## 2019-09-10 NOTE — Patient Instructions (Signed)
I appreciate the opportunity to provide you with care for your health and wellness. Today we discussed: diabetes  Follow up: 3 months in office   Labs placed today, please get 1 week prior to next appt  Please continue to practice social distancing to keep you, your family, and our community safe.  If you must go out, please wear a mask and practice good handwashing.  It was a pleasure to see you and I look forward to continuing to work together on your health and well-being. Please do not hesitate to call the office if you need care or have questions about your care.  Have a wonderful day and week. With Gratitude, Tereasa Coop, DNP, AGNP-BC

## 2019-09-21 IMAGING — MG DIGITAL SCREENING BILATERAL MAMMOGRAM WITH TOMO AND CAD
8 series · 8 of 24 positions shown · non-contrast
Comparison: Previous exam(s).

CLINICAL DATA: Screening.

EXAM:
DIGITAL SCREENING BILATERAL MAMMOGRAM WITH TOMO AND CAD

[R MLO synth-2D]
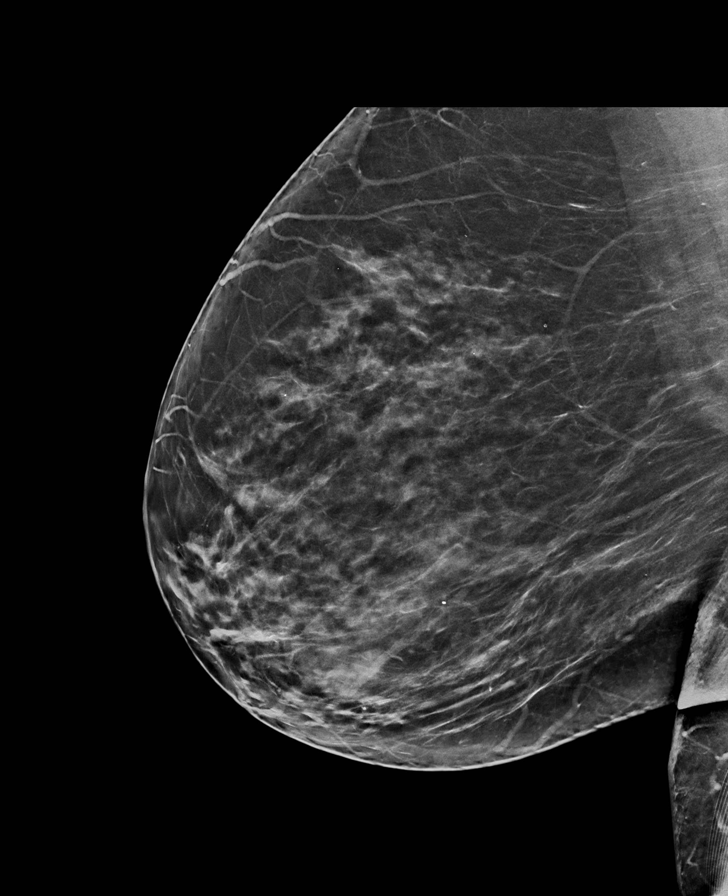

[R CC synth-2D]
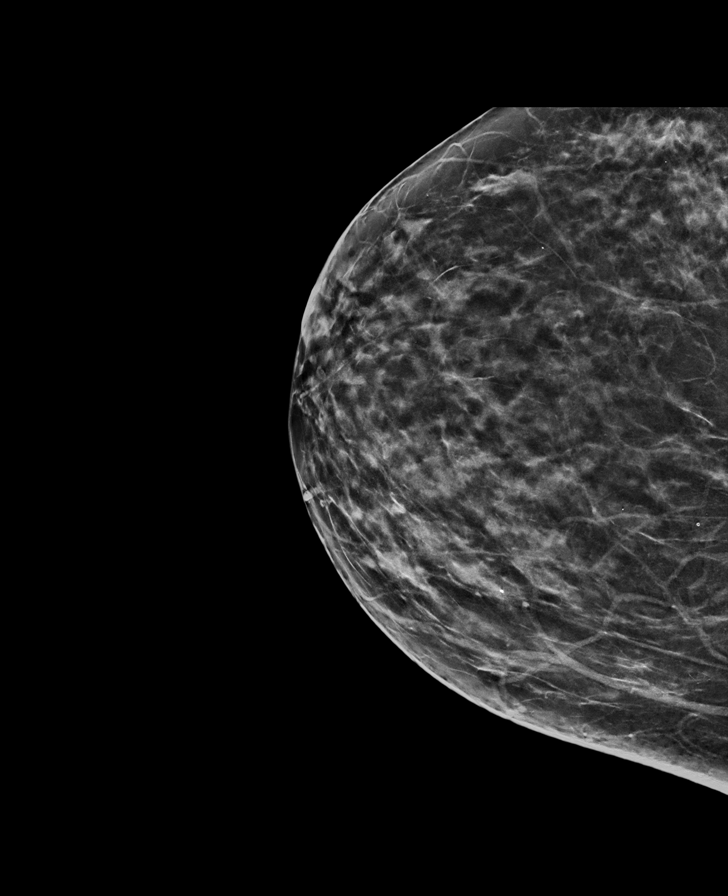

[L MLO synth-2D]
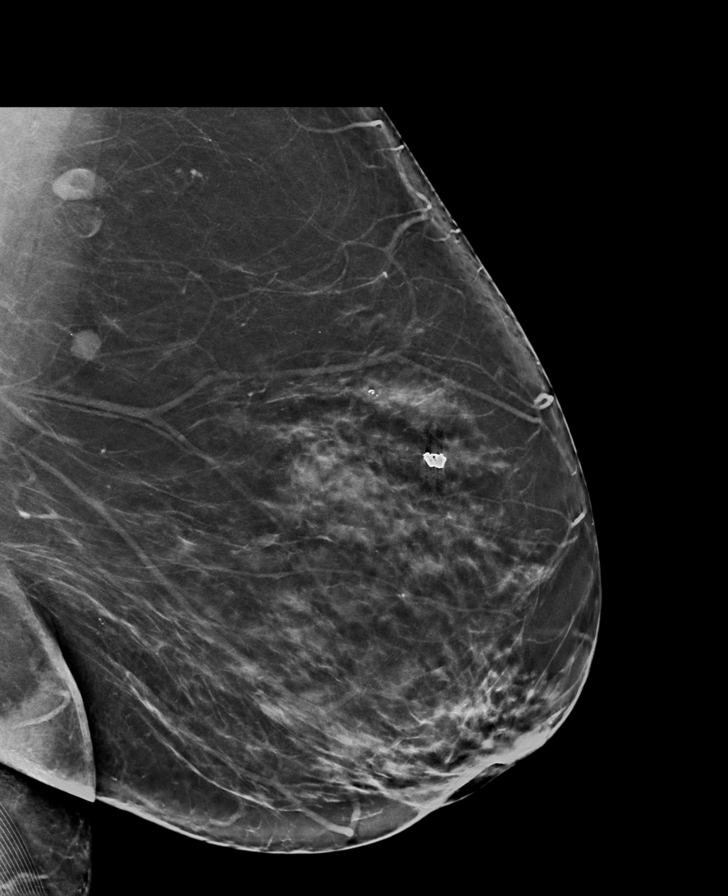

[L CC synth-2D]
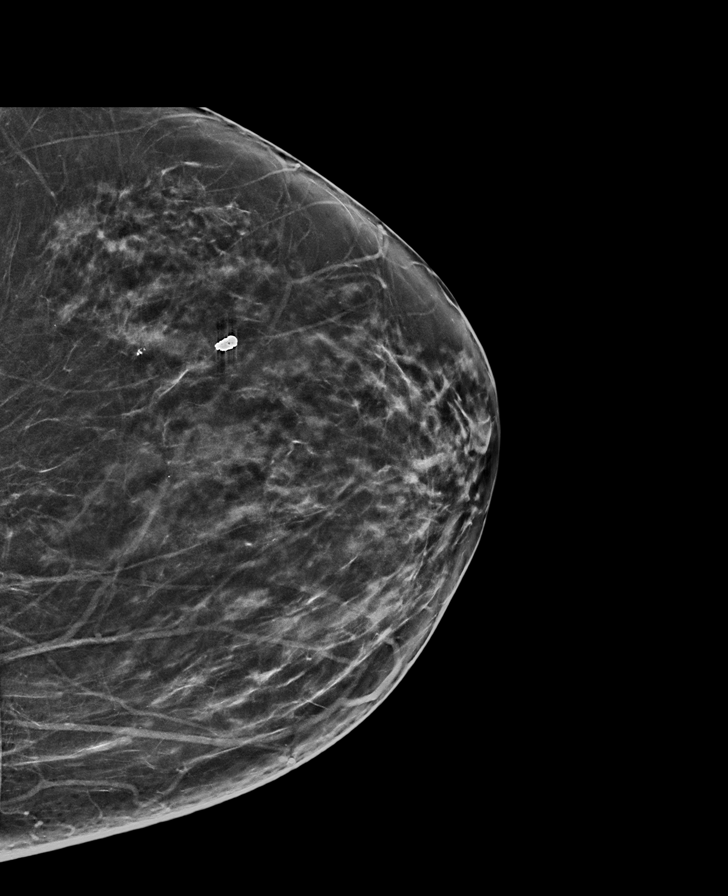

[R CC tomo · tomo slice 34/67.0]
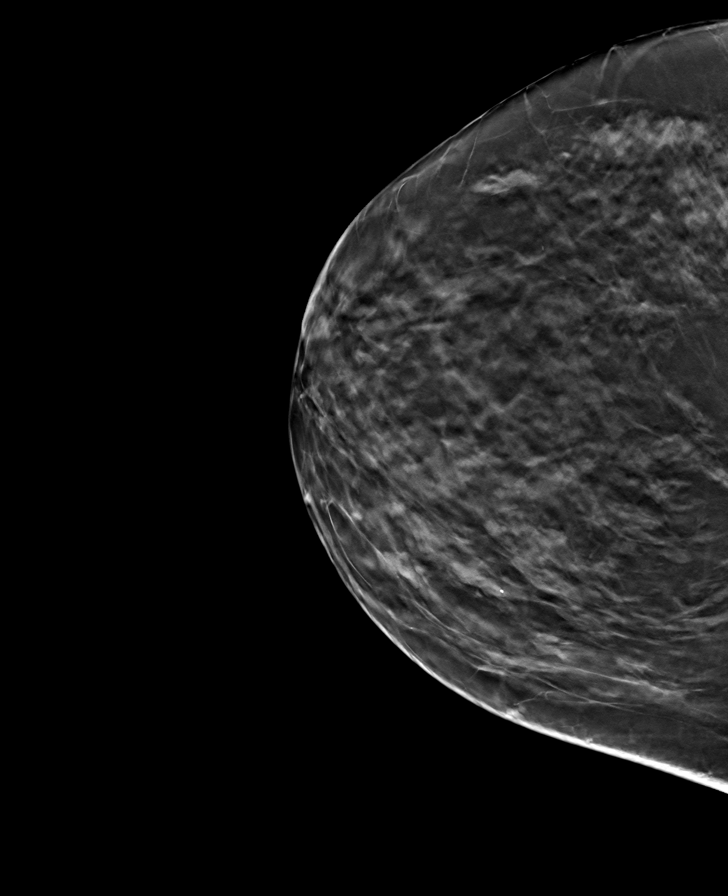

[L CC tomo · tomo slice 35/68.0]
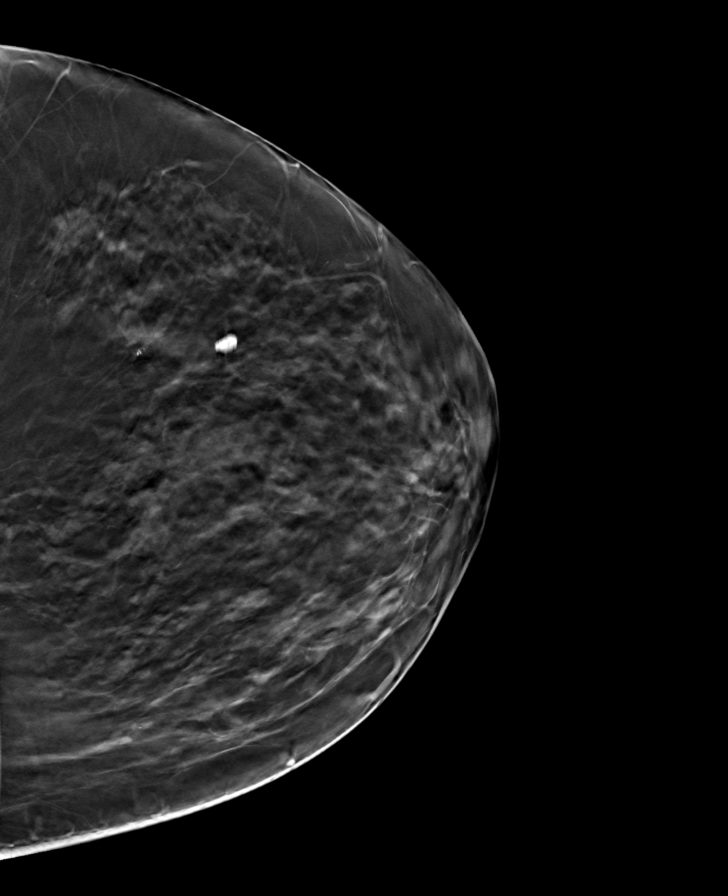

[R MLO tomo · tomo slice 39/76.0]
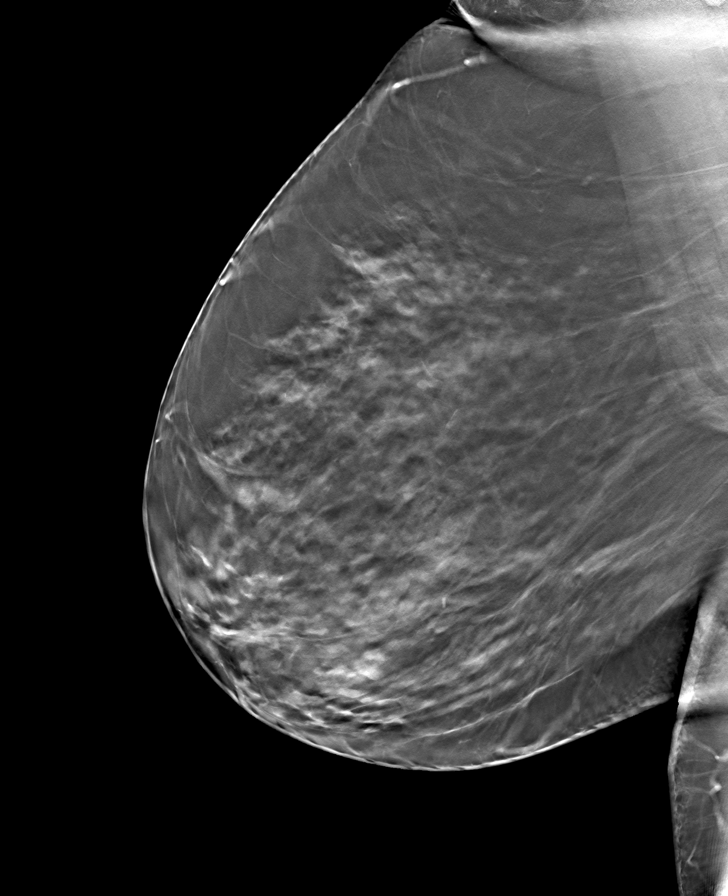

[L MLO tomo · tomo slice 42/83.0]
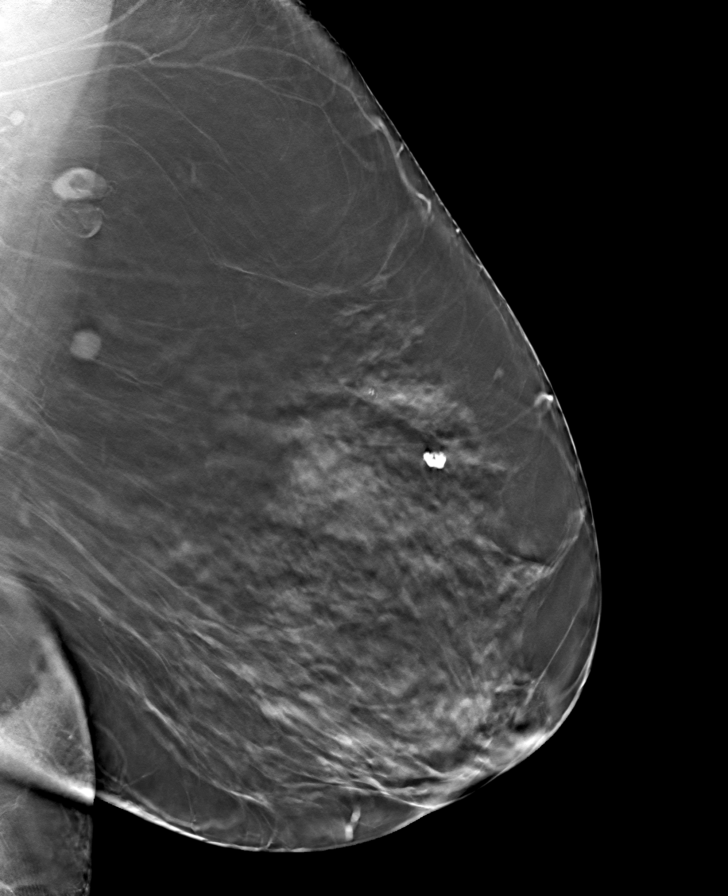

[8 of 24 positions shown; findings below may reference images not displayed]

ACR Breast Density Category c: The breast tissue is heterogeneously
dense, which may obscure small masses.
FINDINGS: There are no findings suspicious for malignancy. Images were
processed with CAD.
IMPRESSION: No mammographic evidence of malignancy. A result letter of this
screening mammogram will be mailed directly to the patient.

RECOMMENDATION:
Screening mammogram in one year. (Code:[id number])

BI-RADS CATEGORY  1: Negative.

## 2019-09-22 ENCOUNTER — Ambulatory Visit: Payer: BC Managed Care – PPO | Attending: Internal Medicine

## 2019-09-22 DIAGNOSIS — Z23 Encounter for immunization: Secondary | ICD-10-CM

## 2019-09-22 NOTE — Progress Notes (Signed)
   Covid-19 Vaccination Clinic  Name:  Brittany Nunez    MRN: [id number] DOB: [birth year]  09/22/2019  Brittany Nunez was observed post Covid-19 immunization for 15 minutes without incident. She was provided with Vaccine Information Sheet and instruction to access the V-Safe system.   Brittany Nunez was instructed to call 911 with any severe reactions post vaccine: Marland Kitchen Difficulty breathing  . Swelling of face and throat  . A fast heartbeat  . A bad rash all over body  . Dizziness and weakness   Immunizations Administered    Name Date Dose VIS Date Route   Moderna COVID-19 Vaccine 09/22/2019 12:06 PM 0.5 mL 05/26/2019 Intramuscular   Manufacturer: Moderna   Lot: [id number]   NDC: 91638-466-59

## 2019-09-28 ENCOUNTER — Other Ambulatory Visit: Payer: Self-pay | Admitting: Family Medicine

## 2019-09-30 ENCOUNTER — Ambulatory Visit (INDEPENDENT_AMBULATORY_CARE_PROVIDER_SITE_OTHER): Payer: BC Managed Care – PPO | Admitting: Family Medicine

## 2019-09-30 ENCOUNTER — Encounter: Payer: Self-pay | Admitting: Family Medicine

## 2019-09-30 ENCOUNTER — Other Ambulatory Visit: Payer: Self-pay

## 2019-09-30 VITALS — BP 111/72 | HR 100 | Temp 97.6°F | Ht 66.0 in | Wt 197.8 lb

## 2019-09-30 DIAGNOSIS — M6740 Ganglion, unspecified site: Secondary | ICD-10-CM | POA: Diagnosis not present

## 2019-09-30 NOTE — Patient Instructions (Addendum)
  COVID-19 Vaccine Information can be found at: [email or web address] For questions related to vaccine distribution or appointments, please email [email or web address] or call [phone].      If you have lab work done today you will be contacted with your lab results within the next 2 weeks.  If you have not heard from [place] then please contact us. The fastest way to get your results is to register for My Chart. Hand surgery referral  IF you received an x-ray today, you will receive an invoice from [facility]. Please contact [facility] at [phone] with questions or concerns regarding your invoice.   IF you received labwork today, you will receive an invoice from [place]. Please contact [facility] at [phone] with questions or concerns regarding your invoice.   Our billing staff will not be able to assist you with questions regarding bills from these companies.  You will be contacted with the lab results as soon as they are available. The fastest way to get your results is to activate your My Chart account. Instructions are located on the last page of this paperwork. If you have not heard from [place] regarding the results in 2 weeks, please contact this office.
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
## 2019-10-07 ENCOUNTER — Telehealth: Payer: Self-pay | Admitting: Family Medicine

## 2019-10-07 NOTE — Telephone Encounter (Signed)
Please send back a message for f/u on this. Hba1C in Feb is over 13!! Referral entered to Dr Fransico Him, no appt on schedule. Pls verify with pt she will go, NEEDS to! And get appt date in that office if possible please, thanks!

## 2019-10-08 NOTE — Telephone Encounter (Signed)
Called patient and left voicemail to call back, Nida was unable to get her on the phone to schedule.Left message to call me back

## 2019-10-08 NOTE — Telephone Encounter (Signed)
Patient called back and was aware that Nidas office has been unable to reach her and gave her the number to call and speak with them so she can get scheduled

## 2019-10-12 ENCOUNTER — Other Ambulatory Visit: Payer: Self-pay | Admitting: Family Medicine

## 2019-10-20 ENCOUNTER — Encounter: Payer: Self-pay | Admitting: Family Medicine

## 2019-10-21 ENCOUNTER — Other Ambulatory Visit: Payer: Self-pay | Admitting: *Deleted

## 2019-10-21 MED ORDER — ACCU-CHEK SOFTCLIX LANCETS MISC: 12 refills | Status: DC

## 2019-10-22 ENCOUNTER — Other Ambulatory Visit: Payer: Self-pay | Admitting: Family Medicine

## 2019-11-09 ENCOUNTER — Ambulatory Visit (INDEPENDENT_AMBULATORY_CARE_PROVIDER_SITE_OTHER): Payer: BC Managed Care – PPO | Admitting: "Endocrinology

## 2019-11-09 ENCOUNTER — Other Ambulatory Visit: Payer: Self-pay

## 2019-11-09 ENCOUNTER — Encounter: Payer: Self-pay | Admitting: "Endocrinology

## 2019-11-09 VITALS — BP 118/77 | HR 76 | Ht 66.0 in | Wt 192.2 lb

## 2019-11-09 DIAGNOSIS — E559 Vitamin D deficiency, unspecified: Secondary | ICD-10-CM | POA: Diagnosis not present

## 2019-11-09 DIAGNOSIS — E782 Mixed hyperlipidemia: Secondary | ICD-10-CM | POA: Diagnosis not present

## 2019-11-09 DIAGNOSIS — E1165 Type 2 diabetes mellitus with hyperglycemia: Secondary | ICD-10-CM | POA: Diagnosis not present

## 2019-11-09 LAB — POCT GLYCOSYLATED HEMOGLOBIN (HGB A1C): Hemoglobin A1C: 6.6 % — AB (ref 4.0–5.6)

## 2019-11-09 MED ORDER — SITAGLIP PHOS-METFORMIN HCL ER 50-1000 MG PO TB24: 1.0000 | ORAL_TABLET | Freq: Every day | ORAL | 1 refills | Status: DC

## 2019-11-09 NOTE — Patient Instructions (Signed)
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
## 2019-11-19 ENCOUNTER — Other Ambulatory Visit: Payer: Self-pay | Admitting: Family Medicine

## 2019-11-19 NOTE — Telephone Encounter (Signed)
Done

## 2019-11-21 ENCOUNTER — Other Ambulatory Visit: Payer: Self-pay | Admitting: Family Medicine

## 2019-11-23 ENCOUNTER — Other Ambulatory Visit: Payer: Self-pay | Admitting: Family Medicine

## 2019-11-24 ENCOUNTER — Telehealth: Payer: Self-pay

## 2019-11-24 ENCOUNTER — Telehealth: Payer: Self-pay | Admitting: "Endocrinology

## 2019-11-24 MED ORDER — SITAGLIP PHOS-METFORMIN HCL ER 50-1000 MG PO TB24: 1.0000 | ORAL_TABLET | Freq: Every day | ORAL | 0 refills | Status: DC

## 2019-11-24 NOTE — Telephone Encounter (Signed)
Patient advised to call pharmacy and ask if they had a prescription on file for her Janumet because it was refilled on 11/09/19 by Dr.Nida. Told her if they dont, she can call his office and let them know she never got the prescription. Omeprazole already refilled today.

## 2019-11-24 NOTE — Telephone Encounter (Signed)
Calling to request rx refill for Janumet and Ometravole.  This is the spelling the patient gave me.  [facility] to [facility] - [facility] 

## 2019-11-24 NOTE — Telephone Encounter (Signed)
Calling to request rx refill for Janumet and Ometravole.  This is the spelling the patient gave me.  [facility] to [facility] - [facility]

## 2019-11-24 NOTE — Telephone Encounter (Signed)
Rx refill sent.

## 2019-11-24 NOTE — Telephone Encounter (Signed)
Patient requesting her refill on Janumet 50/1000 mg XR p.o. daily .

## 2019-11-26 ENCOUNTER — Other Ambulatory Visit: Payer: Self-pay

## 2019-11-26 DIAGNOSIS — E1165 Type 2 diabetes mellitus with hyperglycemia: Secondary | ICD-10-CM

## 2019-11-26 MED ORDER — SITAGLIP PHOS-METFORMIN HCL ER 50-1000 MG PO TB24: 1.0000 | ORAL_TABLET | Freq: Every day | ORAL | 0 refills | Status: DC

## 2019-11-26 NOTE — Telephone Encounter (Signed)
Can you re send this? SitaGLIPtin-MetFORMIN HCl 50-1000 MG TB24. It says fill later. [facility] [place]

## 2019-12-23 ENCOUNTER — Telehealth (INDEPENDENT_AMBULATORY_CARE_PROVIDER_SITE_OTHER): Payer: BC Managed Care – PPO | Admitting: Family Medicine

## 2019-12-23 ENCOUNTER — Other Ambulatory Visit: Payer: Self-pay

## 2019-12-23 VITALS — BP 118/71 | Ht 66.0 in | Wt 192.0 lb

## 2019-12-23 DIAGNOSIS — N3001 Acute cystitis with hematuria: Secondary | ICD-10-CM | POA: Diagnosis not present

## 2019-12-23 LAB — POCT URINALYSIS DIP (CLINITEK)
Bilirubin, UA: NEGATIVE
Glucose, UA: NEGATIVE mg/dL
Ketones, POC UA: NEGATIVE mg/dL
Nitrite, UA: NEGATIVE
POC PROTEIN,UA: NEGATIVE
Spec Grav, UA: 1.015 (ref 1.010–1.025)
Urobilinogen, UA: 0.2 E.U./dL
pH, UA: 5.5 (ref 5.0–8.0)

## 2019-12-23 MED ORDER — CIPROFLOXACIN HCL 250 MG PO TABS: 250.0000 mg | ORAL_TABLET | Freq: Two times a day (BID) | ORAL | 0 refills | Status: DC

## 2019-12-23 NOTE — Patient Instructions (Signed)
I appreciate the opportunity to provide you with care for your health and wellness. Today we discussed: UTI   Follow up: 4-5 Months in office with Dr Lodema Hong  No labs or referrals today  Please take medication as directed Call if not better. Drink water and cranberry juice or lemon juice.  Please continue to practice social distancing to keep you, your family, and our community safe.  If you must go out, please wear a mask and practice good handwashing.  It was a pleasure to see you and I look forward to continuing to work together on your health and well-being. Please do not hesitate to call the office if you need care or have questions about your care.  Have a wonderful day and week. With Gratitude, Tereasa Coop, DNP, AGNP-BC
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
## 2019-12-23 NOTE — Assessment & Plan Note (Signed)
Symptomatic, positive dip in office.  Send out for culture.  Provided with Cipro allergies to Bactrim.  Given age Macrobid not preferred choice.

## 2019-12-24 ENCOUNTER — Other Ambulatory Visit (HOSPITAL_COMMUNITY)
Admission: RE | Admit: 2019-12-24 | Discharge: 2019-12-24 | Disposition: A | Discharge status: Home / Self Care | Payer: BC Managed Care – PPO | Source: Other Acute Inpatient Hospital | Attending: Family Medicine | Admitting: Family Medicine

## 2019-12-24 DIAGNOSIS — N3001 Acute cystitis with hematuria: Secondary | ICD-10-CM | POA: Insufficient documentation

## 2019-12-26 LAB — URINE CULTURE: Culture: >=100000 — AB

## 2020-01-06 ENCOUNTER — Other Ambulatory Visit: Payer: Self-pay | Admitting: Family Medicine

## 2020-01-14 ENCOUNTER — Other Ambulatory Visit (HOSPITAL_COMMUNITY): Payer: Self-pay | Admitting: Family Medicine

## 2020-01-14 DIAGNOSIS — Z1231 Encounter for screening mammogram for malignant neoplasm of breast: Secondary | ICD-10-CM

## 2020-01-18 ENCOUNTER — Other Ambulatory Visit: Payer: Self-pay | Admitting: Family Medicine

## 2020-01-26 ENCOUNTER — Other Ambulatory Visit: Payer: Self-pay

## 2020-01-26 ENCOUNTER — Telehealth: Payer: Self-pay

## 2020-01-26 MED ORDER — TEMAZEPAM 30 MG PO CAPS: ORAL_CAPSULE | ORAL | 0 refills | Status: DC

## 2020-01-26 NOTE — Telephone Encounter (Signed)
Prescription printed and waiting for signature 

## 2020-01-26 NOTE — Telephone Encounter (Signed)
Please call in Tamazepam to [facility]

## 2020-01-26 NOTE — Telephone Encounter (Signed)
Last appt 6/3 with Brittany Nunez. Next appt in Sept.  Asking for temazepam refill

## 2020-01-27 ENCOUNTER — Other Ambulatory Visit: Payer: Self-pay | Admitting: Family Medicine

## 2020-01-27 MED ORDER — TEMAZEPAM 30 MG PO CAPS
30.0000 mg | ORAL_CAPSULE | Freq: Every evening | ORAL | 3 refills | Status: DC | PRN
Start: 2020-01-27 — End: 2020-06-28

## 2020-01-27 NOTE — Telephone Encounter (Signed)
Med is sent  °

## 2020-02-02 ENCOUNTER — Other Ambulatory Visit (HOSPITAL_COMMUNITY)
Admission: RE | Admit: 2020-02-02 | Discharge: 2020-02-02 | Disposition: A | Discharge status: Home / Self Care | Payer: BC Managed Care – PPO | Source: Ambulatory Visit | Attending: Family Medicine | Admitting: Family Medicine

## 2020-02-02 ENCOUNTER — Encounter: Payer: Self-pay | Admitting: Family Medicine

## 2020-02-02 ENCOUNTER — Ambulatory Visit (INDEPENDENT_AMBULATORY_CARE_PROVIDER_SITE_OTHER): Payer: BC Managed Care – PPO | Admitting: Family Medicine

## 2020-02-02 ENCOUNTER — Other Ambulatory Visit: Payer: Self-pay

## 2020-02-02 VITALS — BP 119/78 | HR 91 | Resp 16 | Ht 66.0 in | Wt 185.0 lb

## 2020-02-02 DIAGNOSIS — E663 Overweight: Secondary | ICD-10-CM | POA: Diagnosis not present

## 2020-02-02 DIAGNOSIS — I10 Essential (primary) hypertension: Secondary | ICD-10-CM | POA: Diagnosis not present

## 2020-02-02 DIAGNOSIS — E1159 Type 2 diabetes mellitus with other circulatory complications: Secondary | ICD-10-CM | POA: Diagnosis not present

## 2020-02-02 DIAGNOSIS — F321 Major depressive disorder, single episode, moderate: Secondary | ICD-10-CM

## 2020-02-02 DIAGNOSIS — N760 Acute vaginitis: Secondary | ICD-10-CM

## 2020-02-02 DIAGNOSIS — Z23 Encounter for immunization: Secondary | ICD-10-CM

## 2020-02-02 MED ORDER — FLUCONAZOLE 100 MG PO TABS
100.0000 mg | ORAL_TABLET | Freq: Every day | ORAL | 0 refills | Status: AC
Start: 2020-02-02 — End: 2020-02-07

## 2020-02-02 NOTE — Patient Instructions (Signed)
Annual physical exam with MD in 3 months call if you need me sooner.Shingrix # 2 at visit Please get fasting lipids CMP and EGFR 1 week prior to visit.  Shingrix No. 1 in office today.  Fluconazole 100 mg once daily for 5 days is prescribed.  Specimens are sent for additional testing.  Congrats on marked improvement in blood sugar keep it up.  It is important that you exercise regularly at least 30 minutes 5 times a week. If you develop chest pain, have severe difficulty breathing, or feel very tired, stop exercising immediately and seek medical attention  Think about what you will eat, plan ahead. Choose " clean, green, fresh or frozen" over canned, processed or packaged foods which are more sugary, salty and fatty. 70 to 75% of food eaten should be vegetables and fruit. Three meals at set times with snacks allowed between meals, but they must be fruit or vegetables. Aim to eat over a 12 hour period , example 7 am to 7 pm, and STOP after  your last meal of the day. Drink water,generally about 64 ounces per day, no other drink is as healthy. Fruit juice is best enjoyed in a healthy way, by EATING the fruit. Thanks for choosing [facility], we consider it a privelige to serve you.

## 2020-02-03 ENCOUNTER — Encounter: Payer: Self-pay | Admitting: Family Medicine

## 2020-02-03 NOTE — Assessment & Plan Note (Signed)
Controlled, no change in medication  

## 2020-02-03 NOTE — Assessment & Plan Note (Signed)
  Patient re-educated about  the importance of commitment to a  minimum of 150 minutes of exercise per week as able.  The importance of healthy food choices with portion control discussed, as well as eating regularly and within a 12 hour window most days. The need to choose "clean , green" food 50 to 75% of the time is discussed, as well as to make water the primary drink and set a goal of 64 ounces water daily.    Weight /BMI 02/02/2020 12/23/2019 11/09/2019  WEIGHT 185 lb 192 lb 192 lb 3.2 oz  HEIGHT 5\' 6"  5\' 6"  5\' 6"   BMI 29.86 kg/m2 30.99 kg/m2 31.02 kg/m2

## 2020-02-03 NOTE — Assessment & Plan Note (Signed)
Symptomatic, self collected specimen sent for testing Fluconazole prescribed
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
## 2020-02-03 NOTE — Assessment & Plan Note (Signed)
Marked improved, control on orl med Ms. Slaubaugh is reminded of the importance of commitment to daily physical activity for 30 minutes or more, as able and the need to limit carbohydrate intake to 30 to 60 grams per meal to help with blood sugar control.   The need to take medication as prescribed, test blood sugar as directed, and to call between visits if there is a concern that blood sugar is uncontrolled is also discussed.   Ms. Reyburn is reminded of the importance of daily foot exam, annual eye examination, and good blood sugar, blood pressure and cholesterol control.  Diabetic Labs Latest Ref Rng & Units 11/09/2019 07/30/2019 03/09/2019 09/12/2018 07/03/2018  HbA1c 4.0 - 5.6 % 6.6(A) 13.7(H) 7.4(H) 6.4(H) -  Microalbumin mg/dL - 1.1 - - 6.2(H)  Micro/Creat Ratio <30 mcg/mg creat - 11 - - -  Chol <200 mg/dL - 017 494 496 -  HDL > OR = 50 mg/dL - 75(F) 16(B) 84(Y) -  Calc LDL mg/dL (calc) - 54 59 58 -  Triglycerides <150 mg/dL - 659 64 80 -  Creatinine 0.50 - 0.99 mg/dL - 9.35(T) 0.17(B) 9.39(Q) -   BP/Weight 02/02/2020 12/23/2019 11/09/2019 09/30/2019 09/10/2019 08/05/2019 07/29/2019  Systolic BP 119 118 118 111 132 132 119  Diastolic BP 78 71 77 72 76 76 82  Wt. (Lbs) 185 192 192.2 197.8 200 208.08 225  BMI 29.86 30.99 31.02 31.93 32.28 33.58 36.32   Foot/eye exam completion dates Latest Ref Rng & Units 07/11/2018 07/03/2018  Eye Exam No Retinopathy No Retinopathy -  Foot Form Completion - - Done

## 2020-02-03 NOTE — Assessment & Plan Note (Signed)
Controlled, no change in medication DASH diet and commitment to daily physical activity for a minimum of 30 minutes discussed and encouraged, as a part of hypertension management. The importance of attaining a healthy weight is also discussed.  BP/Weight 02/02/2020 12/23/2019 11/09/2019 09/30/2019 09/10/2019 08/05/2019 07/29/2019  Systolic BP 119 118 118 111 132 132 119  Diastolic BP 78 71 77 72 76 76 82  Wt. (Lbs) 185 192 192.2 197.8 200 208.08 225  BMI 29.86 30.99 31.02 31.93 32.28 33.58 36.32

## 2020-02-04 LAB — CERVICOVAGINAL ANCILLARY ONLY
Bacterial Vaginitis (gardnerella): NEGATIVE
Candida Glabrata: NEGATIVE
Candida Vaginitis: POSITIVE — AB
Chlamydia: NEGATIVE
Comment: NEGATIVE
Comment: NEGATIVE
Comment: NEGATIVE
Comment: NEGATIVE
Comment: NEGATIVE
Comment: NORMAL
Neisseria Gonorrhea: NEGATIVE
Trichomonas: NEGATIVE

## 2020-02-10 ENCOUNTER — Other Ambulatory Visit: Payer: Self-pay | Admitting: Family Medicine

## 2020-02-10 ENCOUNTER — Encounter: Payer: Self-pay | Admitting: Family Medicine

## 2020-02-10 MED ORDER — FLUCONAZOLE 150 MG PO TABS
ORAL_TABLET | ORAL | 0 refills | Status: DC
Start: 2020-02-10 — End: 2020-03-15

## 2020-02-10 NOTE — Progress Notes (Signed)
Fluconazole 150  

## 2020-02-15 ENCOUNTER — Ambulatory Visit (HOSPITAL_COMMUNITY): Admission: RE | Admit: 2020-02-15 | Payer: BC Managed Care – PPO | Source: Ambulatory Visit

## 2020-02-22 ENCOUNTER — Other Ambulatory Visit: Payer: Self-pay | Admitting: Family Medicine

## 2020-02-23 ENCOUNTER — Other Ambulatory Visit: Payer: Self-pay | Admitting: Family Medicine

## 2020-02-24 ENCOUNTER — Other Ambulatory Visit: Payer: Self-pay | Admitting: Family Medicine

## 2020-02-25 ENCOUNTER — Other Ambulatory Visit: Payer: Self-pay

## 2020-02-25 ENCOUNTER — Encounter: Payer: Self-pay | Admitting: Family Medicine

## 2020-02-25 ENCOUNTER — Ambulatory Visit (HOSPITAL_COMMUNITY)
Admission: RE | Admit: 2020-02-25 | Discharge: 2020-02-25 | Disposition: A | Discharge status: Home / Self Care | Payer: BC Managed Care – PPO | Source: Ambulatory Visit | Attending: Family Medicine | Admitting: Family Medicine

## 2020-02-25 DIAGNOSIS — Z1231 Encounter for screening mammogram for malignant neoplasm of breast: Secondary | ICD-10-CM

## 2020-02-26 ENCOUNTER — Other Ambulatory Visit: Payer: Self-pay | Admitting: *Deleted

## 2020-02-26 MED ORDER — BACLOFEN 10 MG PO TABS
10.0000 mg | ORAL_TABLET | Freq: Three times a day (TID) | ORAL | 1 refills | Status: DC
Start: 2020-02-26 — End: 2020-05-24

## 2020-02-29 ENCOUNTER — Other Ambulatory Visit: Payer: Self-pay | Admitting: "Endocrinology

## 2020-02-29 DIAGNOSIS — E1165 Type 2 diabetes mellitus with hyperglycemia: Secondary | ICD-10-CM

## 2020-03-09 ENCOUNTER — Other Ambulatory Visit: Payer: Self-pay | Admitting: "Endocrinology

## 2020-03-09 DIAGNOSIS — E559 Vitamin D deficiency, unspecified: Secondary | ICD-10-CM | POA: Diagnosis not present

## 2020-03-09 LAB — VITAMIN D 25 HYDROXY (VIT D DEFICIENCY, FRACTURES): Vit D, 25-Hydroxy: 37.4

## 2020-03-09 LAB — COMPREHENSIVE METABOLIC PANEL
Calcium: 9.1 (ref 8.7–10.7)
GFR calc Af Amer: 73
GFR calc non Af Amer: 63

## 2020-03-09 LAB — MICROALBUMIN, URINE: Microalb, Ur: 5.2

## 2020-03-09 LAB — TSH: TSH: 2.57 (ref 0.41–5.90)

## 2020-03-09 LAB — LIPID PANEL
Cholesterol: 113 (ref 0–200)
HDL: 47 (ref 35–70)
LDL Cholesterol: 55
Triglycerides: 44 (ref 40–160)

## 2020-03-09 LAB — BASIC METABOLIC PANEL
BUN: 11 (ref 4–21)
Creatinine: 1 (ref 0.5–1.1)

## 2020-03-10 LAB — COMPREHENSIVE METABOLIC PANEL
ALT: 17 IU/L (ref 0–32)
AST: 19 IU/L (ref 0–40)
Albumin/Globulin Ratio: 1.4 (ref 1.2–2.2)
Albumin: 4.1 g/dL (ref 3.8–4.9)
Alkaline Phosphatase: 91 IU/L (ref 44–121)
BUN/Creatinine Ratio: 11 — ABNORMAL LOW (ref 12–28)
BUN: 11 mg/dL (ref 8–27)
Bilirubin Total: 0.3 mg/dL (ref 0.0–1.2)
CO2: 26 mmol/L (ref 20–29)
Calcium: 9.1 mg/dL (ref 8.7–10.3)
Chloride: 106 mmol/L (ref 96–106)
Creatinine, Ser: 0.98 mg/dL (ref 0.57–1.00)
GFR calc Af Amer: 73 mL/min/{1.73_m2} (ref >59)
GFR calc non Af Amer: 63 mL/min/{1.73_m2} (ref >59)
Globulin, Total: 2.9 g/dL (ref 1.5–4.5)
Glucose: 86 mg/dL (ref 65–99)
Potassium: 4 mmol/L (ref 3.5–5.2)
Sodium: 144 mmol/L (ref 134–144)
Total Protein: 7 g/dL (ref 6.0–8.5)

## 2020-03-10 LAB — LIPID PANEL W/O CHOL/HDL RATIO
Cholesterol, Total: 113 mg/dL (ref 100–199)
HDL: 47 mg/dL (ref >39)
LDL Chol Calc (NIH): 55 mg/dL (ref 0–99)
Triglycerides: 44 mg/dL (ref 0–149)
VLDL Cholesterol Cal: 11 mg/dL (ref 5–40)

## 2020-03-10 LAB — TSH: TSH: 2.57 u[IU]/mL (ref 0.450–4.500)

## 2020-03-10 LAB — MICROALBUMIN, URINE: Microalbumin, Urine: 5.2 ug/mL

## 2020-03-10 LAB — VITAMIN D 25 HYDROXY (VIT D DEFICIENCY, FRACTURES): Vit D, 25-Hydroxy: 37.4 ng/mL (ref 30.0–100.0)

## 2020-03-10 LAB — T4, FREE: Free T4: 0.99 ng/dL (ref 0.82–1.77)

## 2020-03-15 ENCOUNTER — Other Ambulatory Visit: Payer: Self-pay

## 2020-03-15 ENCOUNTER — Encounter: Payer: Self-pay | Admitting: "Endocrinology

## 2020-03-15 ENCOUNTER — Ambulatory Visit (INDEPENDENT_AMBULATORY_CARE_PROVIDER_SITE_OTHER): Payer: BC Managed Care – PPO | Admitting: "Endocrinology

## 2020-03-15 VITALS — BP 136/84 | HR 88 | Ht 66.0 in | Wt 188.2 lb

## 2020-03-15 DIAGNOSIS — E1165 Type 2 diabetes mellitus with hyperglycemia: Secondary | ICD-10-CM

## 2020-03-15 DIAGNOSIS — E559 Vitamin D deficiency, unspecified: Secondary | ICD-10-CM | POA: Diagnosis not present

## 2020-03-15 DIAGNOSIS — E782 Mixed hyperlipidemia: Secondary | ICD-10-CM | POA: Diagnosis not present

## 2020-03-15 LAB — POCT GLYCOSYLATED HEMOGLOBIN (HGB A1C): Hemoglobin A1C: 6 % — AB (ref 4.0–5.6)

## 2020-03-15 MED ORDER — JANUMET XR 50-500 MG PO TB24: 1.0000 | ORAL_TABLET | Freq: Every day | ORAL | 1 refills | Status: DC

## 2020-03-15 NOTE — Patient Instructions (Signed)
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
## 2020-03-29 ENCOUNTER — Other Ambulatory Visit: Payer: Self-pay | Admitting: Family Medicine

## 2020-04-13 ENCOUNTER — Other Ambulatory Visit: Payer: Self-pay | Admitting: Family Medicine

## 2020-04-25 ENCOUNTER — Other Ambulatory Visit: Payer: Self-pay | Admitting: Family Medicine

## 2020-04-27 ENCOUNTER — Encounter: Payer: Medicare Other | Admitting: Family Medicine

## 2020-04-28 ENCOUNTER — Telehealth (INDEPENDENT_AMBULATORY_CARE_PROVIDER_SITE_OTHER): Payer: BC Managed Care – PPO | Admitting: Family Medicine

## 2020-04-28 ENCOUNTER — Encounter: Payer: Self-pay | Admitting: Family Medicine

## 2020-04-28 ENCOUNTER — Other Ambulatory Visit: Payer: Self-pay

## 2020-04-28 DIAGNOSIS — Z0000 Encounter for general adult medical examination without abnormal findings: Secondary | ICD-10-CM | POA: Diagnosis not present
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
## 2020-04-28 NOTE — Patient Instructions (Signed)
Brittany Nunez , Thank you for taking time to come for your Medicare Wellness Visit. I appreciate your ongoing commitment to your health goals. Please review the following plan we discussed and let me know if I can assist you in the future.   Screening recommendations/referrals: Colonoscopy: Complete: DUE 11/21/21  Mammogram: Complete: DUE 02/24/22  Bone Density: Complete   Recommended yearly ophthalmology/optometry visit for glaucoma screening and checkup Recommended yearly dental visit for hygiene and checkup  Vaccinations: Influenza vaccine: Scheduled for 05/06/20 @ 9:00am  Pneumococcal vaccine: Complete  Tdap vaccine: Complete: DUE 02/11/24  Shingles vaccine: Eligible pending insurance coverage.   Advanced directives: N/A   Conditions/risks identified: None   Next appointment: May 06, 2020 @ 9am   Preventive Care 40-64 Years, Female Preventive care refers to lifestyle choices and visits with your health care provider that can promote health and wellness. What does preventive care include?  A yearly physical exam. This is also called an annual well check.  Dental exams once or twice a year.  Routine eye exams. Ask your health care provider how often you should have your eyes checked.  Personal lifestyle choices, including:  Daily care of your teeth and gums.  Regular physical activity.  Eating a healthy diet.  Avoiding tobacco and drug use.  Limiting alcohol use.  Practicing safe sex.  Taking low-dose aspirin daily starting at age 15.  Taking vitamin and mineral supplements as recommended by your health care provider. What happens during an annual well check? The services and screenings done by your health care provider during your annual well check will depend on your age, overall health, lifestyle risk factors, and family history of disease. Counseling  Your health care provider may ask you questions about your:  Alcohol use.  Tobacco use.  Drug  use.  Emotional well-being.  Home and relationship well-being.  Sexual activity.  Eating habits.  Work and work Statistician.  Method of birth control.  Menstrual cycle.  Pregnancy history. Screening  You may have the following tests or measurements:  Height, weight, and BMI.  Blood pressure.  Lipid and cholesterol levels. These may be checked every 5 years, or more frequently if you are over 33 years old.  Skin check.  Lung cancer screening. You may have this screening every year starting at age 5 if you have a 30-pack-year history of smoking and currently smoke or have quit within the past 15 years.  Fecal occult blood test (FOBT) of the stool. You may have this test every year starting at age 26.  Flexible sigmoidoscopy or colonoscopy. You may have a sigmoidoscopy every 5 years or a colonoscopy every 10 years starting at age 33.  Hepatitis C blood test.  Hepatitis B blood test.  Sexually transmitted disease (STD) testing.  Diabetes screening. This is done by checking your blood sugar (glucose) after you have not eaten for a while (fasting). You may have this done every 1-3 years.  Mammogram. This may be done every 1-2 years. Talk to your health care provider about when you should start having regular mammograms. This may depend on whether you have a family history of breast cancer.  BRCA-related cancer screening. This may be done if you have a family history of breast, ovarian, tubal, or peritoneal cancers.  Pelvic exam and Pap test. This may be done every 3 years starting at age 32. Starting at age 49, this may be done every 5 years if you have a Pap test in combination with an  HPV test.  Bone density scan. This is done to screen for osteoporosis. You may have this scan if you are at high risk for osteoporosis. Discuss your test results, treatment options, and if necessary, the need for more tests with your health care provider. Vaccines  Your health care  provider may recommend certain vaccines, such as:  Influenza vaccine. This is recommended every year.  Tetanus, diphtheria, and acellular pertussis (Tdap, Td) vaccine. You may need a Td booster every 10 years.  Zoster vaccine. You may need this after age 45.  Pneumococcal 13-valent conjugate (PCV13) vaccine. You may need this if you have certain conditions and were not previously vaccinated.  Pneumococcal polysaccharide (PPSV23) vaccine. You may need one or two doses if you smoke cigarettes or if you have certain conditions. Talk to your health care provider about which screenings and vaccines you need and how often you need them. This information is not intended to replace advice given to you by your health care provider. Make sure you discuss any questions you have with your health care provider. Document Released: 07/08/2015 Document Revised: 02/29/2016 Document Reviewed: 04/12/2015 Elsevier Interactive Patient Education  2017 [place] Prevention in the Home Falls can cause injuries. They can happen to people of all ages. There are many things you can do to make your home safe and to help prevent falls. What can I do on the outside of my home?  Regularly fix the edges of walkways and driveways and fix any cracks.  Remove anything that might make you trip as you walk through a door, such as a raised step or threshold.  Trim any bushes or trees on the path to your home.  Use bright outdoor lighting.  Clear any walking paths of anything that might make someone trip, such as rocks or tools.  Regularly check to see if handrails are loose or broken. Make sure that both sides of any steps have handrails.  Any raised decks and porches should have guardrails on the edges.  Have any leaves, snow, or ice cleared regularly.  Use sand or salt on walking paths during winter.  Clean up any spills in your garage right away. This includes oil or grease spills. What can I do  in the bathroom?  Use night lights.  Install grab bars by the toilet and in the tub and shower. Do not use towel bars as grab bars.  Use non-skid mats or decals in the tub or shower.  If you need to sit down in the shower, use a plastic, non-slip stool.  Keep the floor dry. Clean up any water that spills on the floor as soon as it happens.  Remove soap buildup in the tub or shower regularly.  Attach bath mats securely with double-sided non-slip rug tape.  Do not have throw rugs and other things on the floor that can make you trip. What can I do in the bedroom?  Use night lights.  Make sure that you have a light by your bed that is easy to reach.  Do not use any sheets or blankets that are too big for your bed. They should not hang down onto the floor.  Have a firm chair that has side arms. You can use this for support while you get dressed.  Do not have throw rugs and other things on the floor that can make you trip. What can I do in the kitchen?  Clean up any spills right away.  Avoid walking on wet floors.  Keep items that you use a lot in easy-to-reach places.  If you need to reach something above you, use a strong step stool that has a grab bar.  Keep electrical cords out of the way.  Do not use floor polish or wax that makes floors slippery. If you must use wax, use non-skid floor wax.  Do not have throw rugs and other things on the floor that can make you trip. What can I do with my stairs?  Do not leave any items on the stairs.  Make sure that there are handrails on both sides of the stairs and use them. Fix handrails that are broken or loose. Make sure that handrails are as long as the stairways.  Check any carpeting to make sure that it is firmly attached to the stairs. Fix any carpet that is loose or worn.  Avoid having throw rugs at the top or bottom of the stairs. If you do have throw rugs, attach them to the floor with carpet tape.  Make sure that you  have a light switch at the top of the stairs and the bottom of the stairs. If you do not have them, ask someone to add them for you. What else can I do to help prevent falls?  Wear shoes that:  Do not have high heels.  Have rubber bottoms.  Are comfortable and fit you well.  Are closed at the toe. Do not wear sandals.  If you use a stepladder:  Make sure that it is fully opened. Do not climb a closed stepladder.  Make sure that both sides of the stepladder are locked into place.  Ask someone to hold it for you, if possible.  Clearly mark and make sure that you can see:  Any grab bars or handrails.  First and last steps.  Where the edge of each step is.  Use tools that help you move around (mobility aids) if they are needed. These include:  Canes.  Walkers.  Scooters.  Crutches.  Turn on the lights when you go into a dark area. Replace any light bulbs as soon as they burn out.  Set up your furniture so you have a clear path. Avoid moving your furniture around.  If any of your floors are uneven, fix them.  If there are any pets around you, be aware of where they are.  Review your medicines with your doctor. Some medicines can make you feel dizzy. This can increase your chance of falling. Ask your doctor what other things that you can do to help prevent falls. This information is not intended to replace advice given to you by your health care provider. Make sure you discuss any questions you have with your health care provider. Document Released: 04/07/2009 Document Revised: 11/17/2015 Document Reviewed: 07/16/2014 Elsevier Interactive Patient Education  2017 [employer].

## 2020-05-06 ENCOUNTER — Other Ambulatory Visit: Payer: Self-pay

## 2020-05-06 ENCOUNTER — Ambulatory Visit (INDEPENDENT_AMBULATORY_CARE_PROVIDER_SITE_OTHER): Payer: BC Managed Care – PPO

## 2020-05-06 DIAGNOSIS — Z23 Encounter for immunization: Secondary | ICD-10-CM

## 2020-05-09 ENCOUNTER — Other Ambulatory Visit: Payer: Self-pay

## 2020-05-09 MED ORDER — VENLAFAXINE HCL ER 75 MG PO CP24
ORAL_CAPSULE | ORAL | 3 refills | Status: DC
Start: 2020-05-09 — End: 2020-09-13

## 2020-05-23 ENCOUNTER — Other Ambulatory Visit: Payer: Self-pay | Admitting: Family Medicine

## 2020-05-24 ENCOUNTER — Other Ambulatory Visit: Payer: Self-pay

## 2020-05-24 ENCOUNTER — Ambulatory Visit (INDEPENDENT_AMBULATORY_CARE_PROVIDER_SITE_OTHER): Payer: BC Managed Care – PPO | Admitting: Nurse Practitioner

## 2020-05-24 ENCOUNTER — Encounter: Payer: Self-pay | Admitting: Nurse Practitioner

## 2020-05-24 VITALS — BP 98/66 | HR 110 | Temp 98.2°F | Resp 20 | Ht 66.0 in | Wt 189.0 lb

## 2020-05-24 DIAGNOSIS — S39012A Strain of muscle, fascia and tendon of lower back, initial encounter: Secondary | ICD-10-CM | POA: Diagnosis not present

## 2020-05-24 MED ORDER — IBUPROFEN 600 MG PO TABS: 600.0000 mg | ORAL_TABLET | Freq: Three times a day (TID) | ORAL | 0 refills | Status: DC | PRN

## 2020-05-24 MED ORDER — TIZANIDINE HCL 4 MG PO TABS: 4.0000 mg | ORAL_TABLET | Freq: Four times a day (QID) | ORAL | 0 refills | Status: DC | PRN

## 2020-05-24 MED ORDER — PREDNISONE 10 MG PO TABS: ORAL_TABLET | ORAL | 0 refills | Status: AC

## 2020-05-24 NOTE — Patient Instructions (Signed)
Acute Back Pain, Adult Acute back pain is sudden and usually short-lived. It is often caused by an injury to the muscles and tissues in the back. The injury may result from:  A muscle or ligament getting overstretched or torn (strained). Ligaments are tissues that connect bones to each other. Lifting something improperly can cause a back strain.  Wear and tear (degeneration) of the spinal disks. Spinal disks are circular tissue that provides cushioning between the bones of the spine (vertebrae).  Twisting motions, such as while playing sports or doing yard work.  A hit to the back.  Arthritis. You may have a physical exam, lab tests, and imaging tests to find the cause of your pain. Acute back pain usually goes away with rest and home care. Follow these instructions at home: Managing pain, stiffness, and swelling  Take over-the-counter and prescription medicines only as told by your health care provider.  Your health care provider may recommend applying ice during the first 24-48 hours after your pain starts. To do this: ? Put ice in a plastic bag. ? Place a towel between your skin and the bag. ? Leave the ice on for 20 minutes, 2-3 times a day.  If directed, apply heat to the affected area as often as told by your health care provider. Use the heat source that your health care provider recommends, such as a moist heat pack or a heating pad. ? Place a towel between your skin and the heat source. ? Leave the heat on for 20-30 minutes. ? Remove the heat if your skin turns bright red. This is especially important if you are unable to feel pain, heat, or cold. You have a greater risk of getting burned. Activity   Do not stay in bed. Staying in bed for more than 1-2 days can delay your recovery.  Sit up and stand up straight. Avoid leaning forward when you sit, or hunching over when you stand. ? If you work at a desk, sit close to it so you do not need to lean over. Keep your chin tucked  in. Keep your neck drawn back, and keep your elbows bent at a right angle. Your arms should look like the letter "L." ? Sit high and close to the steering wheel when you drive. Add lower back (lumbar) support to your car seat, if needed.  Take short walks on even surfaces as soon as you are able. Try to increase the length of time you walk each day.  Do not sit, drive, or stand in one place for more than 30 minutes at a time. Sitting or standing for long periods of time can put stress on your back.  Do not drive or use heavy machinery while taking prescription pain medicine.  Use proper lifting techniques. When you bend and lift, use positions that put less stress on your back: ? Bend your knees. ? Keep the load close to your body. ? Avoid twisting.  Exercise regularly as told by your health care provider. Exercising helps your back heal faster and helps prevent back injuries by keeping muscles strong and flexible.  Work with a physical therapist to make a safe exercise program, as recommended by your health care provider. Do any exercises as told by your physical therapist. Lifestyle  Maintain a healthy weight. Extra weight puts stress on your back and makes it difficult to have good posture.  Avoid activities or situations that make you feel anxious or stressed. Stress and anxiety increase muscle   tension and can make back pain worse. Learn ways to manage anxiety and stress, such as through exercise. General instructions  Sleep on a firm mattress in a comfortable position. Try lying on your side with your knees slightly bent. If you lie on your back, put a pillow under your knees.  Follow your treatment plan as told by your health care provider. This may include: ? Cognitive or behavioral therapy. ? Acupuncture or massage therapy. ? Meditation or yoga. Contact a health care provider if:  You have pain that is not relieved with rest or medicine.  You have increasing pain going down  into your legs or buttocks.  Your pain does not improve after 2 weeks.  You have pain at night.  You lose weight without trying.  You have a fever or chills. Get help right away if:  You develop new bowel or bladder control problems.  You have unusual weakness or numbness in your arms or legs.  You develop nausea or vomiting.  You develop abdominal pain.  You feel faint. Summary  Acute back pain is sudden and usually short-lived.  Use proper lifting techniques. When you bend and lift, use positions that put less stress on your back.  Take over-the-counter and prescription medicines and apply heat or ice as directed by your health care provider. This information is not intended to replace advice given to you by your health care provider. Make sure you discuss any questions you have with your health care provider. Document Revised: 09/30/2018 Document Reviewed: 01/23/2017 Elsevier Patient Education  2020 [employer]  
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
## 2020-05-24 NOTE — Assessment & Plan Note (Signed)
-  low back pain started 5 days ago while cooking  -stop baclofen while taking tizanidine -Rx. Tizanidine -Rx. Prednisone -Rx. Ibuprofen -if no improvement will consider ortho referral

## 2020-05-26 ENCOUNTER — Other Ambulatory Visit: Payer: Self-pay | Admitting: "Endocrinology

## 2020-05-26 DIAGNOSIS — E1165 Type 2 diabetes mellitus with hyperglycemia: Secondary | ICD-10-CM

## 2020-06-28 ENCOUNTER — Other Ambulatory Visit: Payer: Self-pay | Admitting: Family Medicine

## 2020-06-30 ENCOUNTER — Other Ambulatory Visit: Payer: Self-pay

## 2020-06-30 ENCOUNTER — Telehealth: Payer: Self-pay

## 2020-06-30 MED ORDER — TEMAZEPAM 30 MG PO CAPS: ORAL_CAPSULE | ORAL | 3 refills | Status: DC

## 2020-06-30 NOTE — Telephone Encounter (Signed)
Patient requesting a refill on temazepam p# [phone]

## 2020-06-30 NOTE — Telephone Encounter (Signed)
This was refilled to [facility] on 06/28/20

## 2020-07-04 ENCOUNTER — Other Ambulatory Visit: Payer: Self-pay | Admitting: Family Medicine

## 2020-07-07 ENCOUNTER — Telehealth: Payer: Self-pay

## 2020-07-07 ENCOUNTER — Other Ambulatory Visit: Payer: Self-pay | Admitting: Family Medicine

## 2020-07-07 MED ORDER — TEMAZEPAM 30 MG PO CAPS: 30.0000 mg | ORAL_CAPSULE | Freq: Every evening | ORAL | 0 refills | Status: DC | PRN

## 2020-07-07 NOTE — Telephone Encounter (Signed)
I will send in 30 day supply and she needs an appointment with me scheduled I next 1 to 3 weeks please

## 2020-07-07 NOTE — Telephone Encounter (Signed)
LVM for pt to call the office to schedule an appt. °

## 2020-07-07 NOTE — Telephone Encounter (Signed)
Please call [facility], they are telling the pt they dont have  temazepam (RESTORIL) 30 MG capsule

## 2020-07-07 NOTE — Telephone Encounter (Signed)
Can this be sent electronically?

## 2020-07-13 NOTE — Telephone Encounter (Signed)
Needs appt in 2 weeks with Dr Kathie Rhodes

## 2020-07-13 NOTE — Telephone Encounter (Signed)
Lvm for patient to call back to schedule

## 2020-07-14 ENCOUNTER — Other Ambulatory Visit: Payer: Self-pay | Admitting: Family Medicine

## 2020-07-14 NOTE — Telephone Encounter (Signed)
Sent mychart message that she needed to call and schedule an appt

## 2020-07-27 ENCOUNTER — Ambulatory Visit: Payer: BC Managed Care – PPO | Admitting: Internal Medicine

## 2020-07-27 ENCOUNTER — Ambulatory Visit: Payer: BC Managed Care – PPO | Admitting: Family Medicine

## 2020-07-28 ENCOUNTER — Ambulatory Visit: Payer: BC Managed Care – PPO | Admitting: Internal Medicine

## 2020-08-02 ENCOUNTER — Ambulatory Visit (INDEPENDENT_AMBULATORY_CARE_PROVIDER_SITE_OTHER): Payer: BC Managed Care – PPO | Admitting: Internal Medicine

## 2020-08-02 ENCOUNTER — Encounter: Payer: Self-pay | Admitting: Internal Medicine

## 2020-08-02 ENCOUNTER — Other Ambulatory Visit: Payer: Self-pay

## 2020-08-02 VITALS — BP 98/65 | HR 116 | Temp 98.0°F | Resp 18 | Ht 66.0 in | Wt 196.4 lb

## 2020-08-02 DIAGNOSIS — E1169 Type 2 diabetes mellitus with other specified complication: Secondary | ICD-10-CM

## 2020-08-02 DIAGNOSIS — I10 Essential (primary) hypertension: Secondary | ICD-10-CM | POA: Diagnosis not present

## 2020-08-02 DIAGNOSIS — E1159 Type 2 diabetes mellitus with other circulatory complications: Secondary | ICD-10-CM

## 2020-08-02 DIAGNOSIS — E785 Hyperlipidemia, unspecified: Secondary | ICD-10-CM

## 2020-08-02 DIAGNOSIS — G4700 Insomnia, unspecified: Secondary | ICD-10-CM

## 2020-08-02 DIAGNOSIS — F321 Major depressive disorder, single episode, moderate: Secondary | ICD-10-CM

## 2020-08-02 DIAGNOSIS — Z0283 Encounter for blood-alcohol and blood-drug test: Secondary | ICD-10-CM | POA: Diagnosis not present

## 2020-08-02 DIAGNOSIS — K219 Gastro-esophageal reflux disease without esophagitis: Secondary | ICD-10-CM

## 2020-08-02 MED ORDER — ROSUVASTATIN CALCIUM 5 MG PO TABS
5.0000 mg | ORAL_TABLET | Freq: Every day | ORAL | 0 refills | Status: DC
Start: 2020-08-02 — End: 2020-11-07

## 2020-08-02 MED ORDER — TEMAZEPAM 30 MG PO CAPS: 30.0000 mg | ORAL_CAPSULE | Freq: Every evening | ORAL | 2 refills | Status: DC | PRN

## 2020-08-02 NOTE — Assessment & Plan Note (Signed)
Stable On Effexor Refilled Restoril for insomnia

## 2020-08-02 NOTE — Patient Instructions (Signed)
Please continue taking medications as prescribed.  Please follow low carbohydrate diet.  Please schedule an appointment with Ophthalmologist for diabetic eye exam.

## 2020-08-02 NOTE — Assessment & Plan Note (Signed)
BP Readings from Last 1 Encounters:  08/02/20 98/65   Well-controlled Counseled for compliance with the medications Advised DASH diet and moderate exercise/walking, at least 150 mins/week

## 2020-08-02 NOTE — Assessment & Plan Note (Signed)
On Crestor F/u lipid profile

## 2020-08-02 NOTE — Assessment & Plan Note (Signed)
On Omeprazole 

## 2020-08-02 NOTE — Assessment & Plan Note (Signed)
HbA1C: 6.0 On Janumet Advised to follow diabetic diet On statin F/u CMP and lipid panel Diabetic eye exam: Advised to follow up with Ophthalmology for diabetic eye exam
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
## 2020-08-03 LAB — CMP14+EGFR
ALT: 10 IU/L (ref 0–32)
AST: 14 IU/L (ref 0–40)
Albumin/Globulin Ratio: 1.5 (ref 1.2–2.2)
Albumin: 4.1 g/dL (ref 3.8–4.8)
Alkaline Phosphatase: 93 IU/L (ref 44–121)
BUN/Creatinine Ratio: 14 (ref 12–28)
BUN: 14 mg/dL (ref 8–27)
Bilirubin Total: <0.2 mg/dL (ref 0.0–1.2)
CO2: 24 mmol/L (ref 20–29)
Calcium: 9.5 mg/dL (ref 8.7–10.3)
Chloride: 105 mmol/L (ref 96–106)
Creatinine, Ser: 1.03 mg/dL — ABNORMAL HIGH (ref 0.57–1.00)
GFR calc Af Amer: 68 mL/min/{1.73_m2} (ref >59)
GFR calc non Af Amer: 59 mL/min/{1.73_m2} — ABNORMAL LOW (ref >59)
Globulin, Total: 2.7 g/dL (ref 1.5–4.5)
Glucose: 86 mg/dL (ref 65–99)
Potassium: 4.7 mmol/L (ref 3.5–5.2)
Sodium: 145 mmol/L — ABNORMAL HIGH (ref 134–144)
Total Protein: 6.8 g/dL (ref 6.0–8.5)

## 2020-08-03 LAB — LIPID PANEL
Chol/HDL Ratio: 2.7 ratio (ref 0.0–4.4)
Cholesterol, Total: 134 mg/dL (ref 100–199)
HDL: 50 mg/dL (ref >39)
LDL Chol Calc (NIH): 68 mg/dL (ref 0–99)
Triglycerides: 80 mg/dL (ref 0–149)
VLDL Cholesterol Cal: 16 mg/dL (ref 5–40)

## 2020-08-03 LAB — HEMOGLOBIN A1C
Est. average glucose Bld gHb Est-mCnc: 146 mg/dL
Hgb A1c MFr Bld: 6.7 % — ABNORMAL HIGH (ref 4.8–5.6)

## 2020-08-03 LAB — CBC
Hematocrit: 35.5 % (ref 34.0–46.6)
Hemoglobin: 11.5 g/dL (ref 11.1–15.9)
MCH: 25.7 pg — ABNORMAL LOW (ref 26.6–33.0)
MCHC: 32.4 g/dL (ref 31.5–35.7)
MCV: 79 fL (ref 79–97)
Platelets: 353 10*3/uL (ref 150–450)
RBC: 4.47 x10E6/uL (ref 3.77–5.28)
RDW: 14.2 % (ref 11.7–15.4)
WBC: 6.9 10*3/uL (ref 3.4–10.8)

## 2020-08-04 LAB — BENZODIAZEPINES SCREEN, URINE: BENZODIAZEPINE SCREEN, URINE: POSITIVE ng/mL — AB

## 2020-08-08 ENCOUNTER — Other Ambulatory Visit: Payer: Self-pay | Admitting: Family Medicine

## 2020-08-08 DIAGNOSIS — G4700 Insomnia, unspecified: Secondary | ICD-10-CM

## 2020-08-09 ENCOUNTER — Telehealth: Payer: Self-pay

## 2020-08-09 ENCOUNTER — Other Ambulatory Visit: Payer: Self-pay | Admitting: Family Medicine

## 2020-08-09 MED ORDER — TEMAZEPAM 30 MG PO CAPS: 30.0000 mg | ORAL_CAPSULE | Freq: Every day | ORAL | 3 refills | Status: DC

## 2020-08-09 NOTE — Telephone Encounter (Signed)
sent 

## 2020-08-09 NOTE — Telephone Encounter (Signed)
Requesting refill on temazepam please.

## 2020-08-10 ENCOUNTER — Telehealth: Payer: Self-pay | Admitting: "Endocrinology

## 2020-08-10 NOTE — Telephone Encounter (Signed)
Pt said she had her a1c checked 2/8 and it was 6.5. she is requesting a call back from nurse to see if her medication needs to be increased

## 2020-08-10 NOTE — Telephone Encounter (Signed)
Left a message requesting a return call to the office. 

## 2020-08-11 NOTE — Telephone Encounter (Signed)
Pt is taking janumet XR 50-500 once a day.

## 2020-08-11 NOTE — Telephone Encounter (Signed)
Discussed with pt, understanding voiced. 

## 2020-08-11 NOTE — Telephone Encounter (Signed)
No need to increase or change , keep same.

## 2020-08-23 ENCOUNTER — Telehealth: Payer: Self-pay

## 2020-08-23 ENCOUNTER — Other Ambulatory Visit: Payer: Self-pay | Admitting: Family Medicine

## 2020-08-23 NOTE — Telephone Encounter (Signed)
No voicemail available called patient to advise

## 2020-08-23 NOTE — Telephone Encounter (Signed)
Patient needing refill on temazepam sent to [facility] in [place] p# [phone]

## 2020-08-23 NOTE — Telephone Encounter (Signed)
Please tell her to call the pharmacy and tell them she needs a refill. She has 3 refills left on the rx

## 2020-09-06 ENCOUNTER — Other Ambulatory Visit: Payer: Self-pay | Admitting: Family Medicine

## 2020-09-13 ENCOUNTER — Encounter: Payer: Self-pay | Admitting: "Endocrinology

## 2020-09-13 ENCOUNTER — Other Ambulatory Visit: Payer: Self-pay | Admitting: Family Medicine

## 2020-09-13 ENCOUNTER — Other Ambulatory Visit: Payer: Self-pay

## 2020-09-13 ENCOUNTER — Ambulatory Visit (INDEPENDENT_AMBULATORY_CARE_PROVIDER_SITE_OTHER): Payer: BC Managed Care – PPO | Admitting: "Endocrinology

## 2020-09-13 VITALS — BP 113/71 | HR 82 | Ht 66.0 in | Wt 195.6 lb

## 2020-09-13 DIAGNOSIS — E782 Mixed hyperlipidemia: Secondary | ICD-10-CM | POA: Diagnosis not present

## 2020-09-13 DIAGNOSIS — I739 Peripheral vascular disease, unspecified: Secondary | ICD-10-CM | POA: Insufficient documentation

## 2020-09-13 DIAGNOSIS — E1165 Type 2 diabetes mellitus with hyperglycemia: Secondary | ICD-10-CM

## 2020-09-13 DIAGNOSIS — E559 Vitamin D deficiency, unspecified: Secondary | ICD-10-CM | POA: Diagnosis not present
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
## 2020-09-13 NOTE — Patient Instructions (Signed)
                                     Advice for Weight Management  -For most of us the best way to lose weight is by diet management. Generally speaking, diet management means consuming less calories intentionally which over time brings about progressive weight loss.  This can be achieved more effectively by restricting carbohydrate consumption to the minimum possible.  So, it is critically important to know your numbers: how much calorie you are consuming and how much calorie you need. More importantly, our carbohydrates sources should be unprocessed or minimally processed complex starch food items.   Sometimes, it is important to balance nutrition by increasing protein intake (animal or plant source), fruits, and vegetables.  -Sticking to a routine mealtime to eat 3 meals a day and avoiding unnecessary snacks is shown to have a big role in weight control. Under normal circumstances, the only time we lose real weight is when we are hungry, so allow hunger to take place- hunger means no food between meal times, only water.  It is not advisable to starve.   -It is better to avoid simple carbohydrates including: Cakes, Sweet Desserts, Ice Cream, Soda (diet and regular), Sweet Tea, Candies, Chips, Cookies, Store Bought Juices, Alcohol in Excess of  1-2 drinks a day, Lemonade,  Artificial Sweeteners, Doughnuts, Coffee Creamers, "Sugar-free" Products, etc, etc.  This is not a complete list.....    -Consulting with certified diabetes educators is proven to provide you with the most accurate and current information on diet.  Also, you may be  interested in discussing diet options/exchanges , we can schedule a visit with Brittany Nunez, RDN, CDE for individualized nutrition education.  -Exercise: If you are able: 30 -60 minutes a day ,4 days a week, or 150 minutes a week.  The longer the better.  Combine stretch, strength, and aerobic activities.  If you were told in the  past that you have high risk for cardiovascular diseases, you may seek evaluation by your heart doctor prior to initiating moderate to intense exercise programs.                                  Additional Care Considerations for Diabetes   -Diabetes  is a chronic disease.  The most important care consideration is regular follow-up with your diabetes care provider with the goal being avoiding or delaying its complications and to take advantage of advances in medications and technology.    -Type 2 diabetes is known to coexist with other important comorbidities such as high blood pressure and high cholesterol.  It is critical to control not only the diabetes but also the high blood pressure and high cholesterol to minimize and delay the risk of complications including coronary artery disease, stroke, amputations, blindness, etc.    - Studies showed that people with diabetes will benefit from a class of medications known as ACE inhibitors and statins.  Unless there are specific reasons not to be on these medications, the standard of care is to consider getting one from these groups of medications at an optimal doses.  These medications are generally considered safe and proven to help protect the heart and the kidneys.    - People with diabetes are encouraged to initiate and maintain regular follow-up with eye doctors, foot   doctors, dentists , and if necessary heart and kidney doctors.     - It is highly recommended that people with diabetes quit smoking or stay away from smoking, and get yearly  flu vaccine and pneumonia vaccine at least every 5 years.  One other important lifestyle recommendation is to ensure adequate sleep - at least 6-7 hours of uninterrupted sleep at night.  -Exercise: If you are able: 30 -60 minutes a day, 4 days a week, or 150 minutes a week.  The longer the better.  Combine stretch, strength, and aerobic activities.  If you were told in the past that you have high risk for  cardiovascular diseases, you may seek evaluation by your heart doctor prior to initiating moderate to intense exercise programs.          

## 2020-09-26 ENCOUNTER — Other Ambulatory Visit: Payer: Self-pay | Admitting: Family Medicine

## 2020-10-05 ENCOUNTER — Other Ambulatory Visit: Payer: Self-pay | Admitting: Family Medicine

## 2020-10-13 ENCOUNTER — Other Ambulatory Visit: Payer: Self-pay | Admitting: Family Medicine

## 2020-11-01 ENCOUNTER — Other Ambulatory Visit: Payer: Self-pay

## 2020-11-01 ENCOUNTER — Encounter: Payer: Self-pay | Admitting: Family Medicine

## 2020-11-01 ENCOUNTER — Ambulatory Visit (INDEPENDENT_AMBULATORY_CARE_PROVIDER_SITE_OTHER): Payer: BC Managed Care – PPO | Admitting: Family Medicine

## 2020-11-01 VITALS — BP 120/76 | HR 108 | Temp 98.6°F | Ht 66.0 in | Wt 196.0 lb

## 2020-11-01 DIAGNOSIS — E669 Obesity, unspecified: Secondary | ICD-10-CM | POA: Diagnosis not present

## 2020-11-01 DIAGNOSIS — R058 Other specified cough: Secondary | ICD-10-CM | POA: Diagnosis not present

## 2020-11-01 DIAGNOSIS — Z1231 Encounter for screening mammogram for malignant neoplasm of breast: Secondary | ICD-10-CM

## 2020-11-01 DIAGNOSIS — E785 Hyperlipidemia, unspecified: Secondary | ICD-10-CM | POA: Diagnosis not present

## 2020-11-01 DIAGNOSIS — E1159 Type 2 diabetes mellitus with other circulatory complications: Secondary | ICD-10-CM | POA: Diagnosis not present

## 2020-11-01 DIAGNOSIS — E1169 Type 2 diabetes mellitus with other specified complication: Secondary | ICD-10-CM

## 2020-11-01 DIAGNOSIS — J309 Allergic rhinitis, unspecified: Secondary | ICD-10-CM

## 2020-11-01 DIAGNOSIS — I10 Essential (primary) hypertension: Secondary | ICD-10-CM | POA: Diagnosis not present

## 2020-11-01 MED ORDER — MONTELUKAST SODIUM 10 MG PO TABS: 10.0000 mg | ORAL_TABLET | Freq: Every day | ORAL | 3 refills | Status: DC

## 2020-11-01 MED ORDER — BENZONATATE 100 MG PO CAPS: 100.0000 mg | ORAL_CAPSULE | Freq: Two times a day (BID) | ORAL | 0 refills | Status: DC | PRN

## 2020-11-01 NOTE — Patient Instructions (Signed)
Annual exam early September, call if you need me sooner   Singulair and tessalon perles are prescribed for uncontrolled allergies  If hoarseness and / or painful swallowing persist, please call in 1 month, I will increase dose of reflux medication short term  Microalb today  Congrats on good blood pressure and blood sugar and cholesterol and weight loss   Please schedule mammogram at checkout  You are referred for eye exam  It is important that you exercise regularly at least 30 minutes 5 times a week. If you develop chest pain, have severe difficulty breathing, or feel very tired, stop exercising immediately and seek medical attention  Think about what you will eat, plan ahead. Choose " clean, green, fresh or frozen" over canned, processed or packaged foods which are more sugary, salty and fatty. 70 to 75% of food eaten should be vegetables and fruit. Three meals at set times with snacks allowed between meals, but they must be fruit or vegetables. Aim to eat over a 12 hour period , example 7 am to 7 pm, and STOP after  your last meal of the day. Drink water,generally about 64 ounces per day, no other drink is as healthy. Fruit juice is best enjoyed in a healthy way, by EATING the fruit. Thanks for choosing [facility], we consider it a privelige to serve you.

## 2020-11-01 NOTE — Assessment & Plan Note (Signed)
[name] is reminded of the importance of commitment to daily physical activity for 30 minutes or more, as able and the need to limit carbohydrate intake to 30 to 60 grams per meal to help with blood sugar control.   The need to take medication as prescribed, test blood sugar as directed, and to call between visits if there is a concern that blood sugar is uncontrolled is also discussed.   [name] is reminded of the importance of daily foot exam, annual eye examination, and good blood sugar, blood pressure and cholesterol control.  Diabetic Labs Latest Ref Rng & Units 08/02/2020 03/15/2020 03/09/2020 03/09/2020 11/09/2019  HbA1c 4.8 - 5.6 % 6.7(H) 6.0(A) - - 6.6(A)  Microalbumin - - - - 5.2 -  Micro/Creat Ratio <30 mcg/mg creat - - - - -  Chol 100 - 199 mg/dL 262 - 035 597 -  HDL >41 mg/dL 50 - 47 47 -  Calc LDL 0 - 99 mg/dL 68 - 55 55 -  Triglycerides 0 - 149 mg/dL 80 - 44 44 -  Creatinine 0.57 - 1.00 mg/dL 6.38(G) - 5.36 1.0 -   BP/Weight 11/01/2020 09/13/2020 08/02/2020 05/24/2020 03/15/2020 02/02/2020 12/23/2019  Systolic BP 120 113 98 98 136 468 118  Diastolic BP 76 71 65 66 84 78 71  Wt. (Lbs) 196 195.6 196.4 189 188.2 185 192  BMI 31.64 31.57 31.7 30.51 30.38 29.86 30.99   Foot/eye exam completion dates Latest Ref Rng & Units 07/11/2018 07/03/2018  Eye Exam No Retinopathy No Retinopathy -  Foot Form Completion - - Done

## 2020-11-02 ENCOUNTER — Encounter: Payer: Self-pay | Admitting: Family Medicine

## 2020-11-02 DIAGNOSIS — R058 Other specified cough: Secondary | ICD-10-CM | POA: Insufficient documentation

## 2020-11-02 DIAGNOSIS — J309 Allergic rhinitis, unspecified: Secondary | ICD-10-CM | POA: Insufficient documentation

## 2020-11-02 NOTE — Assessment & Plan Note (Signed)
Currently uncontrolled start daily singulair

## 2020-11-02 NOTE — Assessment & Plan Note (Signed)
Hyperlipidemia:Low fat diet discussed and encouraged.   Lipid Panel  Lab Results  Component Value Date   CHOL 134 08/02/2020   HDL 50 08/02/2020   LDLCALC 68 08/02/2020   TRIG 80 08/02/2020   CHOLHDL 2.7 08/02/2020     Controlled, no change in medication

## 2020-11-02 NOTE — Assessment & Plan Note (Signed)
Controlled, no change in medication DASH diet and commitment to daily physical activity for a minimum of 30 minutes discussed and encouraged, as a part of hypertension management. The importance of attaining a healthy weight is also discussed.  BP/Weight 11/01/2020 09/13/2020 08/02/2020 05/24/2020 03/15/2020 02/02/2020 12/23/2019  Systolic BP 120 113 98 98 136 088 118  Diastolic BP 76 71 65 66 84 78 71  Wt. (Lbs) 196 195.6 196.4 189 188.2 185 192  BMI 31.64 31.57 31.7 30.51 30.38 29.86 30.99

## 2020-11-02 NOTE — Assessment & Plan Note (Addendum)
C BP/Weight 11/01/2020 09/13/2020 08/02/2020 05/24/2020 03/15/2020 02/02/2020 12/23/2019  Systolic BP 120 113 98 98 136 782 118  Diastolic BP 76 71 65 66 84 78 71  Wt. (Lbs) 196 195.6 196.4 189 188.2 185 192  BMI 31.64 31.57 31.7 30.51 30.38 29.86 30.99   Foot/eye exam completion dates Latest Ref Rng & Units 07/11/2018 07/03/2018  Eye Exam No Retinopathy No Retinopathy -  Foot Form Completion - - Done

## 2020-11-02 NOTE — Progress Notes (Signed)
Brittany Nunez     MRN: [id number]      DOB: September 04, 1960   HPI Brittany Nunez is here for follow up and re-evaluation of chronic medical conditions, medication management and review of any available recent lab and radiology data.  Preventive health is updated, specifically  Cancer screening and Immunization.   Questions or concerns regarding consultations or procedures which the PT has had in the interim are  addressed. The PT denies any adverse reactions to current medications since the last visit.  3 week h/o sinus pressure with increased drainage, and non productive cough, no fever or chills  No ear pain or sore throat, burning in her chest when she swallows  ROS . Denies chest pains, palpitations and leg swelling Denies abdominal pain, nausea, vomiting,diarrhea or constipation.   Denies dysuria, frequency, hesitancy or incontinence. Denies joint pain, swelling and limitation in mobility. Denies headaches, seizures, numbness, or tingling. Denies depression, anxiety or insomnia. Denies skin break down or rash.   PE  BP 120/76 (BP Location: Left Arm, Patient Position: Sitting, Cuff Size: Large)   Pulse (!) 108   Temp 98.6 F (37 C) (Temporal)   Ht 5\' 6"  (1.676 m)   Wt 196 lb (88.9 kg)   SpO2 97%   BMI 31.64 kg/m   Patient alert and oriented and in no cardiopulmonary distress.  HEENT: No facial asymmetry, EOMI,     Neck supple .Nasal congestion, no sinus nderness , TM clear  Chest: Clear to auscultation bilaterally.  CVS: S1, S2 no murmurs, no S3.Regular rate.  ABD: Soft non tender.   Ext: No edema  MS: Adequate ROM spine, shoulders, hips and knees.  Skin: Intact, no ulcerations or rash noted.  Psych: Good eye contact, normal affect. Memory intact not anxious or depressed appearing.  CNS: CN 2-12 intact, power,  normal throughout.no focal deficits noted.   Assessment & Plan  Type 2 diabetes mellitus with vascular disease [facility]) Brittany Nunez is reminded of the importance  of commitment to daily physical activity for 30 minutes or more, as able and the need to limit carbohydrate intake to 30 to 60 grams per meal to help with blood sugar control.   The need to take medication as prescribed, test blood sugar as directed, and to call between visits if there is a concern that blood sugar is uncontrolled is also discussed.   Brittany Nunez is reminded of the importance of daily foot exam, annual eye examination, and good blood sugar, blood pressure and cholesterol control.  Diabetic Labs Latest Ref Rng & Units 08/02/2020 03/15/2020 03/09/2020 03/09/2020 11/09/2019  HbA1c 4.8 - 5.6 % 6.7(H) 6.0(A) - - 6.6(A)  Microalbumin - - - - 5.2 -  Micro/Creat Ratio <30 mcg/mg creat - - - - -  Chol 100 - 199 mg/dL 11/11/2019 - 106 269 -  HDL 485 mg/dL 50 - 47 47 -  Calc LDL 0 - 99 mg/dL 68 - 55 55 -  Triglycerides 0 - 149 mg/dL 80 - 44 44 -  Creatinine 0.57 - 1.00 mg/dL >46) - 2.70(J 1.0 -   BP/Weight 11/01/2020 09/13/2020 08/02/2020 05/24/2020 03/15/2020 02/02/2020 12/23/2019  Systolic BP 120 113 98 98 136 12/25/2019 118  Diastolic BP 76 71 65 66 84 78 71  Wt. (Lbs) 196 195.6 196.4 189 188.2 185 192  BMI 31.64 31.57 31.7 30.51 30.38 29.86 30.99   Foot/eye exam completion dates Latest Ref Rng & Units 07/11/2018 07/03/2018  Eye Exam No Retinopathy No Retinopathy -  Foot Form Completion - - Done        Allergic sinusitis Currently uncontrolled start daily singulair  Allergic cough Currently uncontrolled, tessalon perle as needed  Type 2 diabetes mellitus with hyperglycemia (HCC) C BP/Weight 11/01/2020 09/13/2020 08/02/2020 05/24/2020 03/15/2020 02/02/2020 12/23/2019  Systolic BP 120 113 98 98 136 932 118  Diastolic BP 76 71 65 66 84 78 71  Wt. (Lbs) 196 195.6 196.4 189 188.2 185 192  BMI 31.64 31.57 31.7 30.51 30.38 29.86 30.99   Foot/eye exam completion dates Latest Ref Rng & Units 07/11/2018 07/03/2018  Eye Exam No Retinopathy No Retinopathy -  Foot Form Completion - - Done        Essential  hypertension Controlled, no change in medication DASH diet and commitment to daily physical activity for a minimum of 30 minutes discussed and encouraged, as a part of hypertension management. The importance of attaining a healthy weight is also discussed.  BP/Weight 11/01/2020 09/13/2020 08/02/2020 05/24/2020 03/15/2020 02/02/2020 12/23/2019  Systolic BP 120 113 98 98 136 671 118  Diastolic BP 76 71 65 66 84 78 71  Wt. (Lbs) 196 195.6 196.4 189 188.2 185 192  BMI 31.64 31.57 31.7 30.51 30.38 29.86 30.99       Hyperlipidemia associated with type 2 diabetes mellitus (HCC) Hyperlipidemia:Low fat diet discussed and encouraged.   Lipid Panel  Lab Results  Component Value Date   CHOL 134 08/02/2020   HDL 50 08/02/2020   LDLCALC 68 08/02/2020   TRIG 80 08/02/2020   CHOLHDL 2.7 08/02/2020     Controlled, no change in medication    Obesity (BMI 30.0-34.9)  Patient re-educated about  the importance of commitment to a  minimum of 150 minutes of exercise per week as able.  The importance of healthy food choices with portion control discussed, as well as eating regularly and within a 12 hour window most days. The need to choose "clean , green" food 50 to 75% of the time is discussed, as well as to make water the primary drink and set a goal of 64 ounces water daily.    Weight /BMI 11/01/2020 09/13/2020 08/02/2020  WEIGHT 196 lb 195 lb 9.6 oz 196 lb 6.4 oz  HEIGHT 5\' 6"  5\' 6"  5\' 6"   BMI 31.64 kg/m2 31.57 kg/m2 31.7 kg/m2

## 2020-11-02 NOTE — Assessment & Plan Note (Signed)
Currently uncontrolled, tessalon perle as needed

## 2020-11-02 NOTE — Assessment & Plan Note (Signed)
  Patient re-educated about  the importance of commitment to a  minimum of 150 minutes of exercise per week as able.  The importance of healthy food choices with portion control discussed, as well as eating regularly and within a 12 hour window most days. The need to choose "clean , green" food 50 to 75% of the time is discussed, as well as to make water the primary drink and set a goal of 64 ounces water daily.    Weight /BMI 11/01/2020 09/13/2020 08/02/2020  WEIGHT 196 lb 195 lb 9.6 oz 196 lb 6.4 oz  HEIGHT 5\' 6"  5\' 6"  5\' 6"   BMI 31.64 kg/m2 31.57 kg/m2 31.7 kg/m2

## 2020-11-03 LAB — MICROALBUMIN / CREATININE URINE RATIO
Creatinine, Urine: 88.4 mg/dL
Microalb/Creat Ratio: 6 mg/g creat (ref 0–29)
Microalbumin, Urine: 5.2 ug/mL

## 2020-11-07 ENCOUNTER — Other Ambulatory Visit: Payer: Self-pay | Admitting: Internal Medicine

## 2020-11-07 DIAGNOSIS — E785 Hyperlipidemia, unspecified: Secondary | ICD-10-CM

## 2020-11-15 ENCOUNTER — Other Ambulatory Visit: Payer: Self-pay | Admitting: Family Medicine

## 2020-11-17 ENCOUNTER — Other Ambulatory Visit: Payer: Self-pay | Admitting: Family Medicine

## 2020-11-23 ENCOUNTER — Other Ambulatory Visit: Payer: Self-pay | Admitting: "Endocrinology

## 2020-11-24 ENCOUNTER — Other Ambulatory Visit: Payer: Self-pay | Admitting: Family Medicine

## 2020-12-13 ENCOUNTER — Other Ambulatory Visit: Payer: Self-pay | Admitting: Family Medicine

## 2020-12-27 ENCOUNTER — Other Ambulatory Visit: Payer: Self-pay | Admitting: Family Medicine

## 2021-01-02 ENCOUNTER — Other Ambulatory Visit: Payer: Self-pay | Admitting: Family Medicine

## 2021-01-19 ENCOUNTER — Other Ambulatory Visit: Payer: Self-pay | Admitting: Family Medicine

## 2021-01-31 ENCOUNTER — Other Ambulatory Visit: Payer: Self-pay | Admitting: Family Medicine

## 2021-02-06 ENCOUNTER — Other Ambulatory Visit: Payer: Self-pay | Admitting: Family Medicine

## 2021-02-10 ENCOUNTER — Other Ambulatory Visit: Payer: Self-pay | Admitting: Family Medicine

## 2021-02-14 ENCOUNTER — Other Ambulatory Visit: Payer: Self-pay | Admitting: Internal Medicine

## 2021-02-14 DIAGNOSIS — E785 Hyperlipidemia, unspecified: Secondary | ICD-10-CM

## 2021-02-20 ENCOUNTER — Other Ambulatory Visit: Payer: Self-pay | Admitting: Family Medicine

## 2021-02-20 ENCOUNTER — Other Ambulatory Visit: Payer: Self-pay | Admitting: "Endocrinology

## 2021-03-01 ENCOUNTER — Encounter: Payer: BC Managed Care – PPO | Admitting: Family Medicine

## 2021-03-06 ENCOUNTER — Other Ambulatory Visit: Payer: Self-pay | Admitting: Family Medicine

## 2021-03-08 ENCOUNTER — Other Ambulatory Visit: Payer: Self-pay | Admitting: Family Medicine

## 2021-03-14 DIAGNOSIS — E1165 Type 2 diabetes mellitus with hyperglycemia: Secondary | ICD-10-CM | POA: Diagnosis not present

## 2021-03-15 LAB — COMPREHENSIVE METABOLIC PANEL
ALT: 12 IU/L (ref 0–32)
AST: 15 IU/L (ref 0–40)
Albumin/Globulin Ratio: 1.3 (ref 1.2–2.2)
Albumin: 3.9 g/dL (ref 3.8–4.8)
Alkaline Phosphatase: 86 IU/L (ref 44–121)
BUN/Creatinine Ratio: 13 (ref 12–28)
BUN: 13 mg/dL (ref 8–27)
Bilirubin Total: 0.2 mg/dL (ref 0.0–1.2)
CO2: 24 mmol/L (ref 20–29)
Calcium: 9.2 mg/dL (ref 8.7–10.3)
Chloride: 106 mmol/L (ref 96–106)
Creatinine, Ser: 0.97 mg/dL (ref 0.57–1.00)
Globulin, Total: 2.9 g/dL (ref 1.5–4.5)
Glucose: 98 mg/dL (ref 65–99)
Potassium: 4.1 mmol/L (ref 3.5–5.2)
Sodium: 144 mmol/L (ref 134–144)
Total Protein: 6.8 g/dL (ref 6.0–8.5)
eGFR: 66 mL/min/{1.73_m2} (ref >59)

## 2021-03-15 LAB — TSH: TSH: 1.23 u[IU]/mL (ref 0.450–4.500)

## 2021-03-15 LAB — LIPID PANEL
Chol/HDL Ratio: 2.5 ratio (ref 0.0–4.4)
Cholesterol, Total: 107 mg/dL (ref 100–199)
HDL: 42 mg/dL (ref >39)
LDL Chol Calc (NIH): 50 mg/dL (ref 0–99)
Triglycerides: 68 mg/dL (ref 0–149)
VLDL Cholesterol Cal: 15 mg/dL (ref 5–40)

## 2021-03-15 LAB — T4, FREE: Free T4: 0.98 ng/dL (ref 0.82–1.77)

## 2021-03-16 ENCOUNTER — Ambulatory Visit: Payer: BC Managed Care – PPO | Admitting: "Endocrinology

## 2021-03-27 ENCOUNTER — Other Ambulatory Visit: Payer: Self-pay | Admitting: Family Medicine

## 2021-04-03 ENCOUNTER — Other Ambulatory Visit: Payer: Self-pay | Admitting: Family Medicine

## 2021-04-11 ENCOUNTER — Other Ambulatory Visit: Payer: Self-pay | Admitting: Family Medicine

## 2021-04-12 ENCOUNTER — Telehealth: Payer: Self-pay | Admitting: Family Medicine

## 2021-04-12 NOTE — Telephone Encounter (Signed)
Pt called in for refills on Temazepam to [facility]

## 2021-04-12 NOTE — Telephone Encounter (Signed)
Please advise 

## 2021-04-13 ENCOUNTER — Telehealth: Payer: Self-pay

## 2021-04-13 NOTE — Telephone Encounter (Signed)
Needs appt within 1 week. Will get refill at that time. Pls schedule

## 2021-04-13 NOTE — Telephone Encounter (Signed)
Patient called need med refill  temazepam (RESTORIL) 30 MG capsule  Pharmacy: [facility]

## 2021-04-13 NOTE — Telephone Encounter (Signed)
Med denied- needs refill per dr

## 2021-04-13 NOTE — Telephone Encounter (Signed)
Denied- needs APPT per dr

## 2021-04-14 ENCOUNTER — Ambulatory Visit: Payer: BC Managed Care – PPO | Admitting: Family Medicine

## 2021-04-14 NOTE — Telephone Encounter (Signed)
Appointment scheduled for 10.21.2022 at 3:00 pm and mychart message sent.

## 2021-04-20 ENCOUNTER — Encounter: Payer: Self-pay | Admitting: Family Medicine

## 2021-04-20 ENCOUNTER — Ambulatory Visit (INDEPENDENT_AMBULATORY_CARE_PROVIDER_SITE_OTHER): Payer: BC Managed Care – PPO | Admitting: Family Medicine

## 2021-04-20 ENCOUNTER — Other Ambulatory Visit: Payer: Self-pay

## 2021-04-20 VITALS — BP 101/68 | HR 112 | Resp 18 | Ht 65.0 in | Wt 199.1 lb

## 2021-04-20 DIAGNOSIS — E669 Obesity, unspecified: Secondary | ICD-10-CM

## 2021-04-20 DIAGNOSIS — I10 Essential (primary) hypertension: Secondary | ICD-10-CM | POA: Diagnosis not present

## 2021-04-20 DIAGNOSIS — E1159 Type 2 diabetes mellitus with other circulatory complications: Secondary | ICD-10-CM

## 2021-04-20 DIAGNOSIS — E1169 Type 2 diabetes mellitus with other specified complication: Secondary | ICD-10-CM

## 2021-04-20 DIAGNOSIS — F321 Major depressive disorder, single episode, moderate: Secondary | ICD-10-CM | POA: Diagnosis not present

## 2021-04-20 DIAGNOSIS — Z23 Encounter for immunization: Secondary | ICD-10-CM

## 2021-04-20 DIAGNOSIS — J309 Allergic rhinitis, unspecified: Secondary | ICD-10-CM | POA: Diagnosis not present

## 2021-04-20 DIAGNOSIS — K219 Gastro-esophageal reflux disease without esophagitis: Secondary | ICD-10-CM

## 2021-04-20 DIAGNOSIS — E785 Hyperlipidemia, unspecified: Secondary | ICD-10-CM

## 2021-04-20 MED ORDER — AZELASTINE HCL 0.1 % NA SOLN: 2.0000 | Freq: Two times a day (BID) | NASAL | 12 refills | Status: DC

## 2021-04-20 MED ORDER — GABAPENTIN 800 MG PO TABS
800.0000 mg | ORAL_TABLET | Freq: Three times a day (TID) | ORAL | 5 refills | Status: DC
Start: 2021-04-20 — End: 2022-02-08

## 2021-04-20 MED ORDER — VENLAFAXINE HCL ER 75 MG PO CP24: 75.0000 mg | ORAL_CAPSULE | Freq: Every day | ORAL | 1 refills | Status: DC

## 2021-04-20 MED ORDER — FLUTICASONE PROPIONATE 50 MCG/ACT NA SUSP: 2.0000 | Freq: Every day | NASAL | 5 refills | Status: DC

## 2021-04-20 MED ORDER — ROSUVASTATIN CALCIUM 5 MG PO TABS: 5.0000 mg | ORAL_TABLET | Freq: Every day | ORAL | 3 refills | Status: DC

## 2021-04-20 MED ORDER — TEMAZEPAM 30 MG PO CAPS: 30.0000 mg | ORAL_CAPSULE | Freq: Every evening | ORAL | 1 refills | Status: DC | PRN

## 2021-04-20 MED ORDER — BACLOFEN 10 MG PO TABS: 10.0000 mg | ORAL_TABLET | Freq: Three times a day (TID) | ORAL | 5 refills | Status: DC

## 2021-04-20 MED ORDER — AMLODIPINE BESYLATE 5 MG PO TABS: 5.0000 mg | ORAL_TABLET | Freq: Every day | ORAL | 3 refills | Status: DC

## 2021-04-20 NOTE — Patient Instructions (Addendum)
Annual exam in 6 months with MD, call if you need me sooner  Flu vaccine today  Pls obtain and document shingrix vaccine #2 at [facility]  Need covid vaccine get as soon as possible  New for allergies is daily Astelin and flonase  HBa1C today Please schedule mammogram at checkout, overdue!! ( Early morning preferred)  Please sched diabetic eye screen in the office, past due  Examine  feet daily as you have reduced feeling in the feet, make sure no cut or infection  ,It is important that you exercise regularly at least 30 minutes 5 times a week. If you develop chest pain, have severe difficulty breathing, or feel very tired, stop exercising immediately and seek medical attention   Think about what you will eat, plan ahead. Choose " clean, green, fresh or frozen" over canned, processed or packaged foods which are more sugary, salty and fatty. 70 to 75% of food eaten should be vegetables and fruit. Three meals at set times with snacks allowed between meals, but they must be fruit or vegetables. Aim to eat over a 12 hour period , example 7 am to 7 pm, and STOP after  your last meal of the day. Drink water,generally about 64 ounces per day, no other drink is as healthy. Fruit juice is best enjoyed in a healthy way, by EATING the fruit.   Thanks for choosing [facility], we consider it a privelige to serve you.

## 2021-04-21 LAB — HEMOGLOBIN A1C
Est. average glucose Bld gHb Est-mCnc: 140 mg/dL
Hgb A1c MFr Bld: 6.5 % — ABNORMAL HIGH (ref 4.8–5.6)

## 2021-04-23 ENCOUNTER — Encounter: Payer: Self-pay | Admitting: Family Medicine

## 2021-04-23 NOTE — Assessment & Plan Note (Signed)
Increased and uncontrolled symptoms, medication prescribed and advised daily use

## 2021-04-23 NOTE — Assessment & Plan Note (Signed)
  Patient re-educated about  the importance of commitment to a  minimum of 150 minutes of exercise per week as able.  The importance of healthy food choices with portion control discussed, as well as eating regularly and within a 12 hour window most days. The need to choose "clean , green" food 50 to 75% of the time is discussed, as well as to make water the primary drink and set a goal of 64 ounces water daily.    Weight /BMI 04/20/2021 11/01/2020 09/13/2020  WEIGHT 199 lb 1.9 oz 196 lb 195 lb 9.6 oz  HEIGHT 5\' 5"  5\' 6"  5\' 6"   BMI 33.14 kg/m2 31.64 kg/m2 31.57 kg/m2

## 2021-04-23 NOTE — Assessment & Plan Note (Signed)
Controlled, no change in medication  

## 2021-04-23 NOTE — Assessment & Plan Note (Signed)
Controlled, no change in medication DASH diet and commitment to daily physical activity for a minimum of 30 minutes discussed and encouraged, as a part of hypertension management. The importance of attaining a healthy weight is also discussed.  BP/Weight 04/20/2021 11/01/2020 09/13/2020 08/02/2020 05/24/2020 03/15/2020 02/02/2020  Systolic BP 101 120 113 98 98 887 119  Diastolic BP 68 76 71 65 66 84 78  Wt. (Lbs) 199.12 196 195.6 196.4 189 188.2 185  BMI 33.14 31.64 31.57 31.7 30.51 30.38 29.86

## 2021-04-23 NOTE — Progress Notes (Signed)
Brittany Nunez     MRN: [id number]      DOB: June 07, 1961   HPI Brittany Nunez is here for follow up and re-evaluation of chronic medical conditions, medication management and review of any available recent lab and radiology data.  Preventive health is updated, specifically  Cancer screening and Immunization.   Questions or concerns regarding consultations or procedures which the PT has had in the interim are  addressed. The PT denies any adverse reactions to current medications since the last visit.  C/o uncontrolled allergy ROS Denies recent fever or chills. Denies sinus pressure, nasal congestion, ear pain or sore throat. Denies chest congestion, productive cough or wheezing. Denies chest pains, palpitations and leg swelling Denies abdominal pain, nausea, vomiting,diarrhea or constipation.   Denies dysuria, frequency, hesitancy or incontinence. Denies joint pain, swelling and limitation in mobility. Denies headaches, seizures, numbness, or tingling. Denies depression, anxiety or insomnia. Denies skin break down or rash.   PE  BP 101/68   Pulse (!) 112   Resp 18   Ht 5\' 5"  (1.651 m)   Wt 199 lb 1.9 oz (90.3 kg)   SpO2 95%   BMI 33.14 kg/m   Patient alert and oriented and in no cardiopulmonary distress.  HEENT: No facial asymmetry, EOMI,     Neck supple .  Chest: Clear to auscultation bilaterally.  CVS: S1, S2 no murmurs, no S3.Regular rate.  ABD: Soft non tender.   Ext: No edema  MS: Adequate ROM spine, shoulders, hips and knees.  Skin: Intact, no ulcerations or rash noted.  Psych: Good eye contact, normal affect. Memory intact not anxious or depressed appearing.  CNS: CN 2-12 intact, power,  normal throughout.no focal deficits noted.   Assessment & Plan  Essential hypertension Controlled, no change in medication DASH diet and commitment to daily physical activity for a minimum of 30 minutes discussed and encouraged, as a part of hypertension management. The  importance of attaining a healthy weight is also discussed.  BP/Weight 04/20/2021 11/01/2020 09/13/2020 08/02/2020 05/24/2020 03/15/2020 02/02/2020  Systolic BP 101 120 113 98 98 04/03/2020 119  Diastolic BP 68 76 71 65 66 84 78  Wt. (Lbs) 199.12 196 195.6 196.4 189 188.2 185  BMI 33.14 31.64 31.57 31.7 30.51 30.38 29.86       Allergic sinusitis Increased and uncontrolled symptoms, medication prescribed and advised daily use  Hyperlipidemia associated with type 2 diabetes mellitus (HCC) Hyperlipidemia:Low fat diet discussed and encouraged.   Lipid Panel  Lab Results  Component Value Date   CHOL 107 03/14/2021   HDL 42 03/14/2021   LDLCALC 50 03/14/2021   TRIG 68 03/14/2021   CHOLHDL 2.5 03/14/2021     Controlled, no change in medication   GERD (gastroesophageal reflux disease) Controlled, no change in medication   Depression Controlled, no change in medication   Obesity (BMI 30.0-34.9)  Patient re-educated about  the importance of commitment to a  minimum of 150 minutes of exercise per week as able.  The importance of healthy food choices with portion control discussed, as well as eating regularly and within a 12 hour window most days. The need to choose "clean , green" food 50 to 75% of the time is discussed, as well as to make water the primary drink and set a goal of 64 ounces water daily.    Weight /BMI 04/20/2021 11/01/2020 09/13/2020  WEIGHT 199 lb 1.9 oz 196 lb 195 lb 9.6 oz  HEIGHT 5\' 5"  5\' 6"  5\' 6"   BMI 33.14 kg/m2 31.64 kg/m2 31.57 kg/m2      Type 2 diabetes mellitus with vascular disease (HCC) Brittany Nunez is reminded of the importance of commitment to daily physical activity for 30 minutes or more, as able and the need to limit carbohydrate intake to 30 to 60 grams per meal to help with blood sugar control.   The need to take medication as prescribed, test blood sugar as directed, and to call between visits if there is a concern that blood sugar is uncontrolled is  also discussed.   Brittany Nunez is reminded of the importance of daily foot exam, annual eye examination, and good blood sugar, blood pressure and cholesterol control.  Diabetic Labs Latest Ref Rng & Units 04/20/2021 03/14/2021 11/01/2020 08/02/2020 03/15/2020  HbA1c 4.8 - 5.6 % 6.5(H) - - 6.7(H) 6.0(A)  Microalbumin - - - - - -  Micro/Creat Ratio 0 - 29 mg/g creat - - 6 - -  Chol 100 - 199 mg/dL - 660 - 630 -  HDL >16 mg/dL - 42 - 50 -  Calc LDL 0 - 99 mg/dL - 50 - 68 -  Triglycerides 0 - 149 mg/dL - 68 - 80 -  Creatinine 0.57 - 1.00 mg/dL - 0.10 - 9.32(T) -   BP/Weight 04/20/2021 11/01/2020 09/13/2020 08/02/2020 05/24/2020 03/15/2020 02/02/2020  Systolic BP 101 120 113 98 98 557 119  Diastolic BP 68 76 71 65 66 84 78  Wt. (Lbs) 199.12 196 195.6 196.4 189 188.2 185  BMI 33.14 31.64 31.57 31.7 30.51 30.38 29.86   Foot/eye exam completion dates Latest Ref Rng & Units 07/11/2018 07/03/2018  Eye Exam No Retinopathy No Retinopathy -  Foot Form Completion - - Done      Controlled, no change in medication Managed by endo

## 2021-04-23 NOTE — Assessment & Plan Note (Signed)
Brittany Nunez is reminded of the importance of commitment to daily physical activity for 30 minutes or more, as able and the need to limit carbohydrate intake to 30 to 60 grams per meal to help with blood sugar control.   The need to take medication as prescribed, test blood sugar as directed, and to call between visits if there is a concern that blood sugar is uncontrolled is also discussed.   Brittany Nunez is reminded of the importance of daily foot exam, annual eye examination, and good blood sugar, blood pressure and cholesterol control.  Diabetic Labs Latest Ref Rng & Units 04/20/2021 03/14/2021 11/01/2020 08/02/2020 03/15/2020  HbA1c 4.8 - 5.6 % 6.5(H) - - 6.7(H) 6.0(A)  Microalbumin - - - - - -  Micro/Creat Ratio 0 - 29 mg/g creat - - 6 - -  Chol 100 - 199 mg/dL - 235 - 573 -  HDL >22 mg/dL - 42 - 50 -  Calc LDL 0 - 99 mg/dL - 50 - 68 -  Triglycerides 0 - 149 mg/dL - 68 - 80 -  Creatinine 0.57 - 1.00 mg/dL - 0.25 - 4.27(C) -   BP/Weight 04/20/2021 11/01/2020 09/13/2020 08/02/2020 05/24/2020 03/15/2020 02/02/2020  Systolic BP 101 120 113 98 98 623 119  Diastolic BP 68 76 71 65 66 84 78  Wt. (Lbs) 199.12 196 195.6 196.4 189 188.2 185  BMI 33.14 31.64 31.57 31.7 30.51 30.38 29.86   Foot/eye exam completion dates Latest Ref Rng & Units 07/11/2018 07/03/2018  Eye Exam No Retinopathy No Retinopathy -  Foot Form Completion - - Done      Controlled, no change in medication Managed by endo

## 2021-04-23 NOTE — Assessment & Plan Note (Signed)
Hyperlipidemia:Low fat diet discussed and encouraged.   Lipid Panel  Lab Results  Component Value Date   CHOL 107 03/14/2021   HDL 42 03/14/2021   LDLCALC 50 03/14/2021   TRIG 68 03/14/2021   CHOLHDL 2.5 03/14/2021     Controlled, no change in medication

## 2021-05-16 ENCOUNTER — Other Ambulatory Visit: Payer: Self-pay | Admitting: "Endocrinology

## 2021-05-22 ENCOUNTER — Other Ambulatory Visit: Payer: Self-pay | Admitting: "Endocrinology

## 2021-05-24 ENCOUNTER — Telehealth: Payer: Self-pay | Admitting: Family Medicine

## 2021-05-24 ENCOUNTER — Telehealth: Payer: Self-pay | Admitting: "Endocrinology

## 2021-05-24 NOTE — Telephone Encounter (Signed)
ERROR

## 2021-05-24 NOTE — Telephone Encounter (Signed)
Patient is requesting a refill but medication was a denied by nurse due to no appointment. Patient had blood work in September, do you want to use those for an appt or have patient repeat labs?

## 2021-05-30 ENCOUNTER — Ambulatory Visit (INDEPENDENT_AMBULATORY_CARE_PROVIDER_SITE_OTHER): Payer: BC Managed Care – PPO | Admitting: "Endocrinology

## 2021-05-30 ENCOUNTER — Other Ambulatory Visit: Payer: Self-pay

## 2021-05-30 ENCOUNTER — Encounter: Payer: Self-pay | Admitting: "Endocrinology

## 2021-05-30 VITALS — BP 134/84 | HR 72 | Ht 65.0 in | Wt 203.6 lb

## 2021-05-30 DIAGNOSIS — E559 Vitamin D deficiency, unspecified: Secondary | ICD-10-CM | POA: Diagnosis not present

## 2021-05-30 DIAGNOSIS — E782 Mixed hyperlipidemia: Secondary | ICD-10-CM

## 2021-05-30 DIAGNOSIS — E1165 Type 2 diabetes mellitus with hyperglycemia: Secondary | ICD-10-CM | POA: Diagnosis not present

## 2021-05-30 MED ORDER — JANUMET XR 50-500 MG PO TB24: ORAL_TABLET | ORAL | 1 refills | Status: DC
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
## 2021-05-30 NOTE — Patient Instructions (Signed)

## 2021-08-18 ENCOUNTER — Ambulatory Visit (INDEPENDENT_AMBULATORY_CARE_PROVIDER_SITE_OTHER): Payer: BC Managed Care – PPO | Admitting: Family Medicine

## 2021-08-18 ENCOUNTER — Encounter: Payer: Self-pay | Admitting: Family Medicine

## 2021-08-18 ENCOUNTER — Other Ambulatory Visit: Payer: Self-pay

## 2021-08-18 VITALS — BP 124/84 | HR 93 | Ht 66.0 in | Wt 199.4 lb

## 2021-08-18 DIAGNOSIS — E669 Obesity, unspecified: Secondary | ICD-10-CM

## 2021-08-18 DIAGNOSIS — E785 Hyperlipidemia, unspecified: Secondary | ICD-10-CM | POA: Diagnosis not present

## 2021-08-18 DIAGNOSIS — I10 Essential (primary) hypertension: Secondary | ICD-10-CM

## 2021-08-18 DIAGNOSIS — E559 Vitamin D deficiency, unspecified: Secondary | ICD-10-CM

## 2021-08-18 DIAGNOSIS — E66811 Obesity, class 1: Secondary | ICD-10-CM

## 2021-08-18 DIAGNOSIS — M62838 Other muscle spasm: Secondary | ICD-10-CM | POA: Diagnosis not present

## 2021-08-18 DIAGNOSIS — E1169 Type 2 diabetes mellitus with other specified complication: Secondary | ICD-10-CM | POA: Diagnosis not present

## 2021-08-18 DIAGNOSIS — Z1211 Encounter for screening for malignant neoplasm of colon: Secondary | ICD-10-CM

## 2021-08-18 DIAGNOSIS — E1159 Type 2 diabetes mellitus with other circulatory complications: Secondary | ICD-10-CM

## 2021-08-18 MED ORDER — BACLOFEN 20 MG PO TABS: 20.0000 mg | ORAL_TABLET | Freq: Three times a day (TID) | ORAL | 1 refills | Status: DC

## 2021-08-18 NOTE — Progress Notes (Signed)
Brittany Nunez     MRN: [id number]      DOB: 1960/10/20   HPI Brittany Nunez is here yesterday arounfd 4 pm started exeperiencing severe muscle spasms in both hands and both LE including feet No other concerns Denies polyuria, polydipsia, blurred vision , or hypoglycemic episodes. Reports good blood sugar control  ROS Denies recent fever or chills. Denies sinus pressure, nasal congestion, ear pain or sore throat. Denies chest congestion, productive cough or wheezing. Denies chest pains, palpitations and leg swelling Denies abdominal pain, nausea, vomiting,diarrhea or constipation.   Denies dysuria, frequency, hesitancy or incontinence. . Denies depression, anxiety or insomnia. Denies skin break down or rash.   PE    Patient alert and oriented and in no cardiopulmonary distress.  HEENT: No facial asymmetry, EOMI,     Neck supple .  Chest: Clear to auscultation bilaterally.  CVS: S1, S2 no murmurs, no S3.Regular rate.  ABD: Soft non tender.   Ext: No edema  MS: Adequate ROM spine, shoulders, hips and knees.  Skin: Intact, no ulcerations or rash noted.  Psych: Good eye contact, normal affect. Memory intact not anxious or depressed appearing.  CNS: CN 2-12 intact, power,  normal throughout.no focal deficits noted.   Assessment & Plan  Muscle spasms of both lower extremities Recent increase in frequency and severity, increase dose of baclofen and trial of quinine  Type 2 diabetes mellitus with vascular disease (HCC) Controlled, no change in medication Brittany Nunez is reminded of the importance of commitment to daily physical activity for 30 minutes or more, as able and the need to limit carbohydrate intake to 30 to 60 grams per meal to help with blood sugar control.   The need to take medication as prescribed, test blood sugar as directed, and to call between visits if there is a concern that blood sugar is uncontrolled is also discussed.   Brittany Nunez is reminded of the  importance of daily foot exam, annual eye examination, and good blood sugar, blood pressure and cholesterol control.  Diabetic Labs Latest Ref Rng & Units 08/18/2021 04/20/2021 03/14/2021 11/01/2020 08/02/2020  HbA1c 4.8 - 5.6 % - 6.5(H) - - 6.7(H)  Microalbumin - - - - - -  Micro/Creat Ratio 0 - 29 mg/g creat - - - 6 -  Chol 100 - 199 mg/dL - - 453 - 646  HDL >80 mg/dL - - 42 - 50  Calc LDL 0 - 99 mg/dL - - 50 - 68  Triglycerides 0 - 149 mg/dL - - 68 - 80  Creatinine 0.57 - 1.00 mg/dL 3.21 - 2.24 - 8.25(O)   BP/Weight 08/18/2021 05/30/2021 04/20/2021 11/01/2020 09/13/2020 08/02/2020 05/24/2020  Systolic BP 124 134 101 120 113 98 98  Diastolic BP 84 84 68 76 71 65 66  Wt. (Lbs) 199.4 203.6 199.12 196 195.6 196.4 189  BMI 32.18 33.88 33.14 31.64 31.57 31.7 30.51   Foot/eye exam completion dates Latest Ref Rng & Units 07/11/2018 07/03/2018  Eye Exam No Retinopathy No Retinopathy -  Foot Form Completion - - Done        Obesity (BMI 30.0-34.9)  Patient re-educated about  the importance of commitment to a  minimum of 150 minutes of exercise per week as able.  The importance of healthy food choices with portion control discussed, as well as eating regularly and within a 12 hour window most days. The need to choose "clean , green" food 50 to 75% of the time is discussed, as well  as to make water the primary drink and set a goal of 64 ounces water daily.    Weight /BMI 08/18/2021 05/30/2021 04/20/2021  WEIGHT 199 lb 6.4 oz 203 lb 9.6 oz 199 lb 1.9 oz  HEIGHT 5\' 6"  5\' 5"  5\' 5"   BMI 32.18 kg/m2 33.88 kg/m2 33.14 kg/m2      Hyperlipidemia associated with type 2 diabetes mellitus (HCC) Hyperlipidemia:Low fat diet discussed and encouraged.   Lipid Panel  Lab Results  Component Value Date   CHOL 107 03/14/2021   HDL 42 03/14/2021   LDLCALC 50 03/14/2021   TRIG 68 03/14/2021   CHOLHDL 2.5 03/14/2021   Controlled, no change in medication Updated lab needed at/ before next  visit.     Essential hypertension DASH diet and commitment to daily physical activity for a minimum of 30 minutes discussed and encouraged, as a part of hypertension management. The importance of attaining a healthy weight is also discussed.  BP/Weight 08/18/2021 05/30/2021 04/20/2021 11/01/2020 09/13/2020 08/02/2020 05/24/2020  Systolic BP 124 134 101 120 113 98 98  Diastolic BP 84 84 68 76 71 65 66  Wt. (Lbs) 199.4 203.6 199.12 196 195.6 196.4 189  BMI 32.18 33.88 33.14 31.64 31.57 31.7 30.51     Controlled, no change in medication

## 2021-08-18 NOTE — Patient Instructions (Addendum)
F/u as before, call if you need me sooner  New higher dose of baclofen is 20 mg one three time daily  Take 2 ounces tonic water if you  have spasm to see if this stops it  Labs today, cmp and EGFR, Magnesium and vit D level  You are referred for colonoscopy  Thanks for choosing [facility], we consider it a privelige to serve you.

## 2021-08-20 LAB — CMP14+EGFR
ALT: 17 IU/L (ref 0–32)
AST: 21 IU/L (ref 0–40)
Albumin/Globulin Ratio: 1.6 (ref 1.2–2.2)
Albumin: 4.2 g/dL (ref 3.8–4.8)
Alkaline Phosphatase: 96 IU/L (ref 44–121)
BUN/Creatinine Ratio: 16 (ref 12–28)
BUN: 16 mg/dL (ref 8–27)
Bilirubin Total: <0.2 mg/dL (ref 0.0–1.2)
CO2: 25 mmol/L (ref 20–29)
Calcium: 9 mg/dL (ref 8.7–10.3)
Chloride: 108 mmol/L — ABNORMAL HIGH (ref 96–106)
Creatinine, Ser: 0.99 mg/dL (ref 0.57–1.00)
Globulin, Total: 2.7 g/dL (ref 1.5–4.5)
Glucose: 112 mg/dL — ABNORMAL HIGH (ref 70–99)
Potassium: 4.8 mmol/L (ref 3.5–5.2)
Sodium: 146 mmol/L — ABNORMAL HIGH (ref 134–144)
Total Protein: 6.9 g/dL (ref 6.0–8.5)
eGFR: 64 mL/min/{1.73_m2} (ref >59)

## 2021-08-20 LAB — MAGNESIUM: Magnesium: 2 mg/dL (ref 1.6–2.3)

## 2021-08-20 LAB — VITAMIN D 25 HYDROXY (VIT D DEFICIENCY, FRACTURES): Vit D, 25-Hydroxy: 35.5 ng/mL (ref 30.0–100.0)

## 2021-08-21 ENCOUNTER — Encounter: Payer: Self-pay | Admitting: Family Medicine

## 2021-08-21 DIAGNOSIS — M62838 Other muscle spasm: Secondary | ICD-10-CM | POA: Insufficient documentation

## 2021-08-21 NOTE — Assessment & Plan Note (Signed)
°  Patient re-educated about  the importance of commitment to a  minimum of 150 minutes of exercise per week as able.  The importance of healthy food choices with portion control discussed, as well as eating regularly and within a 12 hour window most days. The need to choose "clean , green" food 50 to 75% of the time is discussed, as well as to make water the primary drink and set a goal of 64 ounces water daily.    Weight /BMI 08/18/2021 05/30/2021 04/20/2021  WEIGHT 199 lb 6.4 oz 203 lb 9.6 oz 199 lb 1.9 oz  HEIGHT 5\' 6"  5\' 5"  5\' 5"   BMI 32.18 kg/m2 33.88 kg/m2 33.14 kg/m2

## 2021-08-21 NOTE — Assessment & Plan Note (Signed)
Recent increase in frequency and severity, increase dose of baclofen and trial of quinine

## 2021-08-21 NOTE — Assessment & Plan Note (Signed)
DASH diet and commitment to daily physical activity for a minimum of 30 minutes discussed and encouraged, as a part of hypertension management. The importance of attaining a healthy weight is also discussed.  BP/Weight 08/18/2021 05/30/2021 04/20/2021 11/01/2020 09/13/2020 08/02/2020 05/24/2020  Systolic BP 124 134 101 120 113 98 98  Diastolic BP 84 84 68 76 71 65 66  Wt. (Lbs) 199.4 203.6 199.12 196 195.6 196.4 189  BMI 32.18 33.88 33.14 31.64 31.57 31.7 30.51     Controlled, no change in medication

## 2021-08-21 NOTE — Assessment & Plan Note (Signed)
Controlled, no change in medication Brittany Nunez is reminded of the importance of commitment to daily physical activity for 30 minutes or more, as able and the need to limit carbohydrate intake to 30 to 60 grams per meal to help with blood sugar control.   The need to take medication as prescribed, test blood sugar as directed, and to call between visits if there is a concern that blood sugar is uncontrolled is also discussed.   Brittany Nunez is reminded of the importance of daily foot exam, annual eye examination, and good blood sugar, blood pressure and cholesterol control.  Diabetic Labs Latest Ref Rng & Units 08/18/2021 04/20/2021 03/14/2021 11/01/2020 08/02/2020  HbA1c 4.8 - 5.6 % - 6.5(H) - - 6.7(H)  Microalbumin - - - - - -  Micro/Creat Ratio 0 - 29 mg/g creat - - - 6 -  Chol 100 - 199 mg/dL - - 967 - 591  HDL >63 mg/dL - - 42 - 50  Calc LDL 0 - 99 mg/dL - - 50 - 68  Triglycerides 0 - 149 mg/dL - - 68 - 80  Creatinine 0.57 - 1.00 mg/dL 8.46 - 6.59 - 9.35(T)   BP/Weight 08/18/2021 05/30/2021 04/20/2021 11/01/2020 09/13/2020 08/02/2020 05/24/2020  Systolic BP 124 134 101 120 113 98 98  Diastolic BP 84 84 68 76 71 65 66  Wt. (Lbs) 199.4 203.6 199.12 196 195.6 196.4 189  BMI 32.18 33.88 33.14 31.64 31.57 31.7 30.51   Foot/eye exam completion dates Latest Ref Rng & Units 07/11/2018 07/03/2018  Eye Exam No Retinopathy No Retinopathy -  Foot Form Completion - - Done

## 2021-08-21 NOTE — Assessment & Plan Note (Signed)
Hyperlipidemia:Low fat diet discussed and encouraged.   Lipid Panel  Lab Results  Component Value Date   CHOL 107 03/14/2021   HDL 42 03/14/2021   LDLCALC 50 03/14/2021   TRIG 68 03/14/2021   CHOLHDL 2.5 03/14/2021   Controlled, no change in medication Updated lab needed at/ before next visit.

## 2021-08-25 ENCOUNTER — Encounter: Payer: Self-pay | Admitting: Family Medicine

## 2021-10-06 DIAGNOSIS — Z1211 Encounter for screening for malignant neoplasm of colon: Secondary | ICD-10-CM | POA: Diagnosis not present

## 2021-10-15 LAB — COLOGUARD: COLOGUARD: NEGATIVE

## 2021-10-19 ENCOUNTER — Encounter: Payer: Self-pay | Admitting: Family Medicine

## 2021-10-19 ENCOUNTER — Ambulatory Visit (INDEPENDENT_AMBULATORY_CARE_PROVIDER_SITE_OTHER): Payer: BC Managed Care – PPO | Admitting: Family Medicine

## 2021-10-19 ENCOUNTER — Other Ambulatory Visit (HOSPITAL_COMMUNITY)
Admission: RE | Admit: 2021-10-19 | Discharge: 2021-10-19 | Disposition: A | Discharge status: Home / Self Care | Payer: BC Managed Care – PPO | Source: Ambulatory Visit | Attending: Infectious Diseases | Admitting: Infectious Diseases

## 2021-10-19 VITALS — BP 130/81 | HR 97 | Resp 16 | Ht 66.0 in | Wt 205.0 lb

## 2021-10-19 DIAGNOSIS — M5441 Lumbago with sciatica, right side: Secondary | ICD-10-CM | POA: Diagnosis not present

## 2021-10-19 DIAGNOSIS — E118 Type 2 diabetes mellitus with unspecified complications: Secondary | ICD-10-CM | POA: Diagnosis not present

## 2021-10-19 DIAGNOSIS — Z124 Encounter for screening for malignant neoplasm of cervix: Secondary | ICD-10-CM | POA: Diagnosis not present

## 2021-10-19 DIAGNOSIS — Z01419 Encounter for gynecological examination (general) (routine) without abnormal findings: Secondary | ICD-10-CM | POA: Diagnosis not present

## 2021-10-19 DIAGNOSIS — Z1151 Encounter for screening for human papillomavirus (HPV): Secondary | ICD-10-CM | POA: Diagnosis not present

## 2021-10-19 DIAGNOSIS — G8929 Other chronic pain: Secondary | ICD-10-CM

## 2021-10-19 DIAGNOSIS — Z1231 Encounter for screening mammogram for malignant neoplasm of breast: Secondary | ICD-10-CM

## 2021-10-19 DIAGNOSIS — M5442 Lumbago with sciatica, left side: Secondary | ICD-10-CM | POA: Diagnosis not present

## 2021-10-19 DIAGNOSIS — Z0001 Encounter for general adult medical examination with abnormal findings: Secondary | ICD-10-CM

## 2021-10-19 DIAGNOSIS — Z0000 Encounter for general adult medical examination without abnormal findings: Secondary | ICD-10-CM

## 2021-10-19 MED ORDER — METHYLPREDNISOLONE ACETATE 80 MG/ML IJ SUSP
80.0000 mg | Freq: Once | INTRAMUSCULAR | Status: AC
  Administered 2021-10-19: 80 mg via INTRAMUSCULAR

## 2021-10-19 MED ORDER — KETOROLAC TROMETHAMINE 60 MG/2ML IM SOLN
60.0000 mg | Freq: Once | INTRAMUSCULAR | Status: AC
  Administered 2021-10-19: 60 mg via INTRAMUSCULAR

## 2021-10-19 MED ORDER — PREDNISONE 5 MG PO TABS: 5.0000 mg | ORAL_TABLET | Freq: Two times a day (BID) | ORAL | 0 refills | Status: AC

## 2021-10-19 MED ORDER — MAGNESIUM 30 MG PO TABS: ORAL_TABLET | ORAL | 5 refills | Status: AC

## 2021-10-19 NOTE — Assessment & Plan Note (Addendum)
Annual exam as documented. . Immunization and cancer screening needs are specifically addressed at this visit.  

## 2021-10-19 NOTE — Patient Instructions (Addendum)
F/U in 5 months, call if you need me sooner ? ?Please schedule early  morning mammogram appointment at checkout ? ?You are referred for eye exam, vital that you get this ? ?Fasting lipid, HBA1C, cBCand microalb  May 11 or shhortly after ? ?Nurse please [facility] ? ? ?Toradol 60 mg IM and depo Medrol 80 mg iM in office tofay for back pain and 5 day course of prednisone is also prescribed ? ?Take magnesium 30 mg once daily for cramps and do stretching exercises before you go to bed  ? ?It is important that you exercise regularly at least 30 minutes 5 times a week. If you develop chest pain, have severe difficulty breathing, or feel very tired, stop exercising immediately and seek medical attention  ? ?Think about what you will eat, plan ahead. ?Choose " clean, green, fresh or frozen" over canned, processed or packaged foods which are more sugary, salty and fatty. ?70 to 75% of food eaten should be vegetables and fruit. ?Three meals at set times with snacks allowed between meals, but they must be fruit or vegetables. ?Aim to eat over a 12 hour period , example 7 am to 7 pm, and STOP after  your last meal of the day. ?Drink water,generally about 64 ounces per day, no other drink is as healthy. Fruit juice is best enjoyed in a healthy way, by EATING the fruit. ?Thanks for choosing [facility], we consider it a privelige to serve you. ? ?

## 2021-10-19 NOTE — Assessment & Plan Note (Signed)
Uncontrolled.Toradol and depo medrol administered IM in the office , to be followed by a short course of oral prednisone   

## 2021-10-19 NOTE — Progress Notes (Signed)
? ? ?  Brittany Nunez     MRN: [id number]      DOB: Oct 26, 1960 ? ?HPI: ?Patient is in for annual physical exam. ?Increased and uncontrolled back pain rated a 9 x 2 weeks, no specific aggravating factor, disturbs sleep, no new lower extremity weakness, numbness or incontinence ?C/o spasms, some relief, wants magnesium supplement if indicated to see if this gives additional relief, tonic water usefl ?Recent labs,  are reviewed. ?Immunization is reviewed , and  updated if needed. ? ? ?PE: ?BP 130/81   Pulse 97   Resp 16   Ht 5\' 6"  (1.676 m)   Wt 205 lb (93 kg)   SpO2 95%   BMI 33.09 kg/m?  ? ?Pleasant  female, alert and oriented x 3, in no cardio-pulmonary distress. ?Afebrile. ?HEENT ?No facial trauma or asymetry. Sinuses non tender.  ?Extra occullar muscles intact. ?External ears normal, . ?Neck: supple, no adenopathy,JVD or thyromegaly.No bruits. ? ?Chest: ?Clear to ascultation bilaterally.No crackles or wheezes. ?Non tender to palpation ? ?Breast: ?No asymetry,no masses or lumps. No tenderness. No nipple discharge or inversion. ?No axillary or supraclavicular adenopathy ? ?Cardiovascular system; ?Heart sounds normal,  S1 and  S2 ,no S3.  No murmur, or thrill. ?Apical beat not displaced ?Peripheral pulses normal. ? ?Abdomen: ?Soft, non tender, no organomegaly or masses. ?No bruits. ?Bowel sounds normal. ?No guarding, tenderness or rebound. ? ? ?GU: ?External genitalia normal female genitalia , normal female distribution of hair. No lesions. ?Urethral meatus normal in size, no  Prolapse, no lesions visibly  Present. ?Bladder non tender. ?Vagina pink and moist , with no visible lesions , discharge present . ?Adequate pelvic support no  cystocele or rectocele noted ? ?Uterus absent, no adnexal masses, no adnexal tenderness. ? ? ?Musculoskeletal exam: ?Decreased  ROM of lumbar spine,  adequate in hips , shoulders and knees. ?No deformity ,swelling or crepitus noted. ?No muscle wasting or atrophy.   ? ?Neurologic: ?Cranial nerves 2 to 12 intact. ?Power, tone ,sensation and reflexes normal throughout. ?No disturbance in gait. No tremor. ? ?Skin: ?Intact, no ulceration, erythema , scaling or rash noted. ?Pigmentation normal throughout ? ?Psych; ?Normal mood and affect. Judgement and concentration normal ? ? ?Assessment & Plan:  ?Back pain ?Uncontrolled.Toradol and depo medrol administered IM in the office , to be followed by a short course of oral prednisone  ? ?Annual physical exam ?Annual exam as documented. ?Immunization and cancer screening needs are specifically addressed at this visit. ? ? ?

## 2021-10-20 ENCOUNTER — Other Ambulatory Visit: Payer: Self-pay | Admitting: Family Medicine

## 2021-10-24 ENCOUNTER — Other Ambulatory Visit: Payer: Self-pay | Admitting: Family Medicine

## 2021-10-24 LAB — CYTOLOGY - PAP
Comment: NEGATIVE
Diagnosis: NEGATIVE
High risk HPV: NEGATIVE

## 2021-10-30 ENCOUNTER — Encounter: Payer: Self-pay | Admitting: *Deleted

## 2021-11-22 ENCOUNTER — Other Ambulatory Visit: Payer: Self-pay | Admitting: Family Medicine

## 2021-11-27 ENCOUNTER — Other Ambulatory Visit: Payer: Self-pay | Admitting: "Endocrinology

## 2021-11-27 ENCOUNTER — Other Ambulatory Visit: Payer: Self-pay | Admitting: Family Medicine

## 2021-11-28 ENCOUNTER — Ambulatory Visit: Payer: BC Managed Care – PPO | Admitting: "Endocrinology

## 2021-11-29 ENCOUNTER — Other Ambulatory Visit: Payer: Self-pay | Admitting: "Endocrinology

## 2021-11-29 DIAGNOSIS — E1165 Type 2 diabetes mellitus with hyperglycemia: Secondary | ICD-10-CM | POA: Diagnosis not present

## 2021-11-30 LAB — COMPREHENSIVE METABOLIC PANEL
ALT: 21 IU/L (ref 0–32)
AST: 22 IU/L (ref 0–40)
Albumin/Globulin Ratio: 1.5 (ref 1.2–2.2)
Albumin: 4.1 g/dL (ref 3.8–4.8)
Alkaline Phosphatase: 92 IU/L (ref 44–121)
BUN/Creatinine Ratio: 9 — ABNORMAL LOW (ref 12–28)
BUN: 10 mg/dL (ref 8–27)
Bilirubin Total: 0.3 mg/dL (ref 0.0–1.2)
CO2: 25 mmol/L (ref 20–29)
Calcium: 8.8 mg/dL (ref 8.7–10.3)
Chloride: 103 mmol/L (ref 96–106)
Creatinine, Ser: 1.09 mg/dL — ABNORMAL HIGH (ref 0.57–1.00)
Globulin, Total: 2.7 g/dL (ref 1.5–4.5)
Glucose: 74 mg/dL (ref 70–99)
Potassium: 4.6 mmol/L (ref 3.5–5.2)
Sodium: 144 mmol/L (ref 134–144)
Total Protein: 6.8 g/dL (ref 6.0–8.5)
eGFR: 57 mL/min/{1.73_m2} — ABNORMAL LOW (ref >59)

## 2021-12-03 ENCOUNTER — Encounter: Payer: Self-pay | Admitting: Family Medicine

## 2021-12-04 ENCOUNTER — Telehealth: Payer: Self-pay | Admitting: Family Medicine

## 2021-12-04 NOTE — Telephone Encounter (Signed)
Pt called stating there is a discount card that Dr. Moshe Cipro send in once a year to get her medication cheaper. She is wanting to know if this can be sent in for the [place] to [facility]?

## 2021-12-05 NOTE — Telephone Encounter (Signed)
Will send to [facility] in [place]

## 2021-12-20 ENCOUNTER — Ambulatory Visit (INDEPENDENT_AMBULATORY_CARE_PROVIDER_SITE_OTHER): Payer: BC Managed Care – PPO | Admitting: Family Medicine

## 2021-12-20 ENCOUNTER — Encounter: Payer: Self-pay | Admitting: Family Medicine

## 2021-12-20 VITALS — BP 144/82 | HR 99 | Ht 66.0 in | Wt 208.6 lb

## 2021-12-20 DIAGNOSIS — G4700 Insomnia, unspecified: Secondary | ICD-10-CM | POA: Diagnosis not present

## 2021-12-20 DIAGNOSIS — H6122 Impacted cerumen, left ear: Secondary | ICD-10-CM | POA: Diagnosis not present

## 2021-12-20 DIAGNOSIS — I10 Essential (primary) hypertension: Secondary | ICD-10-CM | POA: Diagnosis not present

## 2021-12-20 MED ORDER — AMLODIPINE BESYLATE 5 MG PO TABS: 5.0000 mg | ORAL_TABLET | Freq: Every day | ORAL | 3 refills | Status: DC

## 2021-12-20 MED ORDER — TEMAZEPAM 30 MG PO CAPS: 30.0000 mg | ORAL_CAPSULE | Freq: Every evening | ORAL | 0 refills | Status: DC | PRN

## 2021-12-20 NOTE — Assessment & Plan Note (Signed)
Left ear irrigation completed Encouraged to use otc debrox earwax removal kit

## 2021-12-20 NOTE — Patient Instructions (Signed)
I appreciate the opportunity to provide care to you today!  Please pick up your Refills at the pharmacy.     Please continue to a heart-healthy diet and increase your physical activities. Try to exercise for at least three times a week.      It was a pleasure to see you and I look forward to continuing to work together on your health and well-being. Please do not hesitate to call the office if you need care or have questions about your care.   Have a wonderful day and week. With Gratitude, Gilmore Laroche MSN, FNP-BC
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
## 2022-01-01 ENCOUNTER — Ambulatory Visit (HOSPITAL_COMMUNITY)
Admission: RE | Admit: 2022-01-01 | Discharge: 2022-01-01 | Disposition: A | Discharge status: Home / Self Care | Payer: Medicare Other | Source: Ambulatory Visit | Attending: Family Medicine | Admitting: Family Medicine

## 2022-01-01 DIAGNOSIS — Z1231 Encounter for screening mammogram for malignant neoplasm of breast: Secondary | ICD-10-CM | POA: Diagnosis not present

## 2022-01-22 ENCOUNTER — Other Ambulatory Visit: Payer: Self-pay | Admitting: Family Medicine

## 2022-01-29 ENCOUNTER — Telehealth: Payer: Self-pay | Admitting: Family Medicine

## 2022-01-29 ENCOUNTER — Other Ambulatory Visit: Payer: Self-pay

## 2022-01-29 MED ORDER — ACCU-CHEK SOFTCLIX LANCETS MISC: 12 refills | Status: AC

## 2022-01-29 MED ORDER — BACLOFEN 20 MG PO TABS: 20.0000 mg | ORAL_TABLET | Freq: Three times a day (TID) | ORAL | 1 refills | Status: DC

## 2022-01-29 NOTE — Telephone Encounter (Signed)
Meds refilled.

## 2022-01-29 NOTE — Telephone Encounter (Signed)
Pt called stating she has been unable to get meds filled and is wanting to know if she can please get a refill on 2 meds?   Accu-Chek Fast Click Lancets lancets  baclofen (LIORESAL) 20 MG tablet  [facility]

## 2022-01-30 ENCOUNTER — Other Ambulatory Visit: Payer: Self-pay

## 2022-01-30 DIAGNOSIS — E1159 Type 2 diabetes mellitus with other circulatory complications: Secondary | ICD-10-CM

## 2022-01-30 MED ORDER — GLUCOSE BLOOD VI STRP: ORAL_STRIP | 2 refills | Status: AC

## 2022-02-07 ENCOUNTER — Other Ambulatory Visit: Payer: Self-pay | Admitting: Family Medicine

## 2022-02-18 ENCOUNTER — Other Ambulatory Visit: Payer: Self-pay | Admitting: Family Medicine

## 2022-03-21 ENCOUNTER — Ambulatory Visit (INDEPENDENT_AMBULATORY_CARE_PROVIDER_SITE_OTHER): Payer: BC Managed Care – PPO | Admitting: Family Medicine

## 2022-03-21 ENCOUNTER — Encounter: Payer: Self-pay | Admitting: Family Medicine

## 2022-03-21 VITALS — BP 127/78 | HR 91 | Ht 66.0 in | Wt 202.1 lb

## 2022-03-21 DIAGNOSIS — E1169 Type 2 diabetes mellitus with other specified complication: Secondary | ICD-10-CM | POA: Diagnosis not present

## 2022-03-21 DIAGNOSIS — I10 Essential (primary) hypertension: Secondary | ICD-10-CM | POA: Diagnosis not present

## 2022-03-21 DIAGNOSIS — E785 Hyperlipidemia, unspecified: Secondary | ICD-10-CM

## 2022-03-21 DIAGNOSIS — M5442 Lumbago with sciatica, left side: Secondary | ICD-10-CM

## 2022-03-21 DIAGNOSIS — Z23 Encounter for immunization: Secondary | ICD-10-CM

## 2022-03-21 DIAGNOSIS — E1159 Type 2 diabetes mellitus with other circulatory complications: Secondary | ICD-10-CM | POA: Diagnosis not present

## 2022-03-21 DIAGNOSIS — E559 Vitamin D deficiency, unspecified: Secondary | ICD-10-CM

## 2022-03-21 DIAGNOSIS — G8929 Other chronic pain: Secondary | ICD-10-CM

## 2022-03-21 DIAGNOSIS — M5441 Lumbago with sciatica, right side: Secondary | ICD-10-CM | POA: Diagnosis not present

## 2022-03-21 MED ORDER — JANUMET XR 50-500 MG PO TB24
ORAL_TABLET | ORAL | 5 refills | Status: DC
Start: 2022-03-21 — End: 2022-10-03

## 2022-03-21 MED ORDER — GABAPENTIN 800 MG PO TABS: 800.0000 mg | ORAL_TABLET | Freq: Three times a day (TID) | ORAL | 5 refills | Status: DC

## 2022-03-21 NOTE — Patient Instructions (Addendum)
Annual exam in April call if you need me sooner.  Flu will vaccine today.  Microalbumin to be sent today.  Excellent foot exam.  Please get fasting labs in the morning as discussed.  Past due.  Nurse pls schedule diabetic screening retinal screening exam  nurse pls add  on TSH and vit D to labs ordered in April that she will get tomorrow  Please follow through on colonoscopy  Thanks for choosing [facility], we consider it a privelige to serve you.

## 2022-03-21 NOTE — Progress Notes (Signed)
Brittany Nunez     MRN: [id number]      DOB: [birth year]   HPI Brittany Nunez is here for follow up and re-evaluation of chronic medical conditions, medication management and review of any available recent lab and radiology data.  Preventive health is updated, specifically  Cancer screening and Immunization.   Questions or concerns regarding consultations or procedures which the PT has had in the interim are  addressed. The PT denies any adverse reactions to current medications since the last visit.  There are no new concerns.  There are no specific complaints   ROS Denies recent fever or chills. Denies sinus pressure, nasal congestion, ear pain or sore throat. Denies chest congestion, productive cough or wheezing. Denies chest pains, palpitations and leg swelling Denies abdominal pain, nausea, vomiting,diarrhea or constipation.   Denies dysuria, frequency, hesitancy or incontinence. Denies joint pain, swelling and limitation in mobility. Denies headaches, seizures, numbness, or tingling. Denies uncontrolled  depression, anxiety or insomnia. Denies skin break down or rash.   PE  BP 127/78 (BP Location: Right Arm, Patient Position: Sitting, Cuff Size: Large)   Pulse 91   Ht 5\' 6"  (1.676 m)   Wt 202 lb 1.9 oz (91.7 kg)   SpO2 96%   BMI 32.62 kg/m   Patient alert and oriented and in no cardiopulmonary distress.  HEENT: No facial asymmetry, EOMI,     Neck supple .  Chest: Clear to auscultation bilaterally.  CVS: S1, S2 no murmurs, no S3.Regular rate.  ABD: Soft non tender.   Ext: No edema  MS: Adequate ROM spine, shoulders, hips and knees.  Skin: Intact, no ulcerations or rash noted.  Psych: Good eye contact, normal affect. Memory intact not anxious or depressed appearing.  CNS: CN 2-12 intact, power,  normal throughout.no focal deficits noted.   Assessment & Plan  Essential hypertension Controlled, no change in medication DASH diet and commitment to daily physical  activity for a minimum of 30 minutes discussed and encouraged, as a part of hypertension management. The importance of attaining a healthy weight is also discussed.     03/21/2022    2:03 PM 12/20/2021    8:07 AM 10/19/2021    2:08 PM 08/18/2021    3:27 PM 08/18/2021    3:04 PM 05/30/2021    3:24 PM 04/20/2021    2:07 PM  BP/Weight  Systolic BP 127 144 130 124 143 134 101  Diastolic BP 78 82 81 84 79 84 68  Wt. (Lbs) 202.12 208.6 205  199.4 203.6 199.12  BMI 32.62 kg/m2 33.67 kg/m2 33.09 kg/m2  32.18 kg/m2 33.88 kg/m2 33.14 kg/m2       Type 2 diabetes mellitus with vascular disease (HCC) Updated lab needed at/ before next visit. Brittany Nunez is reminded of the importance of commitment to daily physical activity for 30 minutes or more, as able and the need to limit carbohydrate intake to 30 to 60 grams per meal to help with blood sugar control.   The need to take medication as prescribed, test blood sugar as directed, and to call between visits if there is a concern that blood sugar is uncontrolled is also discussed.   Brittany Nunez is reminded of the importance of daily foot exam, annual eye examination, and good blood sugar, blood pressure and cholesterol control.     Latest Ref Rng & Units 11/29/2021    2:40 PM 08/18/2021    3:39 PM 04/20/2021    2:52 PM 03/14/2021  11:22 AM 11/01/2020    1:04 PM  Diabetic Labs  HbA1c 4.8 - 5.6 %   6.5     Micro/Creat Ratio 0 - 29 mg/g creat     6   Chol 100 - 199 mg/dL    107    HDL >39 mg/dL    42    Calc LDL 0 - 99 mg/dL    50    Triglycerides 0 - 149 mg/dL    68    Creatinine 0.57 - 1.00 mg/dL 1.09  0.99   0.97        03/21/2022    2:03 PM 12/20/2021    8:07 AM 10/19/2021    2:08 PM 08/18/2021    3:27 PM 08/18/2021    3:04 PM 05/30/2021    3:24 PM 04/20/2021    2:07 PM  BP/Weight  Systolic BP 086 761 950 932 671 245 809  Diastolic BP 78 82 81 84 79 84 68  Wt. (Lbs) 202.12 208.6 205  199.4 203.6 199.12  BMI 32.62 kg/m2 33.67 kg/m2 33.09 kg/m2   32.18 kg/m2 33.88 kg/m2 33.14 kg/m2      Latest Ref Rng & Units 03/21/2022    2:00 PM 07/11/2018   12:00 AM  Foot/eye exam completion dates  Eye Exam No Retinopathy No Retinopathy  No Retinopathy       No Retinopathy      Foot Form Completion  Done      This result is from an external source.   Multiple values from one day are sorted in reverse-chronological order        Hyperlipidemia associated with type 2 diabetes mellitus ([place]) Hyperlipidemia:Low fat diet discussed and encouraged.   Lipid Panel  Lab Results  Component Value Date   CHOL 107 03/14/2021   HDL 42 03/14/2021   LDLCALC 50 03/14/2021   TRIG 68 03/14/2021   CHOLHDL 2.5 03/14/2021   Updated lab needed at/ before next visit.     Back pain Chronic, controlled by meds continue same

## 2022-03-21 NOTE — Assessment & Plan Note (Signed)
Chronic, controlled by meds continue same

## 2022-03-21 NOTE — Assessment & Plan Note (Signed)
Controlled, no change in medication DASH diet and commitment to daily physical activity for a minimum of 30 minutes discussed and encouraged, as a part of hypertension management. The importance of attaining a healthy weight is also discussed.     03/21/2022    2:03 PM 12/20/2021    8:07 AM 10/19/2021    2:08 PM 08/18/2021    3:27 PM 08/18/2021    3:04 PM 05/30/2021    3:24 PM 04/20/2021    2:07 PM  BP/Weight  Systolic BP 161 096 045 409 811 914 782  Diastolic BP 78 82 81 84 79 84 68  Wt. (Lbs) 202.12 208.6 205  199.4 203.6 199.12  BMI 32.62 kg/m2 33.67 kg/m2 33.09 kg/m2  32.18 kg/m2 33.88 kg/m2 33.14 kg/m2

## 2022-03-21 NOTE — Assessment & Plan Note (Signed)
Updated lab needed at/ before next visit. Brittany Nunez is reminded of the importance of commitment to daily physical activity for 30 minutes or more, as able and the need to limit carbohydrate intake to 30 to 60 grams per meal to help with blood sugar control.   The need to take medication as prescribed, test blood sugar as directed, and to call between visits if there is a concern that blood sugar is uncontrolled is also discussed.   Brittany Nunez is reminded of the importance of daily foot exam, annual eye examination, and good blood sugar, blood pressure and cholesterol control.     Latest Ref Rng & Units 11/29/2021    2:40 PM 08/18/2021    3:39 PM 04/20/2021    2:52 PM 03/14/2021   11:22 AM 11/01/2020    1:04 PM  Diabetic Labs  HbA1c 4.8 - 5.6 %   6.5     Micro/Creat Ratio 0 - 29 mg/g creat     6   Chol 100 - 199 mg/dL    107    HDL >39 mg/dL    42    Calc LDL 0 - 99 mg/dL    50    Triglycerides 0 - 149 mg/dL    68    Creatinine 0.57 - 1.00 mg/dL 1.09  0.99   0.97        03/21/2022    2:03 PM 12/20/2021    8:07 AM 10/19/2021    2:08 PM 08/18/2021    3:27 PM 08/18/2021    3:04 PM 05/30/2021    3:24 PM 04/20/2021    2:07 PM  BP/Weight  Systolic BP 810 175 102 585 277 824 235  Diastolic BP 78 82 81 84 79 84 68  Wt. (Lbs) 202.12 208.6 205  199.4 203.6 199.12  BMI 32.62 kg/m2 33.67 kg/m2 33.09 kg/m2  32.18 kg/m2 33.88 kg/m2 33.14 kg/m2      Latest Ref Rng & Units 03/21/2022    2:00 PM 07/11/2018   12:00 AM  Foot/eye exam completion dates  Eye Exam No Retinopathy No Retinopathy  No Retinopathy       No Retinopathy      Foot Form Completion  Done      This result is from an external source.   Multiple values from one day are sorted in reverse-chronological order

## 2022-03-21 NOTE — Assessment & Plan Note (Signed)
Hyperlipidemia:Low fat diet discussed and encouraged.   Lipid Panel  Lab Results  Component Value Date   CHOL 107 03/14/2021   HDL 42 03/14/2021   LDLCALC 50 03/14/2021   TRIG 68 03/14/2021   CHOLHDL 2.5 03/14/2021   Updated lab needed at/ before next visit.

## 2022-03-22 DIAGNOSIS — E118 Type 2 diabetes mellitus with unspecified complications: Secondary | ICD-10-CM | POA: Diagnosis not present

## 2022-03-22 DIAGNOSIS — E1159 Type 2 diabetes mellitus with other circulatory complications: Secondary | ICD-10-CM | POA: Diagnosis not present

## 2022-03-22 DIAGNOSIS — E559 Vitamin D deficiency, unspecified: Secondary | ICD-10-CM | POA: Diagnosis not present

## 2022-03-23 LAB — MICROALBUMIN / CREATININE URINE RATIO
Creatinine, Urine: 100.6 mg/dL
Microalb/Creat Ratio: <3 mg/g creat (ref 0–29)
Microalbumin, Urine: <3 ug/mL

## 2022-03-29 LAB — HEMOGLOBIN A1C
Est. average glucose Bld gHb Est-mCnc: 148 mg/dL
Hgb A1c MFr Bld: 6.8 % — ABNORMAL HIGH (ref 4.8–5.6)

## 2022-03-29 LAB — CBC
Hematocrit: 34 % (ref 34.0–46.6)
Hemoglobin: 11.1 g/dL (ref 11.1–15.9)
MCH: 26.1 pg — ABNORMAL LOW (ref 26.6–33.0)
MCHC: 32.6 g/dL (ref 31.5–35.7)
MCV: 80 fL (ref 79–97)
Platelets: 269 10*3/uL (ref 150–450)
RBC: 4.26 x10E6/uL (ref 3.77–5.28)
RDW: 13.6 % (ref 11.7–15.4)
WBC: 5.6 10*3/uL (ref 3.4–10.8)

## 2022-03-29 LAB — LIPID PANEL
Chol/HDL Ratio: 2.9 ratio (ref 0.0–4.4)
Cholesterol, Total: 102 mg/dL (ref 100–199)
HDL: 35 mg/dL — ABNORMAL LOW (ref >39)
LDL Chol Calc (NIH): 53 mg/dL (ref 0–99)
Triglycerides: 61 mg/dL (ref 0–149)
VLDL Cholesterol Cal: 14 mg/dL (ref 5–40)

## 2022-03-29 LAB — VITAMIN D 25 HYDROXY (VIT D DEFICIENCY, FRACTURES): Vit D, 25-Hydroxy: 44.6 ng/mL (ref 30.0–100.0)

## 2022-03-29 LAB — TSH: TSH: 1.92 u[IU]/mL (ref 0.450–4.500)

## 2022-04-17 ENCOUNTER — Ambulatory Visit: Payer: BC Managed Care – PPO

## 2022-04-17 ENCOUNTER — Other Ambulatory Visit: Payer: Self-pay | Admitting: Family Medicine

## 2022-04-17 DIAGNOSIS — G4700 Insomnia, unspecified: Secondary | ICD-10-CM

## 2022-04-17 LAB — HM DIABETES EYE EXAM

## 2022-04-17 NOTE — Telephone Encounter (Signed)
Please send electronically if you would like  

## 2022-04-18 ENCOUNTER — Encounter: Payer: Self-pay | Admitting: Family Medicine

## 2022-04-20 ENCOUNTER — Other Ambulatory Visit: Payer: Self-pay | Admitting: Family Medicine

## 2022-04-20 MED ORDER — TEMAZEPAM 30 MG PO CAPS: 30.0000 mg | ORAL_CAPSULE | Freq: Every day | ORAL | 1 refills | Status: DC

## 2022-05-15 ENCOUNTER — Other Ambulatory Visit: Payer: Self-pay | Admitting: Family Medicine

## 2022-05-29 ENCOUNTER — Other Ambulatory Visit: Payer: Self-pay | Admitting: Family Medicine

## 2022-06-14 ENCOUNTER — Encounter: Payer: Self-pay | Admitting: Family Medicine

## 2022-06-14 ENCOUNTER — Ambulatory Visit: Payer: Medicare Other | Admitting: Family Medicine

## 2022-06-14 DIAGNOSIS — R051 Acute cough: Secondary | ICD-10-CM

## 2022-06-14 DIAGNOSIS — J329 Chronic sinusitis, unspecified: Secondary | ICD-10-CM

## 2022-06-14 DIAGNOSIS — J309 Allergic rhinitis, unspecified: Secondary | ICD-10-CM

## 2022-06-14 LAB — POCT INFLUENZA A/B
Influenza A, POC: NEGATIVE
Influenza B, POC: NEGATIVE

## 2022-06-14 MED ORDER — PROMETHAZINE-DM 6.25-15 MG/5ML PO SYRP: ORAL_SOLUTION | ORAL | 0 refills | Status: DC

## 2022-06-14 MED ORDER — BENZONATATE 100 MG PO CAPS: 100.0000 mg | ORAL_CAPSULE | Freq: Two times a day (BID) | ORAL | 0 refills | Status: DC | PRN

## 2022-06-14 NOTE — Progress Notes (Signed)
Virtual Visit via Telephone Note  I connected with Brittany Nunez on 06/14/22 at 11:00 AM EST by telephone and verified that I am speaking with the correct person using two identifiers.  Location: Patient: home Provider: office   I discussed the limitations, risks, security and privacy concerns of performing an evaluation and management service by telephone and the availability of in person appointments. I also discussed with the patient that there may be a patient responsible charge related to this service. The patient expressed understanding and agreed to proceed.   History of Present Illness:   2 day h/o dry cough with body  aches , chills, rattling in chest, pressure over forehead, no nasal drainage, throat is sore as of today, no ear pain Observations/Objective: Good communication with no confusion and intact memory. Head and chest congestion noted with cough   Assessment and Plan:  Sinusitis Aute symptoms on setwith body ches , chills and cough, test for covis and flu, teat with antibiotic if flu test is negative  Acute cough Acute onset , azithromycin prescribed, will f/u covid test Tessalon perles and phenergan DM prescribed  Follow Up Instructions:    I discussed the assessment and treatment plan with the patient. The patient was provided an opportunity to ask questions and all were answered. The patient agreed with the plan and demonstrated an understanding of the instructions.   The patient was advised to call back or seek an in-person evaluation if the symptoms worsen or if the condition fails to improve as anticipated.  I provided 14 minutes of non-face-to-face time during this encounter.   [name], MD

## 2022-06-14 NOTE — Patient Instructions (Signed)
F/u  as before, call if you need me sooner  Come to the office around 1:15 today, a for influenza and covid testing. If your flu test is negative I will prescribe an antibiotic, and let you know  Tessalon perles are prescribed for cough and a cough suppressant syrup  Thanks for choosing [facility], we consider it a privelige to serve you.

## 2022-06-16 LAB — NOVEL CORONAVIRUS, NAA: SARS-CoV-2, NAA: NOT DETECTED

## 2022-06-19 ENCOUNTER — Encounter: Payer: Self-pay | Admitting: Family Medicine

## 2022-06-19 DIAGNOSIS — R051 Acute cough: Secondary | ICD-10-CM | POA: Insufficient documentation

## 2022-06-19 DIAGNOSIS — J329 Chronic sinusitis, unspecified: Secondary | ICD-10-CM | POA: Insufficient documentation

## 2022-06-19 NOTE — Assessment & Plan Note (Signed)
Aute symptoms on setwith body ches , chills and cough, test for covis and flu, teat with antibiotic if flu test is negative

## 2022-06-19 NOTE — Assessment & Plan Note (Addendum)
Acute onset , azithromycin prescribed, will f/u covid test Tessalon perles and phenergan DM prescribed

## 2022-07-19 ENCOUNTER — Other Ambulatory Visit: Payer: Self-pay | Admitting: Family Medicine

## 2022-08-23 ENCOUNTER — Encounter: Payer: Self-pay | Admitting: Radiology

## 2022-09-03 ENCOUNTER — Other Ambulatory Visit: Payer: Self-pay | Admitting: Family Medicine

## 2022-09-12 ENCOUNTER — Other Ambulatory Visit: Payer: Self-pay | Admitting: Family Medicine

## 2022-10-02 ENCOUNTER — Other Ambulatory Visit: Payer: Self-pay | Admitting: Family Medicine

## 2022-10-03 MED ORDER — VENLAFAXINE HCL ER 75 MG PO CP24: 75.0000 mg | ORAL_CAPSULE | Freq: Every day | ORAL | 0 refills | Status: DC

## 2022-10-15 ENCOUNTER — Other Ambulatory Visit: Payer: Self-pay | Admitting: Family Medicine

## 2022-10-15 DIAGNOSIS — G4700 Insomnia, unspecified: Secondary | ICD-10-CM

## 2022-10-17 MED ORDER — TEMAZEPAM 30 MG PO CAPS: 30.0000 mg | ORAL_CAPSULE | Freq: Every evening | ORAL | 0 refills | Status: DC | PRN

## 2022-10-22 ENCOUNTER — Other Ambulatory Visit: Payer: Self-pay | Admitting: Family Medicine

## 2022-10-23 ENCOUNTER — Encounter: Payer: BC Managed Care – PPO | Admitting: Family Medicine

## 2022-10-23 MED ORDER — BACLOFEN 20 MG PO TABS: 20.0000 mg | ORAL_TABLET | Freq: Three times a day (TID) | ORAL | 0 refills | Status: DC

## 2022-12-06 ENCOUNTER — Other Ambulatory Visit: Payer: Self-pay | Admitting: Family Medicine

## 2022-12-06 DIAGNOSIS — I10 Essential (primary) hypertension: Secondary | ICD-10-CM

## 2022-12-26 ENCOUNTER — Other Ambulatory Visit: Payer: Self-pay | Admitting: Family Medicine

## 2022-12-31 ENCOUNTER — Other Ambulatory Visit: Payer: Self-pay | Admitting: Family Medicine

## 2023-01-14 ENCOUNTER — Other Ambulatory Visit: Payer: Self-pay | Admitting: Family Medicine

## 2023-01-16 ENCOUNTER — Other Ambulatory Visit: Payer: Self-pay | Admitting: Family Medicine

## 2023-01-16 DIAGNOSIS — G4700 Insomnia, unspecified: Secondary | ICD-10-CM

## 2023-01-21 ENCOUNTER — Other Ambulatory Visit: Payer: Self-pay

## 2023-01-21 DIAGNOSIS — E1169 Type 2 diabetes mellitus with other specified complication: Secondary | ICD-10-CM

## 2023-01-21 DIAGNOSIS — E1159 Type 2 diabetes mellitus with other circulatory complications: Secondary | ICD-10-CM

## 2023-01-21 DIAGNOSIS — E559 Vitamin D deficiency, unspecified: Secondary | ICD-10-CM

## 2023-01-21 DIAGNOSIS — I10 Essential (primary) hypertension: Secondary | ICD-10-CM

## 2023-01-21 DIAGNOSIS — E118 Type 2 diabetes mellitus with unspecified complications: Secondary | ICD-10-CM

## 2023-02-19 ENCOUNTER — Other Ambulatory Visit: Payer: Self-pay | Admitting: Family Medicine

## 2023-02-19 DIAGNOSIS — G4700 Insomnia, unspecified: Secondary | ICD-10-CM

## 2023-02-21 ENCOUNTER — Other Ambulatory Visit (HOSPITAL_COMMUNITY): Payer: Self-pay | Admitting: Family Medicine

## 2023-02-21 DIAGNOSIS — Z1231 Encounter for screening mammogram for malignant neoplasm of breast: Secondary | ICD-10-CM

## 2023-02-25 ENCOUNTER — Other Ambulatory Visit: Payer: Self-pay | Admitting: Family Medicine

## 2023-02-25 DIAGNOSIS — G4700 Insomnia, unspecified: Secondary | ICD-10-CM

## 2023-02-26 ENCOUNTER — Other Ambulatory Visit: Payer: Self-pay | Admitting: Family Medicine

## 2023-02-26 MED ORDER — TEMAZEPAM 30 MG PO CAPS: 30.0000 mg | ORAL_CAPSULE | Freq: Every day | ORAL | 1 refills | Status: DC

## 2023-02-27 MED ORDER — GABAPENTIN 800 MG PO TABS: 800.0000 mg | ORAL_TABLET | Freq: Three times a day (TID) | ORAL | 0 refills | Status: DC

## 2023-02-27 MED ORDER — BACLOFEN 20 MG PO TABS: 20.0000 mg | ORAL_TABLET | Freq: Three times a day (TID) | ORAL | 0 refills | Status: DC

## 2023-03-01 ENCOUNTER — Ambulatory Visit
Admission: EM | Admit: 2023-03-01 | Discharge: 2023-03-01 | Disposition: A | Discharge status: Home / Self Care | Payer: Medicare Other | Attending: Physician Assistant | Admitting: Physician Assistant

## 2023-03-01 ENCOUNTER — Ambulatory Visit (HOSPITAL_COMMUNITY)
Admission: RE | Admit: 2023-03-01 | Discharge: 2023-03-01 | Disposition: A | Discharge status: Home / Self Care | Payer: Medicare Other | Source: Ambulatory Visit | Attending: Family Medicine | Admitting: Family Medicine

## 2023-03-01 ENCOUNTER — Encounter: Payer: Self-pay | Admitting: Emergency Medicine

## 2023-03-01 DIAGNOSIS — Z1231 Encounter for screening mammogram for malignant neoplasm of breast: Secondary | ICD-10-CM | POA: Insufficient documentation

## 2023-03-01 DIAGNOSIS — R509 Fever, unspecified: Secondary | ICD-10-CM | POA: Insufficient documentation

## 2023-03-01 DIAGNOSIS — R051 Acute cough: Secondary | ICD-10-CM | POA: Insufficient documentation

## 2023-03-01 DIAGNOSIS — Z20822 Contact with and (suspected) exposure to covid-19: Secondary | ICD-10-CM | POA: Insufficient documentation

## 2023-03-01 DIAGNOSIS — J069 Acute upper respiratory infection, unspecified: Secondary | ICD-10-CM | POA: Diagnosis not present

## 2023-03-01 MED ORDER — FLUTICASONE PROPIONATE 50 MCG/ACT NA SUSP: 2.0000 | Freq: Every day | NASAL | 0 refills | Status: DC

## 2023-03-01 MED ORDER — BENZONATATE 100 MG PO CAPS: 100.0000 mg | ORAL_CAPSULE | Freq: Three times a day (TID) | ORAL | 0 refills | Status: DC

## 2023-03-01 MED ORDER — ACETAMINOPHEN 325 MG PO TABS
975.0000 mg | ORAL_TABLET | Freq: Once | ORAL | Status: AC
  Administered 2023-03-01: 975 mg via ORAL

## 2023-03-01 NOTE — Discharge Instructions (Signed)
We will contact you if you are positive for COVID.  Please use Tessalon for cough.  Use fluticasone for nasal spray.  You can use over-the-counter medications including Tylenol, ibuprofen, Mucinex, nasal saline/sinus rinses for additional symptom relief.  Make sure that you rest and drink plenty of fluid.  If you are positive for COVID you are a candidate for Paxlovid given your history.  While on Paxlovid you will need to hold your amlodipine and rosuvastatin while on the medication for 5 days after completing course.  Obtain a pulse oximeter and monitor your oxygen saturation.  If this goes below 90% you need to go to the emergency room.  If you have any worsening symptoms including worsening cough, shortness of breath, high fever, weakness, nausea/vomiting interfering with oral intake need to be seen immediately.  If your symptoms are not improving within a week please return for reevaluation.
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
## 2023-03-01 NOTE — ED Triage Notes (Signed)
Cough, burning sensation in throat that started yesterday with body aches and cramps.  Fever today.

## 2023-03-02 ENCOUNTER — Telehealth: Payer: Self-pay

## 2023-03-02 ENCOUNTER — Telehealth (HOSPITAL_COMMUNITY): Payer: Self-pay | Admitting: Physician Assistant

## 2023-03-02 LAB — BASIC METABOLIC PANEL
BUN/Creatinine Ratio: 9 — ABNORMAL LOW (ref 12–28)
BUN: 11 mg/dL (ref 8–27)
CO2: 25 mmol/L (ref 20–29)
Calcium: 8.9 mg/dL (ref 8.7–10.3)
Chloride: 101 mmol/L (ref 96–106)
Creatinine, Ser: 1.18 mg/dL — ABNORMAL HIGH (ref 0.57–1.00)
Glucose: 115 mg/dL — ABNORMAL HIGH (ref 70–99)
Potassium: 4.2 mmol/L (ref 3.5–5.2)
Sodium: 140 mmol/L (ref 134–144)
eGFR: 52 mL/min/{1.73_m2} — ABNORMAL LOW (ref >59)

## 2023-03-02 LAB — SARS CORONAVIRUS 2 (TAT 6-24 HRS): SARS Coronavirus 2: POSITIVE — AB

## 2023-03-02 MED ORDER — NIRMATRELVIR/RITONAVIR (PAXLOVID) TABLET (RENAL DOSING): 2.0000 | ORAL_TABLET | Freq: Two times a day (BID) | ORAL | 0 refills | Status: AC

## 2023-03-02 NOTE — Telephone Encounter (Signed)
Patient tested positive for COVID.  Contacted her and discussed her positive results.  She is a candidate for antiviral therapy.  Based on BMP from yesterday she does require renal dosing of Paxlovid and this was sent to pharmacy.  Discussed with her that she is to hold her amlodipine and rosuvastatin while on this medication and for 5 days after completing course to which she expressed understanding.  This information was also provided in her after visit summary.  She is to obtain a pulse oximeter monitor oxygen saturation.  Discussed alarm symptoms that warrant emergent evaluation.  Patient expressed understanding.

## 2023-03-08 ENCOUNTER — Other Ambulatory Visit: Payer: Self-pay | Admitting: Family Medicine

## 2023-03-08 DIAGNOSIS — I10 Essential (primary) hypertension: Secondary | ICD-10-CM

## 2023-03-29 ENCOUNTER — Ambulatory Visit (INDEPENDENT_AMBULATORY_CARE_PROVIDER_SITE_OTHER): Payer: Medicare Other | Admitting: Family Medicine

## 2023-03-29 ENCOUNTER — Encounter: Payer: Self-pay | Admitting: Family Medicine

## 2023-03-29 VITALS — BP 116/75 | HR 106 | Ht 66.0 in | Wt 206.0 lb

## 2023-03-29 DIAGNOSIS — E1159 Type 2 diabetes mellitus with other circulatory complications: Secondary | ICD-10-CM | POA: Diagnosis not present

## 2023-03-29 DIAGNOSIS — Z23 Encounter for immunization: Secondary | ICD-10-CM

## 2023-03-29 DIAGNOSIS — M5442 Lumbago with sciatica, left side: Secondary | ICD-10-CM

## 2023-03-29 DIAGNOSIS — I10 Essential (primary) hypertension: Secondary | ICD-10-CM

## 2023-03-29 DIAGNOSIS — E1169 Type 2 diabetes mellitus with other specified complication: Secondary | ICD-10-CM

## 2023-03-29 DIAGNOSIS — G8929 Other chronic pain: Secondary | ICD-10-CM

## 2023-03-29 DIAGNOSIS — M62838 Other muscle spasm: Secondary | ICD-10-CM | POA: Diagnosis not present

## 2023-03-29 DIAGNOSIS — M5441 Lumbago with sciatica, right side: Secondary | ICD-10-CM

## 2023-03-29 DIAGNOSIS — E559 Vitamin D deficiency, unspecified: Secondary | ICD-10-CM

## 2023-03-29 DIAGNOSIS — E66811 Obesity, class 1: Secondary | ICD-10-CM

## 2023-03-29 DIAGNOSIS — F321 Major depressive disorder, single episode, moderate: Secondary | ICD-10-CM

## 2023-03-29 DIAGNOSIS — K219 Gastro-esophageal reflux disease without esophagitis: Secondary | ICD-10-CM

## 2023-03-29 DIAGNOSIS — E785 Hyperlipidemia, unspecified: Secondary | ICD-10-CM

## 2023-03-29 MED ORDER — JANUMET XR 100-1000 MG PO TB24: ORAL_TABLET | ORAL | 1 refills | Status: DC

## 2023-03-29 NOTE — Patient Instructions (Addendum)
F/u in 5.5 months  call if you need me sooner  Wellness visit to be scheduled at checkout  Urine aCR today  Fasting  labs ordered in 12/2022 next Monday please pls add magnesium level, dx muscle spasm Urine ACR today  Foot exam today   .Thanks for choosing [facility], we consider it a privelige to serve you.

## 2023-04-01 ENCOUNTER — Encounter: Payer: Self-pay | Admitting: Family Medicine

## 2023-04-01 DIAGNOSIS — E785 Hyperlipidemia, unspecified: Secondary | ICD-10-CM | POA: Diagnosis not present

## 2023-04-01 DIAGNOSIS — E559 Vitamin D deficiency, unspecified: Secondary | ICD-10-CM | POA: Diagnosis not present

## 2023-04-01 DIAGNOSIS — I10 Essential (primary) hypertension: Secondary | ICD-10-CM | POA: Diagnosis not present

## 2023-04-01 DIAGNOSIS — E118 Type 2 diabetes mellitus with unspecified complications: Secondary | ICD-10-CM | POA: Diagnosis not present

## 2023-04-01 DIAGNOSIS — E1169 Type 2 diabetes mellitus with other specified complication: Secondary | ICD-10-CM | POA: Diagnosis not present

## 2023-04-01 LAB — MICROALBUMIN / CREATININE URINE RATIO
Creatinine, Urine: 111.6 mg/dL
Microalbumin, Urine: <3 ug/mL

## 2023-04-01 MED ORDER — BACLOFEN 20 MG PO TABS: 20.0000 mg | ORAL_TABLET | Freq: Three times a day (TID) | ORAL | 5 refills | Status: DC

## 2023-04-01 MED ORDER — OMEPRAZOLE 20 MG PO CPDR: DELAYED_RELEASE_CAPSULE | ORAL | 1 refills | Status: DC

## 2023-04-01 MED ORDER — TEMAZEPAM 30 MG PO CAPS: 30.0000 mg | ORAL_CAPSULE | Freq: Every day | ORAL | 5 refills | Status: DC

## 2023-04-01 MED ORDER — VENLAFAXINE HCL ER 75 MG PO CP24: 75.0000 mg | ORAL_CAPSULE | Freq: Every day | ORAL | 1 refills | Status: DC

## 2023-04-01 MED ORDER — AMLODIPINE BESYLATE 5 MG PO TABS
5.0000 mg | ORAL_TABLET | Freq: Every day | ORAL | 1 refills | Status: DC
Start: 2023-04-01 — End: 2023-09-06

## 2023-04-01 MED ORDER — GABAPENTIN 800 MG PO TABS: 800.0000 mg | ORAL_TABLET | Freq: Three times a day (TID) | ORAL | 5 refills | Status: DC

## 2023-04-01 NOTE — Progress Notes (Signed)
Brittany Nunez     MRN: [id number]      DOB: September 12, 1960  Chief Complaint  Patient presents with   Follow-up    Back pain , discuss janumet medication, ankles swelling     HPI Brittany Nunez is here for follow up and re-evaluation of chronic medical conditions, medication management and review of any available recent lab and radiology data.  Preventive health is updated, specifically  Cancer screening and Immunization.   Questions or concerns regarding consultations or procedures which the PT has had in the interim are  addressed. The PT denies any adverse reactions to current medications since the last visit.  Denies polyuria, polydipsia, blurred vision , or hypoglycemic episodes.  ROS Denies recent fever or chills. Denies sinus pressure, nasal congestion, ear pain or sore throat. Denies chest congestion, productive cough or wheezing. Denies chest pains, palpitations and leg swelling Denies abdominal pain, nausea, vomiting,diarrhea or constipation.   Denies dysuria, frequency, hesitancy or incontinence. Chronic back pain, and reports ankles swe;l at end of day and go down overnight Denies headaches, seizures, numbness, or tingling. Denies depression, anxiety or insomnia. Denies skin break down or rash.   PE  BP 116/75 (BP Location: Right Arm, Patient Position: Sitting, Cuff Size: Large)   Pulse (!) 106   Ht 5\' 6"  (1.676 m)   Wt 206 lb 0.6 oz (93.5 kg)   SpO2 94%   BMI 33.26 kg/m   Patient alert and oriented and in no cardiopulmonary distress.  HEENT: No facial asymmetry, EOMI,     Neck supple .  Chest: Clear to auscultation bilaterally.  CVS: S1, S2 no murmurs, no S3.Regular rate.  ABD: Soft non tender.   Ext: No edema  MS: Adequate hough reduced  ROM spine, shoulders, hips and knees.  Skin: Intact, no ulcerations or rash noted.  Psych: Good eye contact, normal affect. Memory intact not anxious or depressed appearing.  CNS: CN 2-12 intact, power,  normal  throughout.no focal deficits noted.   Assessment & Plan  Type 2 diabetes mellitus with vascular disease Brittany Nunez) Brittany Nunez is reminded of the importance of commitment to daily physical activity for 30 minutes or more, as able and the need to limit carbohydrate intake to 30 to 60 grams per meal to help with blood sugar control.  Updated lab needed at/ before next visit.   The need to take medication as prescribed, test blood sugar as directed, and to call between visits if there is a concern that blood sugar is uncontrolled is also discussed.   Brittany Nunez is reminded of the importance of daily foot exam, annual eye examination, and good blood sugar, blood pressure and cholesterol control. .u     Latest Ref Rng & Units 03/29/2023    4:30 PM 03/01/2023    4:01 PM 03/22/2022    8:42 AM 03/21/2022    3:09 PM 11/29/2021    2:40 PM  Diabetic Labs  HbA1c 4.8 - 5.6 %   6.8     Micro/Creat Ratio  WILL FOLLOW  P   <3    Chol 100 - 199 mg/dL   956     HDL >21 mg/dL   35     Calc LDL 0 - 99 mg/dL   53     Triglycerides 0 - 149 mg/dL   61     Creatinine 3.08 - 1.00 mg/dL  6.57    8.46     P Preliminary result  03/29/2023    3:26 PM 03/01/2023    4:01 PM 03/01/2023    3:00 PM 03/21/2022    2:03 PM 12/20/2021    8:07 AM 10/19/2021    2:08 PM 08/18/2021    3:27 PM  BP/Weight  Systolic BP 116 117 110 127 144 130 124  Diastolic BP 75 78 78 78 82 81 84  Wt. (Lbs) 206.04   202.12 208.6 205   BMI 33.26 kg/m2   32.62 kg/m2 33.67 kg/m2 33.09 kg/m2       Latest Ref Rng & Units 04/17/2022   12:00 AM 03/21/2022    2:00 PM  Foot/eye exam completion dates  Eye Exam No Retinopathy No Retinopathy       Foot Form Completion   Done     This result is from an external source.        Hyperlipidemia associated with type 2 diabetes mellitus (HCC) Hyperlipidemia:Low fat diet discussed and encouraged.   Lipid Panel  Lab Results  Component Value Date   CHOL 102 03/22/2022   HDL 35 (L) 03/22/2022    LDLCALC 53 03/22/2022   TRIG 61 03/22/2022   CHOLHDL 2.9 03/22/2022     Updated lab needed at/ before next visit.   Essential hypertension Controlled, no change in medication DASH diet and commitment to daily physical activity for a minimum of 30 minutes discussed and encouraged, as a part of hypertension management. The importance of attaining a healthy weight is also discussed.     03/29/2023    3:26 PM 03/01/2023    4:01 PM 03/01/2023    3:00 PM 03/21/2022    2:03 PM 12/20/2021    8:07 AM 10/19/2021    2:08 PM 08/18/2021    3:27 PM  BP/Weight  Systolic BP 116 117 110 127 144 130 124  Diastolic BP 75 78 78 78 82 81 84  Wt. (Lbs) 206.04   202.12 208.6 205   BMI 33.26 kg/m2   32.62 kg/m2 33.67 kg/m2 33.09 kg/m2        Obesity (BMI 30.0-34.9)  Patient re-educated about  the importance of commitment to a  minimum of 150 minutes of exercise per week as able.  The importance of healthy food choices with portion control discussed, as well as eating regularly and within a 12 hour window most days. The need to choose "clean , green" food 50 to 75% of the time is discussed, as well as to make water the primary drink and set a goal of 64 ounces water daily.       03/29/2023    3:26 PM 03/21/2022    2:03 PM 12/20/2021    8:07 AM  Weight /BMI  Weight 206 lb 0.6 oz 202 lb 1.9 oz 208 lb 9.6 oz  Height 5\' 6"  (1.676 m) 5\' 6"  (1.676 m) 5\' 6"  (1.676 m)  BMI 33.26 kg/m2 32.62 kg/m2 33.67 kg/m2    Unchanged  Vitamin D deficiency Updated lab needed at/ before next visit.   Back pain Chronic and unchnaged, med refill x 6 months  Depression Controlled, no change in medication   GERD (gastroesophageal reflux disease) Controlled, no change in medication

## 2023-04-01 NOTE — Assessment & Plan Note (Signed)
Controlled, no change in medication  

## 2023-04-01 NOTE — Assessment & Plan Note (Signed)
Chronic and unchnaged, med refill x 6 months

## 2023-04-01 NOTE — Assessment & Plan Note (Signed)
Hyperlipidemia:Low fat diet discussed and encouraged.   Lipid Panel  Lab Results  Component Value Date   CHOL 102 03/22/2022   HDL 35 (L) 03/22/2022   LDLCALC 53 03/22/2022   TRIG 61 03/22/2022   CHOLHDL 2.9 03/22/2022     Updated lab needed at/ before next visit.

## 2023-04-01 NOTE — Assessment & Plan Note (Signed)
  Patient re-educated about  the importance of commitment to a  minimum of 150 minutes of exercise per week as able.  The importance of healthy food choices with portion control discussed, as well as eating regularly and within a 12 hour window most days. The need to choose "clean , green" food 50 to 75% of the time is discussed, as well as to make water the primary drink and set a goal of 64 ounces water daily.       03/29/2023    3:26 PM 03/21/2022    2:03 PM 12/20/2021    8:07 AM  Weight /BMI  Weight 206 lb 0.6 oz 202 lb 1.9 oz 208 lb 9.6 oz  Height 5\' 6"  (1.676 m) 5\' 6"  (1.676 m) 5\' 6"  (1.676 m)  BMI 33.26 kg/m2 32.62 kg/m2 33.67 kg/m2    Unchanged

## 2023-04-01 NOTE — Assessment & Plan Note (Addendum)
Brittany Nunez is reminded of the importance of commitment to daily physical activity for 30 minutes or more, as able and the need to limit carbohydrate intake to 30 to 60 grams per meal to help with blood sugar control.  Updated lab needed at/ before next visit.   The need to take medication as prescribed, test blood sugar as directed, and to call between visits if there is a concern that blood sugar is uncontrolled is also discussed.   Brittany Nunez is reminded of the importance of daily foot exam, annual eye examination, and good blood sugar, blood pressure and cholesterol control. .u     Latest Ref Rng & Units 03/29/2023    4:30 PM 03/01/2023    4:01 PM 03/22/2022    8:42 AM 03/21/2022    3:09 PM 11/29/2021    2:40 PM  Diabetic Labs  HbA1c 4.8 - 5.6 %   6.8     Micro/Creat Ratio  WILL FOLLOW  P   <3    Chol 100 - 199 mg/dL   161     HDL >09 mg/dL   35     Calc LDL 0 - 99 mg/dL   53     Triglycerides 0 - 149 mg/dL   61     Creatinine 6.04 - 1.00 mg/dL  5.40    9.81     P Preliminary result      03/29/2023    3:26 PM 03/01/2023    4:01 PM 03/01/2023    3:00 PM 03/21/2022    2:03 PM 12/20/2021    8:07 AM 10/19/2021    2:08 PM 08/18/2021    3:27 PM  BP/Weight  Systolic BP 116 117 110 127 144 130 124  Diastolic BP 75 78 78 78 82 81 84  Wt. (Lbs) 206.04   202.12 208.6 205   BMI 33.26 kg/m2   32.62 kg/m2 33.67 kg/m2 33.09 kg/m2       Latest Ref Rng & Units 04/17/2022   12:00 AM 03/21/2022    2:00 PM  Foot/eye exam completion dates  Eye Exam No Retinopathy No Retinopathy       Foot Form Completion   Done     This result is from an external source.

## 2023-04-01 NOTE — Assessment & Plan Note (Signed)
Updated lab needed at/ before next visit.   

## 2023-04-01 NOTE — Assessment & Plan Note (Signed)
Controlled, no change in medication DASH diet and commitment to daily physical activity for a minimum of 30 minutes discussed and encouraged, as a part of hypertension management. The importance of attaining a healthy weight is also discussed.     03/29/2023    3:26 PM 03/01/2023    4:01 PM 03/01/2023    3:00 PM 03/21/2022    2:03 PM 12/20/2021    8:07 AM 10/19/2021    2:08 PM 08/18/2021    3:27 PM  BP/Weight  Systolic BP 116 117 110 127 144 130 124  Diastolic BP 75 78 78 78 82 81 84  Wt. (Lbs) 206.04   202.12 208.6 205   BMI 33.26 kg/m2   32.62 kg/m2 33.67 kg/m2 33.09 kg/m2

## 2023-04-02 ENCOUNTER — Other Ambulatory Visit: Payer: Self-pay | Admitting: Family Medicine

## 2023-04-02 LAB — CMP14+EGFR
ALT: 16 [IU]/L (ref 0–32)
AST: 13 [IU]/L (ref 0–40)
Albumin: 3.9 g/dL (ref 3.9–4.9)
Alkaline Phosphatase: 87 [IU]/L (ref 44–121)
BUN/Creatinine Ratio: 7 — ABNORMAL LOW (ref 12–28)
BUN: 8 mg/dL (ref 8–27)
Bilirubin Total: <0.2 mg/dL (ref 0.0–1.2)
CO2: 24 mmol/L (ref 20–29)
Calcium: 9.2 mg/dL (ref 8.7–10.3)
Chloride: 106 mmol/L (ref 96–106)
Creatinine, Ser: 1.09 mg/dL — ABNORMAL HIGH (ref 0.57–1.00)
Globulin, Total: 2.9 g/dL (ref 1.5–4.5)
Glucose: 143 mg/dL — ABNORMAL HIGH (ref 70–99)
Potassium: 4.6 mmol/L (ref 3.5–5.2)
Sodium: 144 mmol/L (ref 134–144)
Total Protein: 6.8 g/dL (ref 6.0–8.5)
eGFR: 57 mL/min/{1.73_m2} — ABNORMAL LOW (ref >59)

## 2023-04-02 LAB — LIPID PANEL
Chol/HDL Ratio: 2.9 {ratio} (ref 0.0–4.4)
Cholesterol, Total: 121 mg/dL (ref 100–199)
HDL: 42 mg/dL (ref >39)
LDL Chol Calc (NIH): 63 mg/dL (ref 0–99)
Triglycerides: 79 mg/dL (ref 0–149)
VLDL Cholesterol Cal: 16 mg/dL (ref 5–40)

## 2023-04-02 LAB — CBC
Hematocrit: 38.4 % (ref 34.0–46.6)
Hemoglobin: 11.8 g/dL (ref 11.1–15.9)
MCH: 25.8 pg — ABNORMAL LOW (ref 26.6–33.0)
MCHC: 30.7 g/dL — ABNORMAL LOW (ref 31.5–35.7)
MCV: 84 fL (ref 79–97)
Platelets: 335 10*3/uL (ref 150–450)
RBC: 4.57 x10E6/uL (ref 3.77–5.28)
RDW: 13.4 % (ref 11.7–15.4)
WBC: 7.1 10*3/uL (ref 3.4–10.8)

## 2023-04-02 LAB — VITAMIN D 25 HYDROXY (VIT D DEFICIENCY, FRACTURES): Vit D, 25-Hydroxy: 56.5 ng/mL (ref 30.0–100.0)

## 2023-04-02 LAB — TSH: TSH: 2.8 u[IU]/mL (ref 0.450–4.500)

## 2023-04-02 LAB — HEMOGLOBIN A1C
Est. average glucose Bld gHb Est-mCnc: 166 mg/dL
Hgb A1c MFr Bld: 7.4 % — ABNORMAL HIGH (ref 4.8–5.6)

## 2023-04-02 LAB — MAGNESIUM: Magnesium: 1.9 mg/dL (ref 1.6–2.3)

## 2023-04-02 MED ORDER — EMPAGLIFLOZIN 10 MG PO TABS: 10.0000 mg | ORAL_TABLET | Freq: Every day | ORAL | 5 refills | Status: DC

## 2023-04-02 NOTE — Addendum Note (Signed)
Addended by: Kerri Perches on: 04/02/2023 08:02 AM   Modules accepted: Orders

## 2023-04-03 ENCOUNTER — Other Ambulatory Visit: Payer: Self-pay

## 2023-04-03 DIAGNOSIS — E1159 Type 2 diabetes mellitus with other circulatory complications: Secondary | ICD-10-CM

## 2023-04-03 MED ORDER — JANUMET XR 100-1000 MG PO TB24: ORAL_TABLET | ORAL | 1 refills | Status: DC

## 2023-04-17 ENCOUNTER — Other Ambulatory Visit: Payer: Self-pay | Admitting: Family Medicine

## 2023-04-17 LAB — HM DIABETES EYE EXAM

## 2023-04-29 ENCOUNTER — Ambulatory Visit: Payer: BC Managed Care – PPO

## 2023-07-18 ENCOUNTER — Ambulatory Visit: Payer: BC Managed Care – PPO

## 2023-07-18 DIAGNOSIS — E1159 Type 2 diabetes mellitus with other circulatory complications: Secondary | ICD-10-CM | POA: Diagnosis not present

## 2023-07-19 LAB — HEMOGLOBIN A1C
Est. average glucose Bld gHb Est-mCnc: 140 mg/dL
Hgb A1c MFr Bld: 6.5 % — ABNORMAL HIGH (ref 4.8–5.6)

## 2023-07-19 LAB — BMP8+EGFR
BUN/Creatinine Ratio: 10 — ABNORMAL LOW (ref 12–28)
BUN: 15 mg/dL (ref 8–27)
CO2: 26 mmol/L (ref 20–29)
Calcium: 9.7 mg/dL (ref 8.7–10.3)
Chloride: 103 mmol/L (ref 96–106)
Creatinine, Ser: 1.46 mg/dL — ABNORMAL HIGH (ref 0.57–1.00)
Glucose: 99 mg/dL (ref 70–99)
Potassium: 4.5 mmol/L (ref 3.5–5.2)
Sodium: 143 mmol/L (ref 134–144)
eGFR: 40 mL/min/{1.73_m2} — ABNORMAL LOW (ref >59)

## 2023-07-28 ENCOUNTER — Other Ambulatory Visit: Payer: Self-pay | Admitting: Family Medicine

## 2023-09-06 ENCOUNTER — Ambulatory Visit (INDEPENDENT_AMBULATORY_CARE_PROVIDER_SITE_OTHER): Payer: BC Managed Care – PPO | Admitting: Family Medicine

## 2023-09-06 ENCOUNTER — Encounter: Payer: Self-pay | Admitting: Family Medicine

## 2023-09-06 VITALS — BP 123/82 | HR 105 | Ht 66.0 in | Wt 186.0 lb

## 2023-09-06 DIAGNOSIS — R350 Frequency of micturition: Secondary | ICD-10-CM

## 2023-09-06 DIAGNOSIS — R634 Abnormal weight loss: Secondary | ICD-10-CM | POA: Diagnosis not present

## 2023-09-06 DIAGNOSIS — J309 Allergic rhinitis, unspecified: Secondary | ICD-10-CM | POA: Diagnosis not present

## 2023-09-06 DIAGNOSIS — I10 Essential (primary) hypertension: Secondary | ICD-10-CM

## 2023-09-06 DIAGNOSIS — E1169 Type 2 diabetes mellitus with other specified complication: Secondary | ICD-10-CM

## 2023-09-06 DIAGNOSIS — E785 Hyperlipidemia, unspecified: Secondary | ICD-10-CM | POA: Diagnosis not present

## 2023-09-06 DIAGNOSIS — N3000 Acute cystitis without hematuria: Secondary | ICD-10-CM | POA: Diagnosis not present

## 2023-09-06 DIAGNOSIS — N1831 Chronic kidney disease, stage 3a: Secondary | ICD-10-CM | POA: Diagnosis not present

## 2023-09-06 DIAGNOSIS — E1159 Type 2 diabetes mellitus with other circulatory complications: Secondary | ICD-10-CM | POA: Diagnosis not present

## 2023-09-06 LAB — GLUCOSE, POCT (MANUAL RESULT ENTRY): POC Glucose: 64 mg/dL — AB (ref 70–99)

## 2023-09-06 MED ORDER — OMEPRAZOLE 20 MG PO CPDR: DELAYED_RELEASE_CAPSULE | ORAL | 1 refills | Status: DC

## 2023-09-06 MED ORDER — JANUMET XR 100-1000 MG PO TB24: ORAL_TABLET | ORAL | 1 refills | Status: DC

## 2023-09-06 MED ORDER — VENLAFAXINE HCL ER 75 MG PO CP24: 75.0000 mg | ORAL_CAPSULE | Freq: Every day | ORAL | 1 refills | Status: DC

## 2023-09-06 MED ORDER — AZELASTINE HCL 0.1 % NA SOLN: 2.0000 | Freq: Two times a day (BID) | NASAL | 12 refills | Status: AC

## 2023-09-06 MED ORDER — ROSUVASTATIN CALCIUM 5 MG PO TABS
5.0000 mg | ORAL_TABLET | Freq: Every day | ORAL | 0 refills | Status: DC
Start: 2023-09-06 — End: 2024-01-21

## 2023-09-06 MED ORDER — AMLODIPINE BESYLATE 5 MG PO TABS: 5.0000 mg | ORAL_TABLET | Freq: Every day | ORAL | 1 refills | Status: DC

## 2023-09-06 MED ORDER — MONTELUKAST SODIUM 10 MG PO TABS: 10.0000 mg | ORAL_TABLET | Freq: Every day | ORAL | 3 refills | Status: DC

## 2023-09-06 MED ORDER — FLUTICASONE PROPIONATE 50 MCG/ACT NA SUSP: 2.0000 | Freq: Every day | NASAL | 0 refills | Status: DC

## 2023-09-06 NOTE — Patient Instructions (Addendum)
 F/U in 8 weeks re evaluate chronic problems   CC UA and culture if abnormal, lab collect today  CMP and EGR today and cBC  Random blood sugar in office today, pls let me know if over 200    You are being referred to kidney specialist and also to GI specialist , due to chronic kidney disease which is new , and unexplained significant weight loss, 20 pounds in 4 months due to lack of appetite  New additional medication sent for allergies, montelukast a tablet and Astelin a second nose spray   Thanks for choosing [facility], we consider it a privelige to serve you.

## 2023-09-07 LAB — CBC WITH DIFFERENTIAL/PLATELET
Basophils Absolute: 0 10*3/uL (ref 0.0–0.2)
Basos: 1 %
EOS (ABSOLUTE): 0.2 10*3/uL (ref 0.0–0.4)
Eos: 3 %
Hematocrit: 36.5 % (ref 34.0–46.6)
Hemoglobin: 11.7 g/dL (ref 11.1–15.9)
Immature Grans (Abs): 0 10*3/uL (ref 0.0–0.1)
Immature Granulocytes: 1 %
Lymphocytes Absolute: 2.5 10*3/uL (ref 0.7–3.1)
Lymphs: 29 %
MCH: 26.1 pg — ABNORMAL LOW (ref 26.6–33.0)
MCHC: 32.1 g/dL (ref 31.5–35.7)
MCV: 82 fL (ref 79–97)
Monocytes Absolute: 0.5 10*3/uL (ref 0.1–0.9)
Monocytes: 6 %
Neutrophils Absolute: 5.2 10*3/uL (ref 1.4–7.0)
Neutrophils: 60 %
Platelets: 328 10*3/uL (ref 150–450)
RBC: 4.48 x10E6/uL (ref 3.77–5.28)
RDW: 13.8 % (ref 11.7–15.4)
WBC: 8.6 10*3/uL (ref 3.4–10.8)

## 2023-09-07 LAB — CMP14+EGFR
ALT: 12 IU/L (ref 0–32)
AST: 17 IU/L (ref 0–40)
Albumin: 4.4 g/dL (ref 3.9–4.9)
Alkaline Phosphatase: 92 IU/L (ref 44–121)
BUN/Creatinine Ratio: 9 — ABNORMAL LOW (ref 12–28)
BUN: 10 mg/dL (ref 8–27)
Bilirubin Total: 0.3 mg/dL (ref 0.0–1.2)
CO2: 26 mmol/L (ref 20–29)
Calcium: 9.7 mg/dL (ref 8.7–10.3)
Chloride: 103 mmol/L (ref 96–106)
Creatinine, Ser: 1.13 mg/dL — ABNORMAL HIGH (ref 0.57–1.00)
Globulin, Total: 2.8 g/dL (ref 1.5–4.5)
Glucose: 73 mg/dL (ref 70–99)
Potassium: 4.8 mmol/L (ref 3.5–5.2)
Sodium: 142 mmol/L (ref 134–144)
Total Protein: 7.2 g/dL (ref 6.0–8.5)
eGFR: 54 mL/min/{1.73_m2} — ABNORMAL LOW (ref >59)

## 2023-09-10 LAB — UA/M W/RFLX CULTURE, ROUTINE
Bilirubin, UA: NEGATIVE
Glucose, UA: NEGATIVE
Ketones, UA: NEGATIVE
Nitrite, UA: NEGATIVE
Protein,UA: NEGATIVE
RBC, UA: NEGATIVE
Specific Gravity, UA: 1.015 (ref 1.005–1.030)
Urobilinogen, Ur: 1 mg/dL (ref 0.2–1.0)
pH, UA: 5.5 (ref 5.0–7.5)

## 2023-09-10 LAB — MICROSCOPIC EXAMINATION
Bacteria, UA: NONE SEEN
Casts: NONE SEEN /LPF
RBC, Urine: NONE SEEN /HPF (ref 0–2)

## 2023-09-10 LAB — URINE CULTURE, REFLEX

## 2023-09-11 ENCOUNTER — Encounter: Payer: Self-pay | Admitting: Family Medicine

## 2023-09-17 DIAGNOSIS — N183 Chronic kidney disease, stage 3 unspecified: Secondary | ICD-10-CM | POA: Insufficient documentation

## 2023-09-17 DIAGNOSIS — R634 Abnormal weight loss: Secondary | ICD-10-CM | POA: Insufficient documentation

## 2023-09-17 NOTE — Assessment & Plan Note (Signed)
 Controlled, no change in medication DASH diet and commitment to daily physical activity for a minimum of 30 minutes discussed and encouraged, as a part of hypertension management. The importance of attaining a healthy weight is also discussed.     09/06/2023    3:23 PM 07/18/2023    4:14 PM 04/29/2023    4:04 PM 04/29/2023    4:03 PM 03/29/2023    3:26 PM 03/01/2023    4:01 PM 03/01/2023    3:00 PM  BP/Weight  Systolic BP 123  161 116 116 117 110  Diastolic BP 82  80 78 75 78 78  Wt. (Lbs) 186 195   206.04    BMI 30.02 kg/m2 31.47 kg/m2   33.26 kg/m2

## 2023-09-17 NOTE — Assessment & Plan Note (Signed)
 UNCONTROLLED , START DAILY ASTELIN AND SINGULAIR

## 2023-09-17 NOTE — Assessment & Plan Note (Signed)
 Diabetes associated with hypertension, hyperlipidemia, arthritis, and depression  Brittany Nunez is reminded of the importance of commitment to daily physical activity for 30 minutes or more, as able and the need to limit carbohydrate intake to 30 to 60 grams per meal to help with blood sugar control.   The need to take medication as prescribed, test blood sugar as directed, and to call between visits if there is a concern that blood sugar is uncontrolled is also discussed.   Brittany Nunez is reminded of the importance of daily foot exam, annual eye examination, and good blood sugar, blood pressure and cholesterol control.     Latest Ref Rng & Units 09/06/2023    4:44 PM 07/18/2023    4:01 PM 04/01/2023    8:40 AM 03/29/2023    4:30 PM 03/01/2023    4:01 PM  Diabetic Labs  HbA1c 4.8 - 5.6 %  6.5  7.4     Micro/Creat Ratio 0 - 29 mg/g creat    <3    Chol 100 - 199 mg/dL   098     HDL >11 mg/dL   42     Calc LDL 0 - 99 mg/dL   63     Triglycerides 0 - 149 mg/dL   79     Creatinine 9.14 - 1.00 mg/dL 7.82  9.56  2.13   0.86       09/06/2023    3:23 PM 07/18/2023    4:14 PM 04/29/2023    4:04 PM 04/29/2023    4:03 PM 03/29/2023    3:26 PM 03/01/2023    4:01 PM 03/01/2023    3:00 PM  BP/Weight  Systolic BP 123  578 116 116 117 110  Diastolic BP 82  80 78 75 78 78  Wt. (Lbs) 186 195   206.04    BMI 30.02 kg/m2 31.47 kg/m2   33.26 kg/m2        Latest Ref Rng & Units 04/17/2023   12:00 AM 04/17/2022   12:00 AM  Foot/eye exam completion dates  Eye Exam No Retinopathy No Retinopathy     No Retinopathy         This result is from an external source.

## 2023-09-17 NOTE — Assessment & Plan Note (Signed)
 Diabetic and hypertensive with ckd, refer nephrology

## 2023-09-17 NOTE — Assessment & Plan Note (Signed)
 Hyperlipidemia:Low fat diet discussed and encouraged.   Lipid Panel  Lab Results  Component Value Date   CHOL 121 04/01/2023   HDL 42 04/01/2023   LDLCALC 63 04/01/2023   TRIG 79 04/01/2023   CHOLHDL 2.9 04/01/2023     Controlled, no change in medication

## 2023-09-17 NOTE — Progress Notes (Signed)
 Brittany Nunez     MRN: [id number]      DOB: 1961-04-24  Chief Complaint  Patient presents with   Follow-up    5 month Urination frequency. Started last month and for 3-5 days pt. Reports burning and odor but that subsided, however the frequency continued.  Horse voice and sinus pressure since last week, the pressure has subsided but her voice continues to go in and out.     HPI [name] is here for follow up and re-evaluation of chronic medical conditions, medication management and review of any available recent lab and radiology data.  Preventive health is updated, specifically  Cancer screening and Immunization.   Questions or concerns regarding consultations or procedures which the PT has had in the interim are  addressed. The PT denies any adverse reactions to current medications since the last visit.  Concerns as above ROS Denies recent fever or chills. . Denies chest congestion, productive cough or wheezing. Denies chest pains, palpitations and leg swelling Denies abdominal pain, nausea, vomiting,diarrhea or constipation.   Denies joint pain, swelling and limitation in mobility. Denies headaches, seizures, numbness, or tingling. Denies depression, anxiety or insomnia. Denies skin break down or rash.   PE  BP 123/82   Pulse (!) 105   Ht 5\' 6"  (1.676 m)   Wt 186 lb (84.4 kg)   SpO2 92%   BMI 30.02 kg/m   Patient alert and oriented and in no cardiopulmonary distress.  HEENT: No facial asymmetry, EOMI,     Neck supple .NASAL CONGESTION NO SINUS TENDERNESS  Chest: Clear to auscultation bilaterally.  CVS: S1, S2 no murmurs, no S3.Regular rate.  ABD: Soft non tender.   Ext: No edema  MS: Adequate ROM spine, shoulders, hips and knees.  Skin: Intact, no ulcerations or rash noted.  Psych: Good eye contact, normal affect. Memory intact not anxious or depressed appearing.  CNS: CN 2-12 intact, power,  normal throughout.no focal deficits noted.   Assessment &  Plan  Type 2 diabetes mellitus with vascular disease (HCC) Diabetes associated with hypertension, hyperlipidemia, arthritis, and depression  [name] is reminded of the importance of commitment to daily physical activity for 30 minutes or more, as able and the need to limit carbohydrate intake to 30 to 60 grams per meal to help with blood sugar control.   The need to take medication as prescribed, test blood sugar as directed, and to call between visits if there is a concern that blood sugar is uncontrolled is also discussed.   [name] is reminded of the importance of daily foot exam, annual eye examination, and good blood sugar, blood pressure and cholesterol control.     Latest Ref Rng & Units 09/06/2023    4:44 PM 07/18/2023    4:01 PM 04/01/2023    8:40 AM 03/29/2023    4:30 PM 03/01/2023    4:01 PM  Diabetic Labs  HbA1c 4.8 - 5.6 %  6.5  7.4     Micro/Creat Ratio 0 - 29 mg/g creat    <3    Chol 100 - 199 mg/dL   409     HDL >81 mg/dL   42     Calc LDL 0 - 99 mg/dL   63     Triglycerides 0 - 149 mg/dL   79     Creatinine 1.91 - 1.00 mg/dL 4.78  2.95  6.21   3.08       09/06/2023    3:23  PM 07/18/2023    4:14 PM 04/29/2023    4:04 PM 04/29/2023    4:03 PM 03/29/2023    3:26 PM 03/01/2023    4:01 PM 03/01/2023    3:00 PM  BP/Weight  Systolic BP 123  132 116 116 117 110  Diastolic BP 82  80 78 75 78 78  Wt. (Lbs) 186 195   206.04    BMI 30.02 kg/m2 31.47 kg/m2   33.26 kg/m2        Latest Ref Rng & Units 04/17/2023   12:00 AM 04/17/2022   12:00 AM  Foot/eye exam completion dates  Eye Exam No Retinopathy No Retinopathy     No Retinopathy         This result is from an external source.        CKD (chronic kidney disease) stage 3, GFR 30-59 ml/min (HCC) Diabetic and hypertensive with ckd, refer nephrology  Unintentional weight loss of more than 10 pounds 20 pound weight loss in 4 months with poor appetite, refer to GI  Allergic sinusitis UNCONTROLLED , START DAILY  ASTELIN AND SINGULAIR  Essential hypertension Controlled, no change in medication DASH diet and commitment to daily physical activity for a minimum of 30 minutes discussed and encouraged, as a part of hypertension management. The importance of attaining a healthy weight is also discussed.     09/06/2023    3:23 PM 07/18/2023    4:14 PM 04/29/2023    4:04 PM 04/29/2023    4:03 PM 03/29/2023    3:26 PM 03/01/2023    4:01 PM 03/01/2023    3:00 PM  BP/Weight  Systolic BP 123  440 116 116 117 110  Diastolic BP 82  80 78 75 78 78  Wt. (Lbs) 186 195   206.04    BMI 30.02 kg/m2 31.47 kg/m2   33.26 kg/m2         Hyperlipidemia associated with type 2 diabetes mellitus (HCC) Hyperlipidemia:Low fat diet discussed and encouraged.   Lipid Panel  Lab Results  Component Value Date   CHOL 121 04/01/2023   HDL 42 04/01/2023   LDLCALC 63 04/01/2023   TRIG 79 04/01/2023   CHOLHDL 2.9 04/01/2023     Controlled, no change in medication

## 2023-09-17 NOTE — Assessment & Plan Note (Signed)
 20 pound weight loss in 4 months with poor appetite, refer to GI

## 2023-09-19 ENCOUNTER — Encounter: Payer: Self-pay | Admitting: *Deleted

## 2023-09-30 ENCOUNTER — Other Ambulatory Visit: Payer: Self-pay | Admitting: Family Medicine

## 2023-10-01 ENCOUNTER — Other Ambulatory Visit: Payer: Self-pay | Admitting: Family Medicine

## 2023-10-01 MED ORDER — TEMAZEPAM 30 MG PO CAPS: 30.0000 mg | ORAL_CAPSULE | Freq: Every day | ORAL | 5 refills | Status: DC

## 2023-10-17 ENCOUNTER — Ambulatory Visit (INDEPENDENT_AMBULATORY_CARE_PROVIDER_SITE_OTHER): Admitting: Internal Medicine

## 2023-10-17 ENCOUNTER — Encounter: Payer: Self-pay | Admitting: Internal Medicine

## 2023-10-17 VITALS — BP 127/75 | HR 111 | Temp 98.0°F | Ht 66.0 in | Wt 187.8 lb

## 2023-10-17 DIAGNOSIS — R630 Anorexia: Secondary | ICD-10-CM

## 2023-10-17 DIAGNOSIS — R634 Abnormal weight loss: Secondary | ICD-10-CM | POA: Diagnosis not present

## 2023-10-17 DIAGNOSIS — R1310 Dysphagia, unspecified: Secondary | ICD-10-CM

## 2023-10-17 DIAGNOSIS — Z1211 Encounter for screening for malignant neoplasm of colon: Secondary | ICD-10-CM

## 2023-10-17 DIAGNOSIS — R1319 Other dysphagia: Secondary | ICD-10-CM

## 2023-10-17 DIAGNOSIS — K219 Gastro-esophageal reflux disease without esophagitis: Secondary | ICD-10-CM

## 2023-10-17 NOTE — Patient Instructions (Signed)
 We will schedule you for colonoscopy and upper endoscopy to further evaluate your symptoms.  I may elect to stretch your esophagus depending on findings.  Continue on omeprazole .  It was very nice meeting you today.  Dr. Mordechai April
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
## 2023-10-21 ENCOUNTER — Encounter: Payer: Self-pay | Admitting: *Deleted

## 2023-10-21 ENCOUNTER — Other Ambulatory Visit: Payer: Self-pay | Admitting: *Deleted

## 2023-10-21 MED ORDER — PEG 3350-KCL-NA BICARB-NACL 420 G PO SOLR: 4000.0000 mL | Freq: Once | ORAL | 0 refills | Status: AC

## 2023-10-24 ENCOUNTER — Other Ambulatory Visit: Payer: Self-pay | Admitting: Family Medicine

## 2023-10-24 ENCOUNTER — Telehealth: Payer: Self-pay

## 2023-10-24 MED ORDER — JANUMET XR 50-500 MG PO TB24
ORAL_TABLET | ORAL | 0 refills | Status: DC
Start: 2023-10-24 — End: 2024-01-14

## 2023-10-24 NOTE — Telephone Encounter (Signed)
 Pt calling stating she has been out of her Janumet  XR for the last few weeks and states the pharmacy keeps telling her we discontinued it. It looks like Janumet  XR 50/500 was discontinued on 04/03/2023 to start higher dose of Janumet  XR 100/1000 but I do not see where this was sent - not in active med list.  Please send in new rx for Janumet  XR 100/1000 to pharmacy if pt is to continue taking this.

## 2023-10-24 NOTE — Progress Notes (Signed)
 Janyuumet refilled at dose most recently on file

## 2023-10-25 NOTE — Telephone Encounter (Signed)
Second attempt, unable to lvm.

## 2023-10-25 NOTE — Telephone Encounter (Signed)
 First attempt, n/a, unable to lvm

## 2023-10-29 NOTE — Telephone Encounter (Signed)
 Unable to leave vm. Will inform at visit next week

## 2023-11-01 ENCOUNTER — Other Ambulatory Visit: Payer: Self-pay

## 2023-11-01 ENCOUNTER — Encounter (HOSPITAL_COMMUNITY): Payer: Self-pay

## 2023-11-01 ENCOUNTER — Ambulatory Visit (HOSPITAL_COMMUNITY): Payer: Self-pay | Admitting: Certified Registered Nurse Anesthetist

## 2023-11-01 ENCOUNTER — Encounter (HOSPITAL_COMMUNITY)
Admission: RE | Disposition: A | Discharge status: Home / Self Care | Payer: Self-pay | Source: Home / Self Care | Attending: Internal Medicine

## 2023-11-01 ENCOUNTER — Encounter (HOSPITAL_COMMUNITY): Payer: Self-pay | Admitting: Internal Medicine

## 2023-11-01 ENCOUNTER — Ambulatory Visit (HOSPITAL_COMMUNITY)
Admission: RE | Admit: 2023-11-01 | Discharge: 2023-11-01 | Disposition: A | Discharge status: Home / Self Care | Attending: Internal Medicine | Admitting: Internal Medicine

## 2023-11-01 ENCOUNTER — Telehealth: Payer: Self-pay | Admitting: Internal Medicine

## 2023-11-01 DIAGNOSIS — K219 Gastro-esophageal reflux disease without esophagitis: Secondary | ICD-10-CM | POA: Insufficient documentation

## 2023-11-01 DIAGNOSIS — E119 Type 2 diabetes mellitus without complications: Secondary | ICD-10-CM | POA: Insufficient documentation

## 2023-11-01 DIAGNOSIS — K648 Other hemorrhoids: Secondary | ICD-10-CM | POA: Diagnosis not present

## 2023-11-01 DIAGNOSIS — R1310 Dysphagia, unspecified: Secondary | ICD-10-CM | POA: Diagnosis not present

## 2023-11-01 DIAGNOSIS — Z6830 Body mass index (BMI) 30.0-30.9, adult: Secondary | ICD-10-CM | POA: Insufficient documentation

## 2023-11-01 DIAGNOSIS — D123 Benign neoplasm of transverse colon: Secondary | ICD-10-CM

## 2023-11-01 DIAGNOSIS — E669 Obesity, unspecified: Secondary | ICD-10-CM | POA: Diagnosis not present

## 2023-11-01 DIAGNOSIS — K449 Diaphragmatic hernia without obstruction or gangrene: Secondary | ICD-10-CM | POA: Diagnosis not present

## 2023-11-01 DIAGNOSIS — I10 Essential (primary) hypertension: Secondary | ICD-10-CM | POA: Insufficient documentation

## 2023-11-01 DIAGNOSIS — Z1211 Encounter for screening for malignant neoplasm of colon: Secondary | ICD-10-CM | POA: Diagnosis not present

## 2023-11-01 DIAGNOSIS — Z139 Encounter for screening, unspecified: Secondary | ICD-10-CM | POA: Diagnosis not present

## 2023-11-01 DIAGNOSIS — E1151 Type 2 diabetes mellitus with diabetic peripheral angiopathy without gangrene: Secondary | ICD-10-CM | POA: Diagnosis not present

## 2023-11-01 DIAGNOSIS — K5730 Diverticulosis of large intestine without perforation or abscess without bleeding: Secondary | ICD-10-CM

## 2023-11-01 DIAGNOSIS — K635 Polyp of colon: Secondary | ICD-10-CM | POA: Diagnosis not present

## 2023-11-01 DIAGNOSIS — J381 Polyp of vocal cord and larynx: Secondary | ICD-10-CM | POA: Diagnosis not present

## 2023-11-01 DIAGNOSIS — R198 Other specified symptoms and signs involving the digestive system and abdomen: Secondary | ICD-10-CM

## 2023-11-01 HISTORY — PX: ESOPHAGOGASTRODUODENOSCOPY: SHX5428

## 2023-11-01 HISTORY — PX: ESOPHAGEAL DILATION: SHX303

## 2023-11-01 HISTORY — PX: COLONOSCOPY: SHX5424

## 2023-11-01 LAB — GLUCOSE, CAPILLARY: Glucose-Capillary: 83 mg/dL (ref 70–99)

## 2023-11-01 SURGERY — COLONOSCOPY
Anesthesia: General

## 2023-11-01 MED ORDER — PROPOFOL 500 MG/50ML IV EMUL
INTRAVENOUS | Status: DC | PRN
  Administered 2023-11-01: 80 ug/kg/min via INTRAVENOUS

## 2023-11-01 MED ORDER — LACTATED RINGERS IV SOLN: INTRAVENOUS | Status: DC | PRN

## 2023-11-01 MED ORDER — PROPOFOL 500 MG/50ML IV EMUL
INTRAVENOUS | Status: AC
  Filled 2023-11-01: qty 50

## 2023-11-01 MED ORDER — LIDOCAINE 2% (20 MG/ML) 5 ML SYRINGE
INTRAMUSCULAR | Status: AC
  Filled 2023-11-01: qty 5

## 2023-11-01 MED ORDER — LIDOCAINE 2% (20 MG/ML) 5 ML SYRINGE
INTRAMUSCULAR | Status: DC | PRN
  Administered 2023-11-01: 50 mg via INTRAVENOUS

## 2023-11-01 NOTE — Telephone Encounter (Signed)
 Can we please refer this patient to ENT?  She had a polyp on her epiglottic fold on upper endoscopy today.  Please send EGD report with pictures with referral.  Thank you

## 2023-11-01 NOTE — Transfer of Care (Signed)
 Immediate Anesthesia Transfer of Care Note  Patient: Brittany Nunez  Procedure(s) Performed: COLONOSCOPY EGD (ESOPHAGOGASTRODUODENOSCOPY) DILATION, ESOPHAGUS  Patient Location: Endoscopy Unit  Anesthesia Type:General  Level of Consciousness: drowsy  Airway & Oxygen Therapy: Patient Spontanous Breathing  Post-op Assessment: Report given to RN and Post -op Vital signs reviewed and stable  Post vital signs: Reviewed and stable  Last Vitals:  Vitals Value Taken Time  BP 108/58 11/01/23 1128  Temp 36.4 C 11/01/23 1128  Pulse 90 11/01/23 1128  Resp 15 11/01/23 1128  SpO2 98 % 11/01/23 1128    Last Pain:  Vitals:   11/01/23 1128  TempSrc: Oral  PainSc:       Patients Stated Pain Goal: 7 (11/01/23 1026)  Complications: No notable events documented.

## 2023-11-01 NOTE — Interval H&P Note (Signed)
 History and Physical Interval Note:  11/01/2023 10:16 AM  Brittany Nunez  has presented today for surgery, with the diagnosis of screening, dysphagia,weight loss.  The various methods of treatment have been discussed with the patient and family. After consideration of risks, benefits and other options for treatment, the patient has consented to  Procedure(s) with comments: COLONOSCOPY (N/A) - 11:30 am, asa 2 EGD (ESOPHAGOGASTRODUODENOSCOPY) (N/A) DILATION, ESOPHAGUS (N/A) as a surgical intervention.  The patient's history has been reviewed, patient examined, no change in status, stable for surgery.  I have reviewed the patient's chart and labs.  Questions were answered to the patient's satisfaction.     [name]

## 2023-11-01 NOTE — Discharge Instructions (Addendum)
 EGD Discharge instructions Please read the instructions outlined below and refer to this sheet in the next few weeks. These discharge instructions provide you with general information on caring for yourself after you leave the hospital. Your doctor may also give you specific instructions. While your treatment has been planned according to the most current medical practices available, unavoidable complications occasionally occur. If you have any problems or questions after discharge, please call your doctor. ACTIVITY You may resume your regular activity but move at a slower pace for the next 24 hours.  Take frequent rest periods for the next 24 hours.  Walking will help expel (get rid of) the air and reduce the bloated feeling in your abdomen.  No driving for 24 hours (because of the anesthesia (medicine) used during the test).  You may shower.  Do not sign any important legal documents or operate any machinery for 24 hours (because of the anesthesia used during the test).  NUTRITION Drink plenty of fluids.  You may resume your normal diet.  Begin with a light meal and progress to your normal diet.  Avoid alcoholic beverages for 24 hours or as instructed by your caregiver.  MEDICATIONS You may resume your normal medications unless your caregiver tells you otherwise.  WHAT YOU CAN EXPECT TODAY You may experience abdominal discomfort such as a feeling of fullness or "gas" pains.  FOLLOW-UP Your doctor will discuss the results of your test with you.  SEEK IMMEDIATE MEDICAL ATTENTION IF ANY OF THE FOLLOWING OCCUR: Excessive nausea (feeling sick to your stomach) and/or vomiting.  Severe abdominal pain and distention (swelling).  Trouble swallowing.  Temperature over 101 F (37.8 C).  Rectal bleeding or vomiting of blood.      Colonoscopy Discharge Instructions  Read the instructions outlined below and refer to this sheet in the next few weeks. These discharge instructions provide you  with general information on caring for yourself after you leave the hospital. Your doctor may also give you specific instructions. While your treatment has been planned according to the most current medical practices available, unavoidable complications occasionally occur.   ACTIVITY You may resume your regular activity, but move at a slower pace for the next 24 hours.  Take frequent rest periods for the next 24 hours.  Walking will help get rid of the air and reduce the bloated feeling in your belly (abdomen).  No driving for 24 hours (because of the medicine (anesthesia) used during the test).   Do not sign any important legal documents or operate any machinery for 24 hours (because of the anesthesia used during the test).  NUTRITION Drink plenty of fluids.  You may resume your normal diet as instructed by your doctor.  Begin with a light meal and progress to your normal diet. Heavy or fried foods are harder to digest and may make you feel sick to your stomach (nauseated).  Avoid alcoholic beverages for 24 hours or as instructed.  MEDICATIONS You may resume your normal medications unless your doctor tells you otherwise.  WHAT YOU CAN EXPECT TODAY Some feelings of bloating in the abdomen.  Passage of more gas than usual.  Spotting of blood in your stool or on the toilet paper.  IF YOU HAD POLYPS REMOVED DURING THE COLONOSCOPY: No aspirin products for 7 days or as instructed.  No alcohol for 7 days or as instructed.  Eat a soft diet for the next 24 hours.  FINDING OUT THE RESULTS OF YOUR TEST Not all test results  are available during your visit. If your test results are not back during the visit, make an appointment with your caregiver to find out the results. Do not assume everything is normal if you have not heard from your caregiver or the medical facility. It is important for you to follow up on all of your test results.  SEEK IMMEDIATE MEDICAL ATTENTION IF: You have more than a  spotting of blood in your stool.  Your belly is swollen (abdominal distention).  You are nauseated or vomiting.  You have a temperature over 101.  You have abdominal pain or discomfort that is severe or gets worse throughout the day.   Your upper endoscopy revealed what appeared to be a polyp in the back your throat near your vocal cords.  You may benefit from an ENT referral.  You have a small hiatal hernia as well as a mild tightening of your esophagus which I stretched out today.  Hopefully this helps with feeling of food getting stuck.  Stomach and small bowel are normal.  Continue on omeprazole .  Your colonoscopy revealed 1 polyp(s) which I removed successfully. Await pathology results, my office will contact you. I recommend repeating colonoscopy in 7 years for surveillance purposes.   You also have diverticulosis and internal hemorrhoids. I would recommend increasing fiber in your diet or adding OTC Benefiber/Metamucil. Be sure to drink at least 4 to 6 glasses of water  daily.   Follow-up with GI in 2-3 months   I hope you have a great rest of your week!  Brittany Nunez. Brittany Nunez, D.O. Gastroenterology and Hepatology [facility]

## 2023-11-01 NOTE — Op Note (Signed)
 [facility] Patient Name: Brittany Nunez Procedure Date: 11/01/2023 10:30 AM MRN: [id number] Date of Birth: [birth year] Attending MD: Rolando Cliche. Mordechai April , [place], [id number] CSN: [id number] Age: 63 Admit Type: Outpatient Procedure:                Colonoscopy Indications:              Screening for colorectal malignant neoplasm Providers:                Rolando Cliche. Mordechai April, DO, Troy Furnish. Hazeline Lister RN, RN,                            [place] Wilson, Technician, Russ Course,                            Pensions consultant Referring MD:              Medicines:                See the Anesthesia note for documentation of the                            administered medications Complications:            No immediate complications. Estimated Blood Loss:     Estimated blood loss was minimal. Procedure:                Pre-Anesthesia Assessment:                           - The anesthesia plan was to use monitored                            anesthesia care (MAC).                           After obtaining informed consent, the colonoscope                            was passed under direct vision. Throughout the                            procedure, the patient's blood pressure, pulse, and                            oxygen saturations were monitored continuously. The                            [id number] ([id number]) was introduced through the                            anus and advanced to the the cecum, identified by                            appendiceal orifice and ileocecal valve. The                            colonoscopy was technically difficult and complex  due to a redundant colon and significant looping.                            Successful completion of the procedure was aided by                            changing the patient to a supine position and                            applying abdominal pressure. The patient tolerated                            the procedure well.  The quality of the bowel                            preparation was evaluated using the BBPS [facility]                            Bowel Preparation Scale) with scores of: Right                            Colon = 3, Transverse Colon = 3 and Left Colon = 3                            (entire mucosa seen well with no residual staining,                            small fragments of stool or opaque liquid). The                            total BBPS score equals 9. Scope In: 10:57:32 AM Scope Out: 11:25:00 AM Scope Withdrawal Time: 0 hours 14 minutes 7 seconds  Total Procedure Duration: 0 hours 27 minutes 28 seconds  Findings:      Non-bleeding internal hemorrhoids were found.      Many large-mouthed and small-mouthed diverticula were found in the       entire colon.      A 5 mm polyp was found in the transverse colon. The polyp was sessile.       The polyp was removed with a cold snare. Resection and retrieval were       complete.      The exam was otherwise without abnormality. Impression:               - Non-bleeding internal hemorrhoids.                           - Diverticulosis in the entire examined colon.                           - One 5 mm polyp in the transverse colon, removed                            with a cold snare. Resected and retrieved.                           -  The examination was otherwise normal. Moderate Sedation:      Per Anesthesia Care Recommendation:           - Patient has a contact number available for                            emergencies. The signs and symptoms of potential                            delayed complications were discussed with the                            patient. Return to normal activities tomorrow.                            Written discharge instructions were provided to the                            patient.                           - Resume previous diet.                           - Continue present medications.                            - Await pathology results.                           - Repeat colonoscopy in 7 years for surveillance.                           - Return to GI clinic in 3 months. Procedure Code(s):        --- Professional ---                           [phone], Colonoscopy, flexible; with removal of                            tumor(s), polyp(s), or other lesion(s) by snare                            technique Diagnosis Code(s):        --- Professional ---                           Z12.11, Encounter for screening for malignant                            neoplasm of colon                           K64.8, Other hemorrhoids                           D12.3, Benign neoplasm of transverse colon (hepatic  flexure or splenic flexure)                           K57.30, Diverticulosis of large intestine without                            perforation or abscess without bleeding CPT copyright 2022 American Medical Association. All rights reserved. The codes documented in this report are preliminary and upon coder review may  be revised to meet current compliance requirements. Rolando Cliche. Mordechai April, DO Rolando Cliche. Mordechai April, DO 11/01/2023 11:36:42 AM This report has been signed electronically. Number of Addenda: 0

## 2023-11-01 NOTE — Anesthesia Preprocedure Evaluation (Signed)
 Anesthesia Evaluation  Patient identified by MRN, date of birth, ID band Patient awake    Reviewed: Allergy & Precautions, H&P , NPO status , Patient's Chart, lab work & pertinent test results, reviewed documented beta blocker date and time   Airway Mallampati: II  TM Distance: >3 FB Neck ROM: full    Dental no notable dental hx.    Pulmonary neg pulmonary ROS   Pulmonary exam normal breath sounds clear to auscultation       Cardiovascular Exercise Tolerance: Good hypertension, + Peripheral Vascular Disease   Rhythm:regular Rate:Normal     Neuro/Psych  Headaches PSYCHIATRIC DISORDERS Anxiety Depression     Neuromuscular disease    GI/Hepatic Neg liver ROS,GERD  ,,  Endo/Other  diabetes    Renal/GU Renal disease  negative genitourinary   Musculoskeletal   Abdominal   Peds  Hematology  (+) Blood dyscrasia, anemia   Anesthesia Other Findings   Reproductive/Obstetrics negative OB ROS                             Anesthesia Physical Anesthesia Plan  ASA: 2  Anesthesia Plan: General   Post-op Pain Management:    Induction:   PONV Risk Score and Plan: Propofol  infusion  Airway Management Planned:   Additional Equipment:   Intra-op Plan:   Post-operative Plan:   Informed Consent: I have reviewed the patients History and Physical, chart, labs and discussed the procedure including the risks, benefits and alternatives for the proposed anesthesia with the patient or authorized representative who has indicated his/her understanding and acceptance.     Dental Advisory Given  Plan Discussed with: CRNA  Anesthesia Plan Comments:        Anesthesia Quick Evaluation

## 2023-11-01 NOTE — Anesthesia Postprocedure Evaluation (Signed)
 Anesthesia Post Note  Patient: Brittany Nunez  Procedure(s) Performed: COLONOSCOPY EGD (ESOPHAGOGASTRODUODENOSCOPY) DILATION, ESOPHAGUS  Patient location during evaluation: Phase II Anesthesia Type: General Level of consciousness: awake Pain management: pain level controlled Vital Signs Assessment: post-procedure vital signs reviewed and stable Respiratory status: spontaneous breathing and respiratory function stable Cardiovascular status: blood pressure returned to baseline and stable Postop Assessment: no headache and no apparent nausea or vomiting Anesthetic complications: no Comments: Late entry   No notable events documented.   Last Vitals:  Vitals:   11/01/23 1128 11/01/23 1133  BP: (!) 108/58 (!) 113/55  Pulse: 90   Resp: 15   Temp: (!) 36.4 C   SpO2: 98%     Last Pain:  Vitals:   11/01/23 1133  TempSrc:   PainSc: 0-No pain                 Coretha Dew

## 2023-11-01 NOTE — Op Note (Signed)
 [facility] Patient Name: Brittany Nunez Procedure Date: 11/01/2023 10:33 AM MRN: [id number] Date of Birth: [birth year] Attending MD: Rolando Cliche. Mordechai April , [place], [id number] CSN: [id number] Age: 63 Admit Type: Outpatient Procedure:                Upper GI endoscopy Indications:              Dysphagia Providers:                Rolando Cliche. Mordechai April, DO, Troy Furnish. Hazeline Lister RN, RN,                            [place] Wilson, Technician, Russ Course,                            Pensions consultant Referring MD:              Medicines:                See the Anesthesia note for documentation of the                            administered medications Complications:            No immediate complications. Estimated Blood Loss:     Estimated blood loss was minimal. Procedure:                Pre-Anesthesia Assessment:                           - The anesthesia plan was to use monitored                            anesthesia care (MAC).                           After obtaining informed consent, the endoscope was                            passed under direct vision. Throughout the                            procedure, the patient's blood pressure, pulse, and                            oxygen saturations were monitored continuously. The                            GIF-H190 ([id number]) scope was introduced through the                            mouth, and advanced to the second part of duodenum.                            The upper GI endoscopy was accomplished without                            difficulty. The patient tolerated the procedure  well. Scope In: 10:49:41 AM Scope Out: 10:54:21 AM Total Procedure Duration: 0 hours 4 minutes 40 seconds  Findings:      A sessile polyp was found on the epiglotic fold (see pictures). The       polyp is non-obstructing the airway.      The Z-line was regular.      No endoscopic abnormality was evident in the esophagus to explain the        patient's complaint of dysphagia. Preparations were made for empiric       dilation. A TTS dilator was passed through the scope. Dilation with an       18-19-20 mm balloon dilator was performed to 20 mm. Dilation was       performed with a mild resistance at 20 mm. Estimated blood loss was none.      The entire examined stomach was normal.      The duodenal bulb, first portion of the duodenum and second portion of       the duodenum were normal.      A small hiatal hernia was present. Impression:               - A polyp was found on the epiglottis.                           - Z-line regular.                           - Normal stomach.                           - Normal duodenal bulb, first portion of the                            duodenum and second portion of the duodenum.                           - Small hiatal hernia.                           - No specimens collected. Moderate Sedation:      Per Anesthesia Care Recommendation:           - Patient has a contact number available for                            emergencies. The signs and symptoms of potential                            delayed complications were discussed with the                            patient. Return to normal activities tomorrow.                            Written discharge instructions were provided to the                            patient.                           -  Resume previous diet.                           - Continue present medications.                           - Repeat upper endoscopy PRN for retreatment.                           - Return to GI clinic in 3 months.                           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 All rights reserved. The codes documented in this report are preliminary and upon coder review may  be revised to meet current compliance requirements. Rolando Cliche. Mordechai April, DO Rolando Cliche. Mordechai April, DO 11/01/2023 11:42:08 AM This report has been signed electronically. Number of Addenda: 0

## 2023-11-04 ENCOUNTER — Other Ambulatory Visit: Payer: Self-pay | Admitting: Family Medicine

## 2023-11-04 ENCOUNTER — Encounter (HOSPITAL_COMMUNITY): Payer: Self-pay | Admitting: Internal Medicine

## 2023-11-04 LAB — SURGICAL PATHOLOGY

## 2023-11-04 NOTE — Telephone Encounter (Signed)
 Referral faxed

## 2023-11-04 NOTE — Addendum Note (Signed)
 Addended by: Feliz Hosteller on: 11/04/2023 07:55 AM   Modules accepted: Orders

## 2023-11-08 ENCOUNTER — Ambulatory Visit (INDEPENDENT_AMBULATORY_CARE_PROVIDER_SITE_OTHER): Admitting: Family Medicine

## 2023-11-08 ENCOUNTER — Encounter: Payer: Self-pay | Admitting: Family Medicine

## 2023-11-08 VITALS — BP 123/79 | HR 93 | Resp 16 | Ht 66.0 in | Wt 190.0 lb

## 2023-11-08 DIAGNOSIS — F5101 Primary insomnia: Secondary | ICD-10-CM

## 2023-11-08 DIAGNOSIS — J309 Allergic rhinitis, unspecified: Secondary | ICD-10-CM | POA: Diagnosis not present

## 2023-11-08 DIAGNOSIS — E1159 Type 2 diabetes mellitus with other circulatory complications: Secondary | ICD-10-CM

## 2023-11-08 DIAGNOSIS — Z23 Encounter for immunization: Secondary | ICD-10-CM

## 2023-11-08 DIAGNOSIS — E785 Hyperlipidemia, unspecified: Secondary | ICD-10-CM | POA: Diagnosis not present

## 2023-11-08 DIAGNOSIS — E66811 Obesity, class 1: Secondary | ICD-10-CM | POA: Diagnosis not present

## 2023-11-08 DIAGNOSIS — Z1231 Encounter for screening mammogram for malignant neoplasm of breast: Secondary | ICD-10-CM | POA: Diagnosis not present

## 2023-11-08 DIAGNOSIS — E1169 Type 2 diabetes mellitus with other specified complication: Secondary | ICD-10-CM | POA: Diagnosis not present

## 2023-11-08 DIAGNOSIS — I10 Essential (primary) hypertension: Secondary | ICD-10-CM | POA: Diagnosis not present

## 2023-11-08 DIAGNOSIS — M5442 Lumbago with sciatica, left side: Secondary | ICD-10-CM

## 2023-11-08 DIAGNOSIS — M5441 Lumbago with sciatica, right side: Secondary | ICD-10-CM | POA: Diagnosis not present

## 2023-11-08 DIAGNOSIS — G8929 Other chronic pain: Secondary | ICD-10-CM

## 2023-11-08 NOTE — Assessment & Plan Note (Signed)
 Hyperlipidemia:Low fat diet discussed and encouraged.   Lipid Panel  Lab Results  Component Value Date   CHOL 121 04/01/2023   HDL 42 04/01/2023   LDLCALC 63 04/01/2023   TRIG 79 04/01/2023   CHOLHDL 2.9 04/01/2023     Updated lab needed at/ before next visit.

## 2023-11-08 NOTE — Assessment & Plan Note (Signed)
 Diabetes associated with hypertension, hyperlipidemia, and depression  Brittany Nunez is reminded of the importance of commitment to daily physical activity for 30 minutes or more, as able and the need to limit carbohydrate intake to 30 to 60 grams per meal to help with blood sugar control.   The need to take medication as prescribed, test blood sugar as directed, and to call between visits if there is a concern that blood sugar is uncontrolled is also discussed.   Brittany Nunez is reminded of the importance of daily foot exam, annual eye examination, and good blood sugar, blood pressure and cholesterol control.     Latest Ref Rng & Units 09/06/2023    4:44 PM 07/18/2023    4:01 PM 04/01/2023    8:40 AM 03/29/2023    4:30 PM 03/01/2023    4:01 PM  Diabetic Labs  HbA1c 4.8 - 5.6 %  6.5  7.4     Micro/Creat Ratio 0 - 29 mg/g creat    <3    Chol 100 - 199 mg/dL   161     HDL >09 mg/dL   42     Calc LDL 0 - 99 mg/dL   63     Triglycerides 0 - 149 mg/dL   79     Creatinine 6.04 - 1.00 mg/dL 5.40  9.81  1.91   4.78       11/08/2023    3:45 PM 11/01/2023   11:33 AM 11/01/2023   11:28 AM 11/01/2023   10:26 AM 10/17/2023    1:19 PM 09/06/2023    3:23 PM 07/18/2023    4:14 PM  BP/Weight  Systolic BP 123 113 108 141 127 123   Diastolic BP 79 55 58 84 75 82   Wt. (Lbs) 190.04   185 187.8 186 195  BMI 30.67 kg/m2   28.98 kg/m2 30.31 kg/m2 30.02 kg/m2 31.47 kg/m2      Latest Ref Rng & Units 04/17/2023   12:00 AM 04/17/2022   12:00 AM  Foot/eye exam completion dates  Eye Exam No Retinopathy No Retinopathy     No Retinopathy         This result is from an external source.      Updated lab needed at/ before next visit.

## 2023-11-08 NOTE — Patient Instructions (Addendum)
 Annual exam in office with mD in 4 months, call if you need me sooner  Pneumonia 20 today   Please schedule AWV at check out   Labs today  we will call with result per your request so , pls listen for call  Pls schedule mammogram at checkout   Thanks for choosing [facility], we consider it a privelige to serve you.

## 2023-11-09 ENCOUNTER — Ambulatory Visit: Payer: Self-pay | Admitting: Family Medicine

## 2023-11-09 LAB — LIPID PANEL W/O CHOL/HDL RATIO
Cholesterol, Total: 116 mg/dL (ref 100–199)
HDL: 39 mg/dL — ABNORMAL LOW (ref >39)
LDL Chol Calc (NIH): 63 mg/dL (ref 0–99)
Triglycerides: 68 mg/dL (ref 0–149)
VLDL Cholesterol Cal: 14 mg/dL (ref 5–40)

## 2023-11-09 LAB — CMP14+EGFR
ALT: 13 IU/L (ref 0–32)
AST: 17 IU/L (ref 0–40)
Albumin: 4 g/dL (ref 3.9–4.9)
Alkaline Phosphatase: 93 IU/L (ref 44–121)
BUN/Creatinine Ratio: 13 (ref 12–28)
BUN: 13 mg/dL (ref 8–27)
Bilirubin Total: <0.2 mg/dL (ref 0.0–1.2)
CO2: 24 mmol/L (ref 20–29)
Calcium: 9 mg/dL (ref 8.7–10.3)
Chloride: 105 mmol/L (ref 96–106)
Creatinine, Ser: 0.98 mg/dL (ref 0.57–1.00)
Globulin, Total: 2.7 g/dL (ref 1.5–4.5)
Glucose: 105 mg/dL — ABNORMAL HIGH (ref 70–99)
Potassium: 4.3 mmol/L (ref 3.5–5.2)
Sodium: 142 mmol/L (ref 134–144)
Total Protein: 6.7 g/dL (ref 6.0–8.5)
eGFR: 64 mL/min/{1.73_m2} (ref >59)

## 2023-11-09 LAB — HEMOGLOBIN A1C
Est. average glucose Bld gHb Est-mCnc: 143 mg/dL
Hgb A1c MFr Bld: 6.6 % — ABNORMAL HIGH (ref 4.8–5.6)

## 2023-11-10 ENCOUNTER — Encounter: Payer: Self-pay | Admitting: Family Medicine

## 2023-11-10 DIAGNOSIS — Z1231 Encounter for screening mammogram for malignant neoplasm of breast: Secondary | ICD-10-CM | POA: Insufficient documentation

## 2023-11-10 DIAGNOSIS — G4700 Insomnia, unspecified: Secondary | ICD-10-CM | POA: Insufficient documentation

## 2023-11-10 DIAGNOSIS — Z23 Encounter for immunization: Secondary | ICD-10-CM | POA: Insufficient documentation

## 2023-11-10 NOTE — Assessment & Plan Note (Signed)
 Controlled, no change in medication DASH diet and commitment to daily physical activity for a minimum of 30 minutes discussed and encouraged, as a part of hypertension management. The importance of attaining a healthy weight is also discussed.     11/08/2023    3:45 PM 11/01/2023   11:33 AM 11/01/2023   11:28 AM 11/01/2023   10:26 AM 10/17/2023    1:19 PM 09/06/2023    3:23 PM 07/18/2023    4:14 PM  BP/Weight  Systolic BP 123 113 108 141 127 123   Diastolic BP 79 55 58 84 75 82   Wt. (Lbs) 190.04   185 187.8 186 195  BMI 30.67 kg/m2   28.98 kg/m2 30.31 kg/m2 30.02 kg/m2 31.47 kg/m2

## 2023-11-10 NOTE — Assessment & Plan Note (Signed)
 Controlled, no change in medication

## 2023-11-10 NOTE — Assessment & Plan Note (Signed)
  Patient re-educated about  the importance of commitment to a  minimum of 150 minutes of exercise per week as able.  The importance of healthy food choices with portion control discussed, as well as eating regularly and within a 12 hour window most days. The need to choose "clean , green" food 50 to 75% of the time is discussed, as well as to make water  the primary drink and set a goal of 64 ounces water  daily.       11/08/2023    3:45 PM 11/01/2023   10:26 AM 10/17/2023    1:19 PM  Weight /BMI  Weight 190 lb 0.6 oz 185 lb 187 lb 12.8 oz  Height 5\' 6"  (1.676 m) 5\' 7"  (1.702 m) 5\' 6"  (1.676 m)  BMI 30.67 kg/m2 28.98 kg/m2 30.31 kg/m2

## 2023-11-10 NOTE — Assessment & Plan Note (Signed)
 Order placed appt to be scheduled

## 2023-11-10 NOTE — Progress Notes (Signed)
 Brittany Nunez     MRN: [id number]      DOB: February 03, 1961  Chief Complaint  Patient presents with   Medical Management of Chronic Issues    2 month follow up. Discuss colonoscopy results     HPI Brittany Nunez is here for follow up and re-evaluation of chronic medical conditions, medication management and review of any available recent lab and radiology data.  Preventive health is updated, specifically  Cancer screening and Immunization.   Questions or concerns regarding consultations or procedures which the PT has had in the interim are  addressed. The PT denies any adverse reactions to current medications since the last visit.  There are no new concerns.  There are no specific complaints   ROS Denies recent fever or chills. Denies sinus pressure, nasal congestion, ear pain or sore throat. Denies chest congestion, productive cough or wheezing. Denies chest pains, palpitations and leg swelling Denies abdominal pain, nausea, vomiting,diarrhea or constipation.   Denies dysuria, frequency, hesitancy or incontinence. Denies joint pain, swelling and limitation in mobility. Denies headaches, seizures, numbness, or tingling. Denies depression, anxiety or insomnia. Denies skin break down or rash.   PE  BP 123/79   Pulse 93   Resp 16   Ht 5\' 6"  (1.676 m)   Wt 190 lb 0.6 oz (86.2 kg)   SpO2 95%   BMI 30.67 kg/m   Patient alert and oriented and in no cardiopulmonary distress.  HEENT: No facial asymmetry, EOMI,     Neck supple .  Chest: Clear to auscultation bilaterally.  CVS: S1, S2 no murmurs, no S3.Regular rate.  ABD: Soft non tender.   Ext: No edema  MS: Adequate ROM spine, shoulders, hips and knees.  Skin: Intact, no ulcerations or rash noted.  Psych: Good eye contact, normal affect. Memory intact not anxious or depressed appearing.  CNS: CN 2-12 intact, power,  normal throughout.no focal deficits noted.   Assessment & Plan  Hyperlipidemia associated with type 2  diabetes mellitus (HCC) Hyperlipidemia:Low fat diet discussed and encouraged.   Lipid Panel  Lab Results  Component Value Date   CHOL 121 04/01/2023   HDL 42 04/01/2023   LDLCALC 63 04/01/2023   TRIG 79 04/01/2023   CHOLHDL 2.9 04/01/2023     Updated lab needed at/ before next visit.   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   The need to take medication as prescribed, test blood sugar as directed, and to call between visits if there is a concern that blood sugar is uncontrolled is also discussed.   Brittany Nunez is reminded of the importance of daily foot exam, annual eye examination, and good blood sugar, blood pressure and cholesterol control.     Latest Ref Rng & Units 09/06/2023    4:44 PM 07/18/2023    4:01 PM 04/01/2023    8:40 AM 03/29/2023    4:30 PM 03/01/2023    4:01 PM  Diabetic Labs  HbA1c 4.8 - 5.6 %  6.5  7.4     Micro/Creat Ratio 0 - 29 mg/g creat    <3    Chol 100 - 199 mg/dL   782     HDL >95 mg/dL  42     Calc LDL 0 - 99 mg/dL   63     Triglycerides 0 - 149 mg/dL   79     Creatinine 6.96 - 1.00 mg/dL 2.95  2.84  1.32   4.40       11/08/2023    3:45 PM 11/01/2023   11:33 AM 11/01/2023   11:28 AM 11/01/2023   10:26 AM 10/17/2023    1:19 PM 09/06/2023    3:23 PM 07/18/2023    4:14 PM  BP/Weight  Systolic BP 123 113 108 141 127 123   Diastolic BP 79 55 58 84 75 82   Wt. (Lbs) 190.04   185 187.8 186 195  BMI 30.67 kg/m2   28.98 kg/m2 30.31 kg/m2 30.02 kg/m2 31.47 kg/m2      Latest Ref Rng & Units 04/17/2023   12:00 AM 04/17/2022   12:00 AM  Foot/eye exam completion dates  Eye Exam No Retinopathy No Retinopathy     No Retinopathy         This result is from an  external source.      Updated lab needed at/ before next visit.   Essential hypertension Controlled, no change in medication DASH diet and commitment to daily physical activity for a minimum of 30 minutes discussed and encouraged, as a part of hypertension management. The importance of attaining a healthy weight is also discussed.     11/08/2023    3:45 PM 11/01/2023   11:33 AM 11/01/2023   11:28 AM 11/01/2023   10:26 AM 10/17/2023    1:19 PM 09/06/2023    3:23 PM 07/18/2023    4:14 PM  BP/Weight  Systolic BP 123 113 108 141 127 123   Diastolic BP 79 55 58 84 75 82   Wt. (Lbs) 190.04   185 187.8 186 195  BMI 30.67 kg/m2   28.98 kg/m2 30.31 kg/m2 30.02 kg/m2 31.47 kg/m2       Obesity (BMI 30.0-34.9)  Patient re-educated about  the importance of commitment to a  minimum of 150 minutes of exercise per week as able.  The importance of healthy food choices with portion control discussed, as well as eating regularly and within a 12 hour window most days. The need to choose "clean , green" food 50 to 75% of the time is discussed, as well as to make water  the primary drink and set a goal of 64 ounces water  daily.       11/08/2023    3:45 PM 11/01/2023   10:26 AM 10/17/2023    1:19 PM  Weight /BMI  Weight 190 lb 0.6 oz 185 lb 187 lb 12.8 oz  Height 5\' 6"  (1.676 m) 5\' 7"  (1.702 m) 5\' 6"  (1.676 m)  BMI 30.67 kg/m2 28.98 kg/m2 30.31 kg/m2      Encounter for immunization After obtaining informed consent, the  pneumonia 20 vaccine is  administered , with no adverse effect noted at the time of administration.   Allergic sinusitis Controlled, no change in medication   Depression, major, single episode, in partial remission (HCC) Controlled, no change in medication   Back pain Controlled, no change in medication   Insomnia Controlled, no change in medication   Screening mammogram for breast cancer Order placed appt to be scheduled

## 2023-11-10 NOTE — Assessment & Plan Note (Signed)
 After obtaining informed consent, the pneumonia 20  vaccine is  administered , with no adverse effect noted at the time of administration.

## 2023-11-14 ENCOUNTER — Ambulatory Visit: Payer: Self-pay | Admitting: Internal Medicine

## 2023-11-19 ENCOUNTER — Encounter (INDEPENDENT_AMBULATORY_CARE_PROVIDER_SITE_OTHER): Payer: Self-pay | Admitting: Otolaryngology

## 2023-11-26 ENCOUNTER — Other Ambulatory Visit: Payer: Self-pay | Admitting: Family Medicine

## 2023-11-26 ENCOUNTER — Encounter (INDEPENDENT_AMBULATORY_CARE_PROVIDER_SITE_OTHER): Payer: Self-pay

## 2023-12-10 ENCOUNTER — Other Ambulatory Visit: Payer: Self-pay | Admitting: Family Medicine

## 2024-01-14 ENCOUNTER — Other Ambulatory Visit: Payer: Self-pay | Admitting: Family Medicine

## 2024-01-15 ENCOUNTER — Other Ambulatory Visit: Payer: Self-pay | Admitting: Family Medicine

## 2024-01-17 ENCOUNTER — Encounter: Payer: Self-pay | Admitting: Gastroenterology

## 2024-01-21 ENCOUNTER — Other Ambulatory Visit: Payer: Self-pay | Admitting: Family Medicine

## 2024-01-21 DIAGNOSIS — E1169 Type 2 diabetes mellitus with other specified complication: Secondary | ICD-10-CM

## 2024-01-30 ENCOUNTER — Other Ambulatory Visit: Payer: Self-pay

## 2024-01-30 MED ORDER — VENLAFAXINE HCL ER 75 MG PO CP24: 75.0000 mg | ORAL_CAPSULE | Freq: Every day | ORAL | 1 refills | Status: AC

## 2024-02-05 ENCOUNTER — Ambulatory Visit (INDEPENDENT_AMBULATORY_CARE_PROVIDER_SITE_OTHER): Admitting: Family Medicine

## 2024-02-05 ENCOUNTER — Encounter: Payer: Self-pay | Admitting: Family Medicine

## 2024-02-05 VITALS — BP 125/85 | HR 101 | Resp 16 | Ht 67.0 in | Wt 185.0 lb

## 2024-02-05 DIAGNOSIS — Z0001 Encounter for general adult medical examination with abnormal findings: Secondary | ICD-10-CM

## 2024-02-05 DIAGNOSIS — E1159 Type 2 diabetes mellitus with other circulatory complications: Secondary | ICD-10-CM | POA: Diagnosis not present

## 2024-02-05 DIAGNOSIS — E559 Vitamin D deficiency, unspecified: Secondary | ICD-10-CM

## 2024-02-05 DIAGNOSIS — I10 Essential (primary) hypertension: Secondary | ICD-10-CM | POA: Diagnosis not present

## 2024-02-05 DIAGNOSIS — Z0000 Encounter for general adult medical examination without abnormal findings: Secondary | ICD-10-CM

## 2024-02-05 MED ORDER — MONTELUKAST SODIUM 10 MG PO TABS: 10.0000 mg | ORAL_TABLET | Freq: Every day | ORAL | 5 refills | Status: AC

## 2024-02-05 NOTE — Patient Instructions (Addendum)
 F/U in 6 months  First week in Nov, please get fasting lipid, cmp and eGFR, HBA1c, microalb, TSH, and Vit D  Covid an flu vaccines needed   Keep up great health habits  It is important that you exercise regularly at least 30 minutes 5 times a week. If you develop chest pain, have severe difficulty breathing, or feel very tired, stop exercising immediately and seek medical attention    Thanks for choosing [facility], we consider it a privelige to serve you.

## 2024-02-05 NOTE — Assessment & Plan Note (Signed)
Annual exam as documented.  Changes in health habits are decided on by the patient with goals and time frames  set for achieving them. Immunization and cancer screening needs are specifically addressed at this visit.  

## 2024-02-05 NOTE — Progress Notes (Signed)
    Brittany Nunez     MRN: [id number]      DOB: [birth year]  Chief Complaint  Patient presents with   Annual Exam    Cpe     HPI: Patient is in for annual physical exam. No other health concerns are expressed or addressed at the visit. Recent labs,  are reviewed. Immunization is reviewed , and  updated if needed.   PE: BP 125/85   Pulse (!) 101   Resp 16   Ht 5' 7 (1.702 m)   Wt 185 lb (83.9 kg)   SpO2 94%   BMI 28.98 kg/m   Pleasant  female, alert and oriented x 3, in no cardio-pulmonary distress. Afebrile. HEENT No facial trauma or asymetry. Sinuses non tender.  Extra occullar muscles intact.. External ears normal, . Neck: supple, no adenopathy,JVD or thyromegaly.No bruits.  Chest: Clear to ascultation bilaterally.No crackles or wheezes. Non tender to palpation  Cardiovascular system; Heart sounds normal,  S1 and  S2 ,no S3.  No murmur, or thrill. Apical beat not displaced Peripheral pulses normal.  Abdomen: Soft, non tender, no organomegaly or masses. No guarding, tenderness or rebound.    Musculoskeletal exam: Full ROM of spine, hips , shoulders and knees. No deformity ,swelling or crepitus noted. No muscle wasting or atrophy.   Neurologic: Cranial nerves 2 to 12 intact. Power, tone ,sensation and reflexes normal throughout. No disturbance in gait. No tremor.  Skin: Intact, no ulceration, erythema , scaling or rash noted. Pigmentation normal throughout  Psych; Normal mood and affect. Judgement and concentration normal   Assessment & Plan:  Annual physical exam Annual exam as documented.  Changes in health habits are decided on by the patient with goals and time frames  set for achieving them. Immunization and cancer screening needs are specifically addressed at this visit.

## 2024-02-06 ENCOUNTER — Encounter: Payer: Self-pay | Admitting: Family Medicine

## 2024-02-13 ENCOUNTER — Encounter (INDEPENDENT_AMBULATORY_CARE_PROVIDER_SITE_OTHER): Payer: Self-pay | Admitting: Otolaryngology

## 2024-02-13 ENCOUNTER — Ambulatory Visit (INDEPENDENT_AMBULATORY_CARE_PROVIDER_SITE_OTHER): Admitting: Otolaryngology

## 2024-02-13 VITALS — BP 121/76 | HR 118

## 2024-02-13 DIAGNOSIS — J387 Other diseases of larynx: Secondary | ICD-10-CM

## 2024-02-13 DIAGNOSIS — K219 Gastro-esophageal reflux disease without esophagitis: Secondary | ICD-10-CM

## 2024-02-13 DIAGNOSIS — R1319 Other dysphagia: Secondary | ICD-10-CM
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
## 2024-02-13 NOTE — Patient Instructions (Signed)

## 2024-02-18 ENCOUNTER — Other Ambulatory Visit: Payer: Self-pay | Admitting: Family Medicine

## 2024-02-25 ENCOUNTER — Ambulatory Visit (INDEPENDENT_AMBULATORY_CARE_PROVIDER_SITE_OTHER)

## 2024-02-25 VITALS — Ht 67.0 in | Wt 185.0 lb

## 2024-02-25 DIAGNOSIS — Z0000 Encounter for general adult medical examination without abnormal findings: Secondary | ICD-10-CM | POA: Diagnosis not present
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
## 2024-02-25 NOTE — Patient Instructions (Signed)
 Brittany Nunez , Thank you for taking time out of your busy schedule to complete your Annual Wellness Visit with me. I enjoyed our conversation and look forward to speaking with you again next year. I, as well as your care team,  appreciate your ongoing commitment to your health goals. Please review the following plan we discussed and let me know if I can assist you in the future.  Your Game plan/ To Do List   Follow up Visits: We will see or speak with you next year for your Next Medicare AWV with our clinical staff   Clinician Recommendations:  Aim for 30 minutes of exercise or brisk walking, 6-8 glasses of water , and 5 servings of fruits and vegetables each day.    Wishing you many blessings and good health during the next year until our next visit.  -Yazen Rosko   This is a list of the screenings recommended for you:  Health Maintenance  Topic Date Due   Flu Shot  01/24/2024   DTaP/Tdap/Td vaccine (3 - Td or Tdap) 02/11/2024   COVID-19 Vaccine (4 - 2025-26 season) 02/24/2024   Yearly kidney health urinalysis for diabetes  03/28/2024   Complete foot exam   03/28/2024   Eye exam for diabetics  04/16/2024   Hemoglobin A1C  05/10/2024   Cologuard (Stool DNA test)  10/06/2024   Yearly kidney function blood test for diabetes  11/07/2024   Medicare Annual Wellness Visit  02/24/2025   Mammogram  02/28/2025   Pap with HPV screening  10/20/2026   Pneumococcal Vaccine for age over 72  Completed   Hepatitis C Screening  Completed   HIV Screening  Completed   Zoster (Shingles) Vaccine  Completed   Hepatitis B Vaccine  Aged Out   HPV Vaccine  Aged Out   Meningitis B Vaccine  Aged Out   Colon Cancer Screening  Discontinued    Advanced directives: (Declined) Advance directive discussed with you today. Even though you declined this today, please call our office should you change your mind, and we can give you the proper paperwork for you to fill out. Advance Care Planning is important because  it:  [x]  Makes sure you receive the medical care that is consistent with your values, goals, and preferences  [x]  It provides guidance to your family and loved ones and reduces their decisional burden about whether or not they are making the right decisions based on your wishes.  Follow the link provided in your after visit summary or read over the paperwork we have mailed to you to help you started getting your Advance Directives in place. If you need assistance in completing these, please reach out to us  so that we can help you!  See attachments for Preventive Care and Fall Prevention Tips.

## 2024-03-05 ENCOUNTER — Ambulatory Visit (INDEPENDENT_AMBULATORY_CARE_PROVIDER_SITE_OTHER): Admitting: Gastroenterology

## 2024-03-05 VITALS — BP 103/70 | HR 88 | Temp 98.6°F | Ht 66.0 in | Wt 189.0 lb

## 2024-03-05 DIAGNOSIS — Z8719 Personal history of other diseases of the digestive system: Secondary | ICD-10-CM | POA: Diagnosis not present

## 2024-03-05 DIAGNOSIS — K219 Gastro-esophageal reflux disease without esophagitis: Secondary | ICD-10-CM

## 2024-03-05 DIAGNOSIS — R1310 Dysphagia, unspecified: Secondary | ICD-10-CM | POA: Insufficient documentation
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
## 2024-03-05 NOTE — Patient Instructions (Addendum)
 I am glad you are doing well!  Please call us  with any concerns or difficulty swallowing!  Your next colonoscopy will be in 2037.  Happy early birthday!  It was a pleasure to see you today. I want to create trusting relationships with patients and provide genuine, compassionate, and quality care. I truly value your feedback, so please be on the lookout for a survey regarding your visit with me today. I appreciate your time in completing this!         Therisa MICAEL Stager, PhD, ANP-BC [facility]

## 2024-03-16 ENCOUNTER — Other Ambulatory Visit: Payer: Self-pay | Admitting: Family Medicine

## 2024-03-20 ENCOUNTER — Encounter: Admitting: Family Medicine

## 2024-03-23 ENCOUNTER — Encounter (HOSPITAL_COMMUNITY): Payer: Self-pay

## 2024-03-23 ENCOUNTER — Ambulatory Visit (HOSPITAL_COMMUNITY)
Admission: RE | Admit: 2024-03-23 | Discharge: 2024-03-23 | Disposition: A | Discharge status: Home / Self Care | Source: Ambulatory Visit | Attending: Family Medicine | Admitting: Family Medicine

## 2024-03-23 DIAGNOSIS — Z1231 Encounter for screening mammogram for malignant neoplasm of breast: Secondary | ICD-10-CM | POA: Diagnosis not present

## 2024-03-31 ENCOUNTER — Other Ambulatory Visit: Payer: Self-pay | Admitting: Family Medicine

## 2024-04-10 ENCOUNTER — Encounter: Payer: Self-pay | Admitting: Family Medicine

## 2024-04-13 ENCOUNTER — Other Ambulatory Visit: Payer: Self-pay | Admitting: Family Medicine

## 2024-04-21 ENCOUNTER — Other Ambulatory Visit: Payer: Self-pay | Admitting: Family Medicine

## 2024-04-28 ENCOUNTER — Other Ambulatory Visit: Payer: Self-pay | Admitting: Internal Medicine

## 2024-05-03 ENCOUNTER — Other Ambulatory Visit: Payer: Self-pay | Admitting: Family Medicine

## 2024-05-03 DIAGNOSIS — E1169 Type 2 diabetes mellitus with other specified complication: Secondary | ICD-10-CM

## 2024-05-18 ENCOUNTER — Other Ambulatory Visit: Payer: Self-pay | Admitting: Family Medicine

## 2024-05-18 MED ORDER — TEMAZEPAM 30 MG PO CAPS: 30.0000 mg | ORAL_CAPSULE | Freq: Every day | ORAL | 0 refills | Status: DC

## 2024-05-18 NOTE — Telephone Encounter (Signed)
 Copied from CRM #[id number]. Topic: Clinical - Medication Refill >> May 18, 2024  9:40 AM Rea C wrote: Medication: temazepam  (RESTORIL ) 30 MG capsule  Has the patient contacted their pharmacy? Yes and they said that they sent over two requests and haven't received anything from the clinic.   This is the patient's preferred pharmacy:  [facility] [address] [place] - [address] [place] [address] [place] [address] Phone: [phone] Fax: [phone]  Is this the correct pharmacy for this prescription? Yes If no, delete pharmacy and type the correct one.   Has the prescription been filled recently? Yes  Is the patient out of the medication? Yes completely out   Has the patient been seen for an appointment in the last year OR does the patient have an upcoming appointment? Yes  Can we respond through MyChart? Yes  Agent: Please be advised that Rx refills may take up to 3 business days. We ask that you follow-up with your pharmacy.

## 2024-05-20 DIAGNOSIS — E1159 Type 2 diabetes mellitus with other circulatory complications: Secondary | ICD-10-CM | POA: Diagnosis not present

## 2024-05-20 DIAGNOSIS — E559 Vitamin D deficiency, unspecified: Secondary | ICD-10-CM | POA: Diagnosis not present

## 2024-05-20 DIAGNOSIS — Z0000 Encounter for general adult medical examination without abnormal findings: Secondary | ICD-10-CM | POA: Diagnosis not present

## 2024-05-20 DIAGNOSIS — I10 Essential (primary) hypertension: Secondary | ICD-10-CM | POA: Diagnosis not present

## 2024-05-22 ENCOUNTER — Ambulatory Visit: Payer: Self-pay | Admitting: Family Medicine

## 2024-05-22 LAB — LIPID PANEL

## 2024-05-23 LAB — MICROALBUMIN / CREATININE URINE RATIO

## 2024-05-25 ENCOUNTER — Other Ambulatory Visit: Payer: Self-pay | Admitting: Family Medicine

## 2024-05-25 DIAGNOSIS — I10 Essential (primary) hypertension: Secondary | ICD-10-CM

## 2024-05-27 LAB — LIPID PANEL
Chol/HDL Ratio: 2.8 ratio (ref 0.0–4.4)
Cholesterol, Total: 124 mg/dL (ref 100–199)
HDL: 44 mg/dL (ref >39)
LDL Chol Calc (NIH): 67 mg/dL (ref 0–99)
Triglycerides: 60 mg/dL (ref 0–149)
VLDL Cholesterol Cal: 13 mg/dL (ref 5–40)

## 2024-05-27 LAB — SPECIMEN STATUS REPORT

## 2024-05-27 LAB — CMP14+EGFR
ALT: 7 IU/L (ref 0–32)
AST: 13 IU/L (ref 0–40)
Albumin: 4 g/dL (ref 3.9–4.9)
Alkaline Phosphatase: 81 IU/L (ref 49–135)
BUN/Creatinine Ratio: 9 — ABNORMAL LOW (ref 12–28)
BUN: 11 mg/dL (ref 8–27)
Bilirubin Total: 0.3 mg/dL (ref 0.0–1.2)
CO2: 26 mmol/L (ref 20–29)
Calcium: 9.1 mg/dL (ref 8.7–10.3)
Chloride: 105 mmol/L (ref 96–106)
Creatinine, Ser: 1.17 mg/dL — ABNORMAL HIGH (ref 0.57–1.00)
Globulin, Total: 2.6 g/dL (ref 1.5–4.5)
Glucose: 101 mg/dL — ABNORMAL HIGH (ref 70–99)
Potassium: 4.6 mmol/L (ref 3.5–5.2)
Sodium: 144 mmol/L (ref 134–144)
Total Protein: 6.6 g/dL (ref 6.0–8.5)
eGFR: 52 mL/min/1.73 — ABNORMAL LOW (ref >59)

## 2024-05-27 LAB — HEMOGLOBIN A1C
Est. average glucose Bld gHb Est-mCnc: 134 mg/dL
Hgb A1c MFr Bld: 6.3 % — ABNORMAL HIGH (ref 4.8–5.6)

## 2024-05-27 LAB — MICROALBUMIN / CREATININE URINE RATIO

## 2024-05-27 LAB — TSH: TSH: 1.77 u[IU]/mL (ref 0.450–4.500)

## 2024-05-27 LAB — VITAMIN D 25 HYDROXY (VIT D DEFICIENCY, FRACTURES): Vit D, 25-Hydroxy: 36.1 ng/mL (ref 30.0–100.0)

## 2024-05-31 ENCOUNTER — Other Ambulatory Visit: Payer: Self-pay | Admitting: Family Medicine

## 2024-06-15 ENCOUNTER — Other Ambulatory Visit: Payer: Self-pay | Admitting: Family Medicine

## 2024-06-16 ENCOUNTER — Other Ambulatory Visit: Payer: Self-pay | Admitting: Internal Medicine

## 2024-07-08 ENCOUNTER — Encounter

## 2024-07-15 ENCOUNTER — Other Ambulatory Visit: Payer: Self-pay | Admitting: Internal Medicine

## 2024-07-19 ENCOUNTER — Other Ambulatory Visit: Payer: Self-pay | Admitting: Family Medicine

## 2024-07-21 ENCOUNTER — Other Ambulatory Visit: Payer: Self-pay | Admitting: Family Medicine

## 2024-07-21 ENCOUNTER — Telehealth: Payer: Self-pay

## 2024-07-21 MED ORDER — TEMAZEPAM 30 MG PO CAPS: 30.0000 mg | ORAL_CAPSULE | Freq: Every day | ORAL | 5 refills | Status: AC

## 2024-07-21 NOTE — Addendum Note (Signed)
 Addended by: ANTONETTA ROLLENE BRAVO on: 07/21/2024 02:57 PM   Modules accepted: Orders

## 2024-07-21 NOTE — Telephone Encounter (Signed)
 Copied from CRM [id number]. Topic: Clinical - Prescription Issue >> Jul 21, 2024  2:25 PM Joesph B wrote: Reason for CRM: patient would like a update on temazepam  .

## 2024-07-24 ENCOUNTER — Other Ambulatory Visit: Payer: Self-pay | Admitting: Family Medicine

## 2024-07-29 ENCOUNTER — Other Ambulatory Visit: Payer: Self-pay | Admitting: Family Medicine

## 2024-07-29 DIAGNOSIS — E1169 Type 2 diabetes mellitus with other specified complication: Secondary | ICD-10-CM

## 2024-08-07 ENCOUNTER — Ambulatory Visit: Admitting: Family Medicine

## 2024-09-04 ENCOUNTER — Ambulatory Visit: Admitting: Family Medicine

## 2025-03-02 ENCOUNTER — Ambulatory Visit
